# Patient Record
Sex: Female | Born: 1970 | Race: Black or African American | Hispanic: No | Smoking: Never smoker
Health system: Southern US, Community
[De-identification: ages and names within clinical notes are randomized; demographics above are authoritative.]

## PROBLEM LIST (undated history)

## (undated) DIAGNOSIS — K8020 Calculus of gallbladder without cholecystitis without obstruction: Secondary | ICD-10-CM

## (undated) DIAGNOSIS — K859 Acute pancreatitis without necrosis or infection, unspecified: Secondary | ICD-10-CM

## (undated) DIAGNOSIS — M549 Dorsalgia, unspecified: Secondary | ICD-10-CM

## (undated) DIAGNOSIS — R569 Unspecified convulsions: Secondary | ICD-10-CM

## (undated) DIAGNOSIS — F32A Depression, unspecified: Secondary | ICD-10-CM

## (undated) DIAGNOSIS — G8929 Other chronic pain: Secondary | ICD-10-CM

## (undated) DIAGNOSIS — F319 Bipolar disorder, unspecified: Secondary | ICD-10-CM

## (undated) DIAGNOSIS — F4310 Post-traumatic stress disorder, unspecified: Secondary | ICD-10-CM

## (undated) DIAGNOSIS — F1190 Opioid use, unspecified, uncomplicated: Secondary | ICD-10-CM

## (undated) DIAGNOSIS — F329 Major depressive disorder, single episode, unspecified: Secondary | ICD-10-CM

## (undated) DIAGNOSIS — J45909 Unspecified asthma, uncomplicated: Secondary | ICD-10-CM

## (undated) HISTORY — DX: Opioid use, unspecified, uncomplicated: F11.90

## (undated) HISTORY — DX: Major depressive disorder, single episode, unspecified: F32.9

## (undated) HISTORY — DX: Depression, unspecified: F32.A

## (undated) HISTORY — DX: Unspecified convulsions: R56.9

## (undated) HISTORY — PX: CHOLECYSTECTOMY: SHX55

## (undated) HISTORY — PX: TUBAL LIGATION: SHX77

---

## 1998-12-30 HISTORY — PX: ELBOW SURGERY: SHX618

## 2004-09-05 ENCOUNTER — Other Ambulatory Visit: Payer: Self-pay

## 2005-06-09 ENCOUNTER — Emergency Department: Payer: Self-pay | Admitting: Emergency Medicine

## 2005-06-19 ENCOUNTER — Emergency Department: Payer: Self-pay | Admitting: Emergency Medicine

## 2005-07-09 ENCOUNTER — Ambulatory Visit: Payer: Self-pay

## 2005-08-26 ENCOUNTER — Emergency Department: Payer: Self-pay | Admitting: Emergency Medicine

## 2005-08-27 ENCOUNTER — Ambulatory Visit: Payer: Self-pay | Admitting: Emergency Medicine

## 2005-09-29 ENCOUNTER — Ambulatory Visit: Payer: Self-pay

## 2006-01-04 ENCOUNTER — Observation Stay: Payer: Self-pay | Admitting: Obstetrics and Gynecology

## 2006-01-27 ENCOUNTER — Inpatient Hospital Stay: Payer: Self-pay | Admitting: Obstetrics and Gynecology

## 2007-01-26 ENCOUNTER — Emergency Department: Payer: Self-pay | Admitting: Internal Medicine

## 2007-11-12 ENCOUNTER — Emergency Department: Payer: Self-pay

## 2007-11-15 ENCOUNTER — Emergency Department: Payer: Self-pay | Admitting: Emergency Medicine

## 2008-06-21 ENCOUNTER — Other Ambulatory Visit: Payer: Self-pay

## 2008-06-21 ENCOUNTER — Emergency Department: Payer: Self-pay

## 2008-12-15 ENCOUNTER — Emergency Department: Payer: Self-pay | Admitting: Emergency Medicine

## 2009-01-21 ENCOUNTER — Emergency Department: Payer: Self-pay | Admitting: Emergency Medicine

## 2009-02-24 ENCOUNTER — Emergency Department: Payer: Self-pay | Admitting: Emergency Medicine

## 2009-03-10 ENCOUNTER — Ambulatory Visit: Payer: Self-pay | Admitting: Rheumatology

## 2009-03-24 ENCOUNTER — Ambulatory Visit: Payer: Self-pay | Admitting: Orthopedic Surgery

## 2009-03-29 ENCOUNTER — Ambulatory Visit: Payer: Self-pay | Admitting: Orthopedic Surgery

## 2009-07-18 ENCOUNTER — Encounter: Payer: Self-pay | Admitting: Orthopedic Surgery

## 2009-07-30 ENCOUNTER — Encounter: Payer: Self-pay | Admitting: Orthopedic Surgery

## 2009-08-09 ENCOUNTER — Emergency Department: Payer: Self-pay | Admitting: Emergency Medicine

## 2009-08-12 ENCOUNTER — Emergency Department: Payer: Self-pay | Admitting: Emergency Medicine

## 2009-08-14 ENCOUNTER — Ambulatory Visit: Payer: Self-pay | Admitting: Vascular Surgery

## 2009-08-17 ENCOUNTER — Ambulatory Visit: Payer: Self-pay | Admitting: Vascular Surgery

## 2009-08-30 ENCOUNTER — Encounter: Payer: Self-pay | Admitting: Orthopedic Surgery

## 2010-06-09 ENCOUNTER — Emergency Department: Payer: Self-pay | Admitting: Emergency Medicine

## 2010-07-04 ENCOUNTER — Emergency Department: Payer: Self-pay | Admitting: Emergency Medicine

## 2010-10-31 ENCOUNTER — Emergency Department: Payer: Self-pay | Admitting: Emergency Medicine

## 2011-04-11 ENCOUNTER — Emergency Department: Payer: Self-pay | Admitting: Emergency Medicine

## 2012-01-23 ENCOUNTER — Emergency Department: Payer: Self-pay | Admitting: Unknown Physician Specialty

## 2012-01-23 LAB — CBC
HCT: 37.3 % (ref 35.0–47.0)
MCV: 91 fL (ref 80–100)
RBC: 4.09 10*6/uL (ref 3.80–5.20)
RDW: 14.6 % — ABNORMAL HIGH (ref 11.5–14.5)

## 2012-01-23 LAB — COMPREHENSIVE METABOLIC PANEL
Albumin: 3.8 g/dL (ref 3.4–5.0)
Alkaline Phosphatase: 55 U/L (ref 50–136)
BUN: 6 mg/dL — ABNORMAL LOW (ref 7–18)
Chloride: 106 mmol/L (ref 98–107)
Creatinine: 0.77 mg/dL (ref 0.60–1.30)
Glucose: 90 mg/dL (ref 65–99)
Osmolality: 280 (ref 275–301)
Potassium: 3.7 mmol/L (ref 3.5–5.1)
SGPT (ALT): 20 U/L
Sodium: 142 mmol/L (ref 136–145)
Total Protein: 7.7 g/dL (ref 6.4–8.2)

## 2012-01-23 LAB — URINALYSIS, COMPLETE
Bilirubin,UR: NEGATIVE
Ketone: NEGATIVE
Leukocyte Esterase: NEGATIVE
Nitrite: NEGATIVE
Ph: 7 (ref 4.5–8.0)
Protein: NEGATIVE
RBC,UR: 3 /HPF (ref 0–5)
WBC UR: 1 /HPF (ref 0–5)

## 2012-01-23 LAB — LIPASE, BLOOD: Lipase: 88 U/L (ref 73–393)

## 2012-01-23 LAB — PREGNANCY, URINE: Pregnancy Test, Urine: NEGATIVE m[IU]/mL

## 2012-01-23 LAB — MAGNESIUM: Magnesium: 1.7 mg/dL — ABNORMAL LOW

## 2012-12-28 ENCOUNTER — Emergency Department: Payer: Self-pay | Admitting: Emergency Medicine

## 2013-05-07 ENCOUNTER — Emergency Department: Payer: Self-pay | Admitting: Emergency Medicine

## 2013-05-18 ENCOUNTER — Emergency Department: Payer: Self-pay | Admitting: Emergency Medicine

## 2013-06-12 ENCOUNTER — Emergency Department: Payer: Self-pay | Admitting: Internal Medicine

## 2013-08-15 ENCOUNTER — Emergency Department: Payer: Self-pay | Admitting: Emergency Medicine

## 2013-08-16 ENCOUNTER — Ambulatory Visit: Payer: Self-pay | Admitting: Primary Care

## 2013-09-07 ENCOUNTER — Ambulatory Visit: Payer: Self-pay | Admitting: Primary Care

## 2013-09-08 ENCOUNTER — Emergency Department: Payer: Self-pay | Admitting: Emergency Medicine

## 2013-09-08 LAB — URINALYSIS, COMPLETE
Bacteria: NONE SEEN
Bilirubin,UR: NEGATIVE
Glucose,UR: NEGATIVE mg/dL
Leukocyte Esterase: NEGATIVE
Nitrite: NEGATIVE
Ph: 6
Protein: 30
RBC,UR: 9 /HPF
Specific Gravity: 1.032
Squamous Epithelial: 2
WBC UR: 1 /HPF

## 2013-10-11 ENCOUNTER — Emergency Department: Payer: Self-pay | Admitting: Emergency Medicine

## 2013-10-11 LAB — COMPREHENSIVE METABOLIC PANEL
Alkaline Phosphatase: 83 U/L (ref 50–136)
Anion Gap: 7 (ref 7–16)
Bilirubin,Total: 0.5 mg/dL (ref 0.2–1.0)
Calcium, Total: 10.2 mg/dL — ABNORMAL HIGH (ref 8.5–10.1)
Chloride: 102 mmol/L (ref 98–107)
Co2: 27 mmol/L (ref 21–32)
Creatinine: 0.78 mg/dL (ref 0.60–1.30)
Glucose: 94 mg/dL (ref 65–99)
Potassium: 3.5 mmol/L (ref 3.5–5.1)
SGOT(AST): 23 U/L (ref 15–37)
Sodium: 136 mmol/L (ref 136–145)
Total Protein: 8.4 g/dL — ABNORMAL HIGH (ref 6.4–8.2)

## 2013-10-11 LAB — CBC
HCT: 38.4 % (ref 35.0–47.0)
MCHC: 34.4 g/dL (ref 32.0–36.0)
MCV: 89 fL (ref 80–100)
Platelet: 424 10*3/uL (ref 150–440)
RBC: 4.31 10*6/uL (ref 3.80–5.20)
RDW: 15.2 % — ABNORMAL HIGH (ref 11.5–14.5)
WBC: 8.5 10*3/uL (ref 3.6–11.0)

## 2013-10-11 LAB — URINALYSIS, COMPLETE
Leukocyte Esterase: NEGATIVE
Nitrite: NEGATIVE
Protein: 30
RBC,UR: 8 /HPF (ref 0–5)
WBC UR: 4 /HPF (ref 0–5)

## 2013-10-12 LAB — TROPONIN I: Troponin-I: <0.02 ng/mL

## 2013-10-13 LAB — URINE CULTURE

## 2013-10-14 ENCOUNTER — Ambulatory Visit: Payer: Self-pay | Admitting: Physical Medicine and Rehabilitation

## 2014-03-24 ENCOUNTER — Emergency Department: Payer: Self-pay | Admitting: Emergency Medicine

## 2014-03-24 LAB — COMPREHENSIVE METABOLIC PANEL
ALK PHOS: 57 U/L
ALT: 16 U/L (ref 12–78)
AST: 10 U/L — AB (ref 15–37)
Albumin: 3.9 g/dL (ref 3.4–5.0)
Anion Gap: 4 — ABNORMAL LOW (ref 7–16)
BILIRUBIN TOTAL: 0.4 mg/dL (ref 0.2–1.0)
BUN: 9 mg/dL (ref 7–18)
CALCIUM: 9.3 mg/dL (ref 8.5–10.1)
CHLORIDE: 103 mmol/L (ref 98–107)
Co2: 27 mmol/L (ref 21–32)
Creatinine: 0.89 mg/dL (ref 0.60–1.30)
EGFR (African American): >60
EGFR (Non-African Amer.): >60
GLUCOSE: 88 mg/dL (ref 65–99)
OSMOLALITY: 266 (ref 275–301)
POTASSIUM: 3.4 mmol/L — AB (ref 3.5–5.1)
SODIUM: 134 mmol/L — AB (ref 136–145)
TOTAL PROTEIN: 7.9 g/dL (ref 6.4–8.2)

## 2014-03-24 LAB — URINALYSIS, COMPLETE
Bilirubin,UR: NEGATIVE
Glucose,UR: NEGATIVE mg/dL (ref 0–75)
KETONE: NEGATIVE
Nitrite: NEGATIVE
Ph: 7 (ref 4.5–8.0)
Protein: NEGATIVE
RBC,UR: 1 /HPF (ref 0–5)
Specific Gravity: 1.004 (ref 1.003–1.030)
Squamous Epithelial: <1

## 2014-03-24 LAB — CBC
HCT: 36.7 % (ref 35.0–47.0)
HGB: 12.2 g/dL (ref 12.0–16.0)
MCH: 29.6 pg (ref 26.0–34.0)
MCHC: 33.2 g/dL (ref 32.0–36.0)
MCV: 89 fL (ref 80–100)
Platelet: 341 10*3/uL (ref 150–440)
RBC: 4.13 10*6/uL (ref 3.80–5.20)
RDW: 15.3 % — ABNORMAL HIGH (ref 11.5–14.5)
WBC: 7.5 10*3/uL (ref 3.6–11.0)

## 2014-12-08 ENCOUNTER — Emergency Department: Payer: Self-pay | Admitting: Student

## 2015-02-22 ENCOUNTER — Emergency Department: Payer: Self-pay | Admitting: Emergency Medicine

## 2015-06-22 ENCOUNTER — Other Ambulatory Visit: Payer: Self-pay | Admitting: Physician Assistant

## 2015-06-22 DIAGNOSIS — N643 Galactorrhea not associated with childbirth: Secondary | ICD-10-CM

## 2015-06-28 ENCOUNTER — Ambulatory Visit: Payer: Medicaid Other

## 2015-06-28 ENCOUNTER — Ambulatory Visit
Admission: RE | Admit: 2015-06-28 | Discharge: 2015-06-28 | Disposition: A | Discharge status: Home / Self Care | Payer: Medicaid Other | Source: Ambulatory Visit | Attending: Physician Assistant | Admitting: Physician Assistant

## 2015-06-28 ENCOUNTER — Ambulatory Visit: Payer: Self-pay

## 2015-06-28 DIAGNOSIS — N644 Mastodynia: Secondary | ICD-10-CM | POA: Insufficient documentation

## 2015-06-28 DIAGNOSIS — N643 Galactorrhea not associated with childbirth: Secondary | ICD-10-CM

## 2015-06-28 DIAGNOSIS — O926 Galactorrhea: Secondary | ICD-10-CM | POA: Diagnosis present

## 2015-07-01 ENCOUNTER — Encounter: Payer: Self-pay | Admitting: Emergency Medicine

## 2015-07-01 ENCOUNTER — Emergency Department
Admission: EM | Admit: 2015-07-01 | Discharge: 2015-07-01 | Disposition: A | Discharge status: Home / Self Care | Payer: Medicaid Other | Attending: Emergency Medicine | Admitting: Emergency Medicine

## 2015-07-01 DIAGNOSIS — F319 Bipolar disorder, unspecified: Secondary | ICD-10-CM | POA: Diagnosis not present

## 2015-07-01 DIAGNOSIS — Z79899 Other long term (current) drug therapy: Secondary | ICD-10-CM | POA: Insufficient documentation

## 2015-07-01 DIAGNOSIS — R51 Headache: Secondary | ICD-10-CM | POA: Insufficient documentation

## 2015-07-01 DIAGNOSIS — R519 Headache, unspecified: Secondary | ICD-10-CM

## 2015-07-01 DIAGNOSIS — Z880 Allergy status to penicillin: Secondary | ICD-10-CM | POA: Insufficient documentation

## 2015-07-01 DIAGNOSIS — F4310 Post-traumatic stress disorder, unspecified: Secondary | ICD-10-CM | POA: Diagnosis not present

## 2015-07-01 HISTORY — DX: Post-traumatic stress disorder, unspecified: F43.10

## 2015-07-01 HISTORY — DX: Bipolar disorder, unspecified: F31.9

## 2015-07-01 MED ORDER — BUTALBITAL-APAP-CAFFEINE 50-325-40 MG PO TABS
ORAL_TABLET | ORAL | Status: AC
  Administered 2015-07-01: 1 via ORAL
  Filled 2015-07-01: qty 1

## 2015-07-01 MED ORDER — KETOROLAC TROMETHAMINE 60 MG/2ML IM SOLN
60.0000 mg | Freq: Once | INTRAMUSCULAR | Status: AC
  Administered 2015-07-01: 60 mg via INTRAMUSCULAR

## 2015-07-01 MED ORDER — KETOROLAC TROMETHAMINE 60 MG/2ML IM SOLN
INTRAMUSCULAR | Status: AC
  Administered 2015-07-01: 60 mg via INTRAMUSCULAR
  Filled 2015-07-01: qty 2

## 2015-07-01 MED ORDER — BUTALBITAL-APAP-CAFFEINE 50-325-40 MG PO TABS
1.0000 | ORAL_TABLET | Freq: Four times a day (QID) | ORAL | Status: AC | PRN
Start: 2015-07-01 — End: 2016-06-30

## 2015-07-01 MED ORDER — KETOROLAC TROMETHAMINE 10 MG PO TABS: 10.0000 mg | ORAL_TABLET | Freq: Four times a day (QID) | ORAL | Status: DC | PRN

## 2015-07-01 MED ORDER — BUTALBITAL-APAP-CAFFEINE 50-325-40 MG PO TABS
1.0000 | ORAL_TABLET | Freq: Once | ORAL | Status: AC
  Administered 2015-07-01: 1 via ORAL

## 2015-07-01 NOTE — ED Notes (Signed)
NAD noted at time of D/C. Pt denies questions or concerns. Pt ambulatory to the lobby at this time. Pt refused wheelchair at this time.  

## 2015-07-01 NOTE — ED Notes (Signed)
Patient presents to the ED with a headache for 2 hours.  Patient states about 1.5 hours ago she noticed 2 red ticks on her and she pulled them off her right arm.  Patient is concerned that ticks and headache are related.  Patient is slightly anxious but is ambulatory and oriented x 4.

## 2015-07-01 NOTE — Discharge Instructions (Signed)
Follow up with your PCP if the headaches continue. Return to the ER for symptoms that change or worsen if you are unable to schedule an appointment.

## 2015-07-01 NOTE — ED Provider Notes (Signed)
[facility] Emergency Department Provider Note ___________________________________________  Time seen: Approximately 4:00 PM  I have reviewed the triage vital signs and the nursing notes.   HISTORY  Chief Complaint Headache  HPI Sierra Wiley is a 44 y.o. female who presents to the emergency department for a headache over the past 2 hours. She states that they were walking in the woods to go fishing and she noticed a red tick on her right forearm that had just started to embed underneath the skin. She states a second take was removed from her left lower leg that had embedded itself. She reports that she has never had a headache like this in the past. The headache is in the front and feels band like.She reports a recent change in her medications. She was taken off of her Remeron and placed on temazepam and Abilify. She states that her first dose of the temazepam was last night. She denies change in vision. She denies confusion. She denies nausea or vomiting.   Past Medical History  Diagnosis Date  . Bipolar 1 disorder   . PTSD (post-traumatic stress disorder)     Patient Active Problem List   Diagnosis Date Noted  . Bipolar 1 disorder 07/01/2015  . Post traumatic stress disorder 07/01/2015    History reviewed. Wiley pertinent past surgical history.  Current Outpatient Rx  Name  Route  Sig  Dispense  Refill  . ARIPiprazole (ABILIFY) 10 MG tablet   Oral   Take 10 mg by mouth daily.         . citalopram (CELEXA) 10 MG tablet   Oral   Take 10 mg by mouth daily.         . temazepam (RESTORIL) 15 MG capsule   Oral   Take 15 mg by mouth at bedtime as needed for sleep.         . butalbital-acetaminophen-caffeine (FIORICET) 50-325-40 MG per tablet   Oral   Take 1 tablet by mouth every 6 (six) hours as needed for headache.   6 tablet   0   . ketorolac (TORADOL) 10 MG tablet   Oral   Take 1 tablet (10 mg total) by mouth every 6 (six) hours as  needed.   20 tablet   0     Allergies Penicillins  Wiley family history on file.  Social History History  Substance Use Topics  . Smoking status: Not on file  . Smokeless tobacco: Not on file  . Alcohol Use: Not on file    Review of Systems Constitutional: Wiley fever/chills Eyes: Wiley visual changes. ENT: Wiley sore throat. Cardiovascular: Denies chest pain. Respiratory: Denies shortness of breath. Gastrointestinal: Wiley abdominal pain.  Wiley nausea, Wiley vomiting.  Wiley diarrhea.  Wiley constipation. Genitourinary: Negative for dysuria. Musculoskeletal: Negative for back pain. Skin: Negative for rash. Neurological: Negative for headaches, focal weakness or numbness.  10-point ROS otherwise negative.  ____________________________________________   PHYSICAL EXAM:  VITAL SIGNS: ED Triage Vitals  Enc Vitals Group     BP 07/01/15 1528 123/82 mmHg     Pulse Rate 07/01/15 1528 73     Resp 07/01/15 1528 16     Temp 07/01/15 1528 98.6 F (37 C)     Temp Source 07/01/15 1528 Oral     SpO2 07/01/15 1528 98 %     Weight 07/01/15 1528 166 lb (75.297 kg)     Height 07/01/15 1528  (1.6 m)     Head Cir --  Peak Flow --      Pain Score 07/01/15 1530 10     Pain Loc --      Pain Edu? --      Excl. in GC? --     Constitutional: Alert and oriented. Well appearing and in Wiley acute distress. Eyes: Conjunctivae are normal. PERRL. EOMI. Head: Atraumatic. Nose: Wiley congestion/rhinnorhea. Mouth/Throat: Mucous membranes are moist.  Oropharynx non-erythematous. Neck: Wiley stridor.   Cardiovascular: Normal rate, regular rhythm. Grossly normal heart sounds.  Good peripheral circulation. Respiratory: Normal respiratory effort.  Wiley retractions. Lungs CTAB. Gastrointestinal: Soft and nontender. Wiley distention. Wiley abdominal bruits. Wiley CVA tenderness. Musculoskeletal: Wiley lower extremity tenderness nor edema.  Wiley joint effusions. Neurologic:  Normal speech and language. Wiley gross focal neurologic  deficits are appreciated. Speech is normal. Wiley gait instability. Romberg negative, finger to nose test negative. Skin:  Skin is warm and dry. Wiley rash noted. Pinpoint abrasion present on right forearm where she "pulled the tick out before it got all the way in."  Psychiatric: Flat affect. Speech and behavior are normal.  ____________________________________________   LABS (all labs ordered are listed, but only abnormal results are displayed)  Labs Reviewed - Wiley data to display ____________________________________________  EKG   ____________________________________________  RADIOLOGY  Not indicated. ____________________________________________   PROCEDURES  Procedure(s) performed: None  Critical Care performed: Wiley  ____________________________________________   INITIAL IMPRESSION / ASSESSMENT AND PLAN / ED COURSE  Pertinent labs & imaging results that were available during my care of the patient were reviewed by me and considered in my medical decision making (see chart for details).  IM Toradol and PO Fioricet given in ER with complete relief of headache. She was advised to follow up with her provider if the headaches continue/return. Possibly related to recent changes in medications/new medications. ____________________________________________   FINAL CLINICAL IMPRESSION(S) / ED DIAGNOSES  Final diagnoses:  Frontal headache      Chinita PesterCari B Phares Zaccone, FNP 07/01/15 1701  Arnaldo NatalPaul F Malinda, MD 07/02/15 0005

## 2015-09-02 ENCOUNTER — Encounter: Payer: Self-pay | Admitting: *Deleted

## 2015-09-02 ENCOUNTER — Emergency Department
Admission: EM | Admit: 2015-09-02 | Discharge: 2015-09-02 | Disposition: A | Discharge status: Home / Self Care | Payer: Medicaid Other | Attending: Emergency Medicine | Admitting: Emergency Medicine

## 2015-09-02 DIAGNOSIS — M5137 Other intervertebral disc degeneration, lumbosacral region: Secondary | ICD-10-CM | POA: Diagnosis not present

## 2015-09-02 DIAGNOSIS — M545 Low back pain: Secondary | ICD-10-CM | POA: Insufficient documentation

## 2015-09-02 DIAGNOSIS — Z79899 Other long term (current) drug therapy: Secondary | ICD-10-CM | POA: Diagnosis not present

## 2015-09-02 DIAGNOSIS — Z880 Allergy status to penicillin: Secondary | ICD-10-CM | POA: Diagnosis not present

## 2015-09-02 DIAGNOSIS — G8929 Other chronic pain: Secondary | ICD-10-CM | POA: Diagnosis not present

## 2015-09-02 DIAGNOSIS — M5416 Radiculopathy, lumbar region: Secondary | ICD-10-CM

## 2015-09-02 HISTORY — DX: Unspecified asthma, uncomplicated: J45.909

## 2015-09-02 MED ORDER — KETOROLAC TROMETHAMINE 10 MG PO TABS: 10.0000 mg | ORAL_TABLET | Freq: Three times a day (TID) | ORAL | Status: DC

## 2015-09-02 MED ORDER — ORPHENADRINE CITRATE 30 MG/ML IJ SOLN
60.0000 mg | INTRAMUSCULAR | Status: AC
  Administered 2015-09-02: 60 mg via INTRAMUSCULAR
  Filled 2015-09-02: qty 2

## 2015-09-02 MED ORDER — KETOROLAC TROMETHAMINE 60 MG/2ML IM SOLN
60.0000 mg | Freq: Once | INTRAMUSCULAR | Status: AC
  Administered 2015-09-02: 60 mg via INTRAMUSCULAR
  Filled 2015-09-02: qty 2

## 2015-09-02 NOTE — ED Notes (Signed)
Pt states she has a bad lower back with buldging discs. Pt states after moving a tv yesterday she now has severe pain in her lower back

## 2015-09-02 NOTE — Discharge Instructions (Signed)
Chronic Back Pain  When back pain lasts longer than 3 months, it is called chronic back pain.People with chronic back pain often go through certain periods that are more intense (flare-ups).  CAUSES Chronic back pain can be caused by wear and tear (degeneration) on different structures in your back. These structures include:  The bones of your spine (vertebrae) and the joints surrounding your spinal cord and nerve roots (facets).  The strong, fibrous tissues that connect your vertebrae (ligaments). Degeneration of these structures may result in pressure on your nerves. This can lead to constant pain. HOME CARE INSTRUCTIONS  Avoid bending, heavy lifting, prolonged sitting, and activities which make the problem worse.  Take brief periods of rest throughout the day to reduce your pain. Lying down or standing usually is better than sitting while you are resting.  Take over-the-counter or prescription medicines only as directed by your caregiver. SEEK IMMEDIATE MEDICAL CARE IF:   You have weakness or numbness in one of your legs or feet.  You have trouble controlling your bladder or bowels.  You have nausea, vomiting, abdominal pain, shortness of breath, or fainting. Document Released: 01/23/2005 Document Revised: 03/09/2012 Document Reviewed: 11/30/2011 [facility] Patient Information 2015 [place], [place]. This information is not intended to replace advice given to you by your health care provider. Make sure you discuss any questions you have with your health care provider.  Lumbosacral Radiculopathy Lumbosacral radiculopathy is a pinched nerve or nerves in the low back (lumbosacral area). When this happens you may have weakness in your legs and may not be able to stand on your toes. You may have pain going down into your legs. There may be difficulties with walking normally. There are many causes of this problem. Sometimes this may happen from an injury, or simply from arthritis or boney  problems. It may also be caused by other illnesses such as diabetes. If there is no improvement after treatment, further studies may be done to find the exact cause. DIAGNOSIS  X-rays may be needed if the problems become long standing. Electromyograms may be done. This study is one in which the working of nerves and muscles is studied. HOME CARE INSTRUCTIONS   Applications of ice packs may be helpful. Ice can be used in a plastic bag with a towel around it to prevent frostbite to skin. This may be used every 2 hours for 20 to 30 minutes, or as needed, while awake, or as directed by your caregiver.  Only take over-the-counter or prescription medicines for pain, discomfort, or fever as directed by your caregiver.  If physical therapy was prescribed, follow your caregiver's directions. SEEK IMMEDIATE MEDICAL CARE IF:   You have pain not controlled with medications.  You seem to be getting worse rather than better.  You develop increasing weakness in your legs.  You develop loss of bowel or bladder control.  You have difficulty with walking or balance, or develop clumsiness in the use of your legs.  You have a fever. MAKE SURE YOU:   Understand these instructions.  Will watch your condition.  Will get help right away if you are not doing well or get worse. Document Released: 12/16/2005 Document Revised: 03/09/2012 Document Reviewed: 08/05/2008 [facility] Patient Information 2015 [place], [place]. This information is not intended to replace advice given to you by your health care provider. Make sure you discuss any questions you have with your health care provider.  Continue your home meds as directed.  Follow-up with [facility] as needed.

## 2015-09-02 NOTE — ED Provider Notes (Signed)
[facility] Emergency Department Provider Note ____________________________________________  Time seen: 1322  I have reviewed the triage vital signs and the nursing notes.  HISTORY  Chief Complaint  Back Pain  HPI Sierra Wiley is a 44 y.o. female since to the ED for evaluation of acute back pain on top of her chronic lumbar degenerative disc disease, after moving a television yesterday. She describes some low back pain which is severe in nature.She is here primarily because she is awaiting a referral to a pain management specialist in [place] who takes her insurance. That referral is being managed by [facility]. In the interim she has been managed by her psychiatrist with her home meds including tizanidine. She denies any bladder or bowel incontinence, but describes pain to the right lower extremity and in the left hip which is her baseline.  Past Medical History  Diagnosis Date  . Bipolar 1 disorder   . PTSD (post-traumatic stress disorder)   . Asthma     Patient Active Problem List   Diagnosis Date Noted  . Bipolar 1 disorder 07/01/2015  . Post traumatic stress disorder 07/01/2015    No past surgical history on file.  Current Outpatient Rx  Name  Route  Sig  Dispense  Refill  . ARIPiprazole (ABILIFY) 10 MG tablet   Oral   Take 10 mg by mouth daily.         . butalbital-acetaminophen-caffeine (FIORICET) 50-325-40 MG per tablet   Oral   Take 1 tablet by mouth every 6 (six) hours as needed for headache.   6 tablet   0   . citalopram (CELEXA) 10 MG tablet   Oral   Take 10 mg by mouth daily.         Marland Kitchen ketorolac (TORADOL) 10 MG tablet   Oral   Take 1 tablet (10 mg total) by mouth every 8 (eight) hours.   15 tablet   0   . temazepam (RESTORIL) 15 MG capsule   Oral   Take 15 mg by mouth at bedtime as needed for sleep.          Allergies Penicillins  No family history on file.  Social History Social History   Substance Use Topics  . Smoking status: Former Games developer  . Smokeless tobacco: None  . Alcohol Use: No   Review of Systems  Constitutional: Negative for fever. Eyes: Negative for visual changes. ENT: Negative for sore throat. Cardiovascular: Negative for chest pain. Respiratory: Negative for shortness of breath. Gastrointestinal: Negative for abdominal pain, vomiting and diarrhea. Genitourinary: Negative for dysuria. Musculoskeletal: Positive for back pain as above Skin: Negative for rash. Neurological: Negative for headaches, focal weakness or numbness. ____________________________________________  PHYSICAL EXAM:  VITAL SIGNS: ED Triage Vitals  Enc Vitals Group     BP 09/02/15 1304 136/81 mmHg     Pulse Rate 09/02/15 1304 84     Resp 09/02/15 1304 17     Temp 09/02/15 1304 97.8 F (36.6 C)     Temp Source 09/02/15 1304 Oral     SpO2 09/02/15 1304 100 %     Weight 09/02/15 1304 178 lb (80.74 kg)     Height 09/02/15 1304 5\' 2"  (1.575 m)     Head Cir --      Peak Flow --      Pain Score 09/02/15 1305 9     Pain Loc --      Pain Edu? --      Excl.  in GC? --    Constitutional: Alert and oriented. Well appearing and in no distress. Eyes: Conjunctivae are normal. PERRL. Normal extraocular movements. ENT   Head: Normocephalic and atraumatic.   Nose: No congestion/rhinnorhea.   Mouth/Throat: Mucous membranes are moist.   Neck: Supple. No thyromegaly. Hematological/Lymphatic/Immunilogical: No cervical lymphadenopathy. Cardiovascular: Normal rate, regular rhythm.  Respiratory: Normal respiratory effort. No wheezes/rales/rhonchi. Gastrointestinal: Soft and nontender. No distention. Musculoskeletal: Normal spinal alignment without spasm, deformity, or step-off. Nontender with normal range of motion in all extremities.  Neurologic:  CN II-XII grossly intact. Normal LE DTRs bilaterally. Normal gait without ataxia. Normal speech and language. No gross focal neurologic  deficits are appreciated. Skin:  Skin is warm, dry and intact. No rash noted. Psychiatric: Mood and affect are normal. Patient exhibits appropriate insight and judgment. __________________________   RADIOLOGY MRI (07/2013) L4-5 HNP with annular bulge L5-S1 HNP  ____________________________________________  PROCEDURES  Toradol 60 mg IM Norflex 60 mg IM ____________________________________________  INITIAL IMPRESSION / ASSESSMENT AND PLAN / ED COURSE  Patient reports with a flare of her chronic back pain over the last she had but most recently the last few weeks. She is awaiting a referral to pain specialist for further management of her symptoms. She will be treated with Toradol # 15 addition to her previously prescribed home meds. She'll follow-up at [facility] clinic as needed or return to the ED for worsening symptoms. ____________________________________________  FINAL CLINICAL IMPRESSION(S) / ED DIAGNOSES  Final diagnoses:  DDD (degenerative disc disease), lumbosacral  Chronic radicular pain of lower back     Lissa Hoard, PA-C 09/02/15 1524  Myrna Blazer, MD 09/02/15 [phone]

## 2015-10-04 ENCOUNTER — Emergency Department
Admission: EM | Admit: 2015-10-04 | Discharge: 2015-10-04 | Disposition: A | Discharge status: Home / Self Care | Payer: Medicaid Other | Attending: Emergency Medicine | Admitting: Emergency Medicine

## 2015-10-04 ENCOUNTER — Encounter: Payer: Self-pay | Admitting: Emergency Medicine

## 2015-10-04 ENCOUNTER — Emergency Department: Payer: Medicaid Other

## 2015-10-04 DIAGNOSIS — Z87891 Personal history of nicotine dependence: Secondary | ICD-10-CM | POA: Insufficient documentation

## 2015-10-04 DIAGNOSIS — Y998 Other external cause status: Secondary | ICD-10-CM | POA: Insufficient documentation

## 2015-10-04 DIAGNOSIS — Z880 Allergy status to penicillin: Secondary | ICD-10-CM | POA: Diagnosis not present

## 2015-10-04 DIAGNOSIS — Y9285 Railroad track as the place of occurrence of the external cause: Secondary | ICD-10-CM | POA: Insufficient documentation

## 2015-10-04 DIAGNOSIS — Z79899 Other long term (current) drug therapy: Secondary | ICD-10-CM | POA: Diagnosis not present

## 2015-10-04 DIAGNOSIS — W010XXA Fall on same level from slipping, tripping and stumbling without subsequent striking against object, initial encounter: Secondary | ICD-10-CM | POA: Diagnosis not present

## 2015-10-04 DIAGNOSIS — Y9389 Activity, other specified: Secondary | ICD-10-CM | POA: Insufficient documentation

## 2015-10-04 DIAGNOSIS — S8001XA Contusion of right knee, initial encounter: Secondary | ICD-10-CM

## 2015-10-04 DIAGNOSIS — S5001XA Contusion of right elbow, initial encounter: Secondary | ICD-10-CM | POA: Diagnosis not present

## 2015-10-04 DIAGNOSIS — S59901A Unspecified injury of right elbow, initial encounter: Secondary | ICD-10-CM | POA: Diagnosis present

## 2015-10-04 DIAGNOSIS — S8002XA Contusion of left knee, initial encounter: Secondary | ICD-10-CM | POA: Diagnosis not present

## 2015-10-04 MED ORDER — MELOXICAM 15 MG PO TABS: 15.0000 mg | ORAL_TABLET | Freq: Every day | ORAL | Status: DC

## 2015-10-04 NOTE — ED Provider Notes (Signed)
[facility] Emergency Department Provider Note ____________________________________________  Time seen: Approximately 4:12 PM  I have reviewed the triage vital signs and the nursing notes.   HISTORY  Chief Complaint Fall   HPI Sierra Wiley is a 44 y.o. female who presents to the emergency department for evaluation of right elbow and knee pain after [place] earlier [place]. She states she tripped because of her shoe. She has not taken anything for pain.   Past Medical History  Diagnosis Date  . Bipolar 1 disorder (HCC)   . PTSD (post-traumatic stress disorder)   . Asthma     Patient Active Problem List   Diagnosis Date Noted  . Bipolar 1 disorder (HCC) 07/01/2015  . Post traumatic stress disorder 07/01/2015    History reviewed. No pertinent past surgical history.  Current Outpatient Rx  Name  Route  Sig  Dispense  Refill  . clonazePAM (KLONOPIN) 0.5 MG tablet   Oral   Take 0.5 mg by mouth 2 (two) times daily as needed for anxiety.         . lamoTRIgine (LAMICTAL) 100 MG tablet   Oral   Take 100 mg by mouth daily.         Marland Kitchen lurasidone (LATUDA) 40 MG TABS tablet   Oral   Take 20 mg by mouth daily with breakfast.         . ARIPiprazole (ABILIFY) 10 MG tablet   Oral   Take 10 mg by mouth daily.         . butalbital-acetaminophen-caffeine (FIORICET) 50-325-40 MG per tablet   Oral   Take 1 tablet by mouth every 6 (six) hours as needed for headache.   6 tablet   0   . citalopram (CELEXA) 10 MG tablet   Oral   Take 10 mg by mouth daily.         . meloxicam (MOBIC) 15 MG tablet   Oral   Take 1 tablet (15 mg total) by mouth daily.   30 tablet   2   . temazepam (RESTORIL) 15 MG capsule   Oral   Take 15 mg by mouth at bedtime as needed for sleep.           Allergies Penicillins  History reviewed. No pertinent family history.  Social History Social History  Substance Use Topics  . Smoking  status: Former Games developer  . Smokeless tobacco: None  . Alcohol Use: No    Review of Systems Constitutional: No recent illness. Eyes: No visual changes. ENT: No sore throat. Cardiovascular: Denies chest pain or palpitations. Respiratory: Denies shortness of breath. Gastrointestinal: No abdominal pain.  Genitourinary: Negative for dysuria. Musculoskeletal: Pain in right elbow over the radial head. Skin: Negative for rash. Neurological: Negative for headaches, focal weakness or numbness. 10-point ROS otherwise negative.  ____________________________________________   PHYSICAL EXAM:  VITAL SIGNS: ED Triage Vitals  Enc Vitals Group     BP 10/04/15 1031 114/73 mmHg     Pulse Rate 10/04/15 1031 90     Resp 10/04/15 1031 20     Temp 10/04/15 1031 98 F (36.7 C)     Temp Source 10/04/15 1031 Oral     SpO2 10/04/15 1031 97 %     Weight 10/04/15 1031 185 lb (83.915 kg)     Height 10/04/15 1031  (1.575 m)     Head Cir --      Peak Flow --      Pain  Score 10/04/15 1032 8     Pain Loc --      Pain Edu? --      Excl. in GC? --     Constitutional: Alert and oriented. Well appearing and in no acute distress. Eyes: Conjunctivae are normal. EOMI. Head: Atraumatic. Nose: No congestion/rhinnorhea. Neck: No stridor.  Respiratory: Normal respiratory effort.   Musculoskeletal: Tenderness to palpation of the right elbow and anterior knees. Neurologic:  Normal speech and language. No gross focal neurologic deficits are appreciated. Speech is normal. No gait instability. Skin:  Skin is warm, dry and intact. Atraumatic.  Psychiatric: Mood and affect are normal. Speech and behavior are normal.  ____________________________________________   LABS (all labs ordered are listed, but only abnormal results are displayed)  Labs Reviewed - No data to display ____________________________________________  RADIOLOGY  Negative for fracture of the right  elbow. ____________________________________________   PROCEDURES  Procedure(s) performed:  Sling applied to the right arm.   ____________________________________________   INITIAL IMPRESSION / ASSESSMENT AND PLAN / ED COURSE  Pertinent labs & imaging results that were available during my care of the patient were reviewed by me and considered in my medical decision making (see chart for details).  Patient was advised to follow up with orthopedics for symptoms that are not iimproving over the next week. She was advised to return to the ER for symptoms that change or worsen if unable to schedule an appointment. ____________________________________________   FINAL CLINICAL IMPRESSION(S) / ED DIAGNOSES  Final diagnoses:  Elbow contusion, right, initial encounter  Knee contusion, left, initial encounter  Knee contusion, right, initial encounter       Chinita Pester, FNP 10/04/15 1619  Myrna Blazer, MD 10/04/15 [phone]

## 2015-10-04 NOTE — ED Notes (Signed)
Pt states fell on some railroad tracks this am injuring her right wrist and both elbows with an abrasion to right palm of hand with bandaid in place.

## 2015-10-04 NOTE — ED Notes (Signed)
NAD noted at time of D/C. Pt taken to lobby via wheelchair. Pt denies comments/concerns at this time.  

## 2015-10-04 NOTE — Discharge Instructions (Signed)
Elbow Contusion °An elbow contusion is a deep bruise of the elbow. Contusions are the result of an injury that caused bleeding under the skin. The contusion may turn blue, purple, or yellow. Minor injuries will give you a painless contusion, but more severe contusions may stay painful and swollen for a few weeks.  °CAUSES  °An elbow contusion comes from a direct force to that area, such as falling on the elbow. °SYMPTOMS  °· Swelling and redness of the elbow. °· Bruising of the elbow area. °· Tenderness or soreness of the elbow. °DIAGNOSIS  °You will have a physical exam and will be asked about your history. You may need an X-ray of your elbow to look for a broken bone (fracture).  °TREATMENT  °A sling or splint may be needed to support your injury. Resting, elevating, and applying cold compresses to the elbow area are often the best treatments for an elbow contusion. Over-the-counter medicines may also be recommended for pain control. °HOME CARE INSTRUCTIONS  °· Put ice on the injured area. °¨ Put ice in a plastic bag. °¨ Place a towel between your skin and the bag. °¨ Leave the ice on for 15-20 minutes, 03-04 times a day. °· Only take over-the-counter or prescription medicines for pain, discomfort, or fever as directed by your caregiver. °· Rest your injured elbow until the pain and swelling are better. °· Elevate your elbow to reduce swelling. °· Apply a compression wrap as directed by your caregiver. This can help reduce swelling and motion. You may remove the wrap for sleeping, showers, and baths. If your fingers become numb, cold, or blue, take the wrap off and reapply it more loosely. °· Use your elbow only as directed by your caregiver. You may be asked to do range of motion exercises. Do them as directed. °· See your caregiver as directed. It is very important to keep all follow-up appointments in order to avoid any long-term problems with your elbow, including chronic pain or inability to move your elbow  normally. °SEEK IMMEDIATE MEDICAL CARE IF:  °· You have increased redness, swelling, or pain in your elbow. °· Your swelling or pain is not relieved with medicines. °· You have swelling of the hand and fingers. °· You are unable to move your fingers or wrist. °· You begin to lose feeling in your hand or fingers. °· Your fingers or hand become cold or blue. °MAKE SURE YOU:  °· Understand these instructions. °· Will watch your condition. °· Will get help right away if you are not doing well or get worse. °  °This information is not intended to replace advice given to you by your health care provider. Make sure you discuss any questions you have with your health care provider. °  °Document Released: 11/24/2006 Document Revised: 03/09/2012 Document Reviewed: 07/31/2015 °[employer]vier Interactive Patient Education ©2016 [employer] ° °

## 2015-10-24 ENCOUNTER — Emergency Department: Payer: Medicaid Other

## 2015-10-24 ENCOUNTER — Encounter: Payer: Self-pay | Admitting: Emergency Medicine

## 2015-10-24 ENCOUNTER — Emergency Department
Admission: EM | Admit: 2015-10-24 | Discharge: 2015-10-24 | Disposition: A | Discharge status: Home / Self Care | Payer: Medicaid Other | Attending: Student | Admitting: Student

## 2015-10-24 DIAGNOSIS — S63501A Unspecified sprain of right wrist, initial encounter: Secondary | ICD-10-CM

## 2015-10-24 DIAGNOSIS — R110 Nausea: Secondary | ICD-10-CM | POA: Insufficient documentation

## 2015-10-24 DIAGNOSIS — Y9389 Activity, other specified: Secondary | ICD-10-CM | POA: Insufficient documentation

## 2015-10-24 DIAGNOSIS — M549 Dorsalgia, unspecified: Secondary | ICD-10-CM | POA: Insufficient documentation

## 2015-10-24 DIAGNOSIS — Z79899 Other long term (current) drug therapy: Secondary | ICD-10-CM | POA: Insufficient documentation

## 2015-10-24 DIAGNOSIS — Z87891 Personal history of nicotine dependence: Secondary | ICD-10-CM | POA: Diagnosis not present

## 2015-10-24 DIAGNOSIS — W1839XA Other fall on same level, initial encounter: Secondary | ICD-10-CM | POA: Diagnosis not present

## 2015-10-24 DIAGNOSIS — X58XXXA Exposure to other specified factors, initial encounter: Secondary | ICD-10-CM | POA: Diagnosis not present

## 2015-10-24 DIAGNOSIS — K5900 Constipation, unspecified: Secondary | ICD-10-CM | POA: Insufficient documentation

## 2015-10-24 DIAGNOSIS — Y998 Other external cause status: Secondary | ICD-10-CM | POA: Insufficient documentation

## 2015-10-24 DIAGNOSIS — Y9289 Other specified places as the place of occurrence of the external cause: Secondary | ICD-10-CM | POA: Diagnosis not present

## 2015-10-24 DIAGNOSIS — G8929 Other chronic pain: Secondary | ICD-10-CM | POA: Diagnosis not present

## 2015-10-24 DIAGNOSIS — Z791 Long term (current) use of non-steroidal anti-inflammatories (NSAID): Secondary | ICD-10-CM | POA: Insufficient documentation

## 2015-10-24 DIAGNOSIS — S6991XA Unspecified injury of right wrist, hand and finger(s), initial encounter: Secondary | ICD-10-CM | POA: Diagnosis present

## 2015-10-24 DIAGNOSIS — Z880 Allergy status to penicillin: Secondary | ICD-10-CM | POA: Diagnosis not present

## 2015-10-24 HISTORY — DX: Dorsalgia, unspecified: M54.9

## 2015-10-24 HISTORY — DX: Other chronic pain: G89.29

## 2015-10-24 NOTE — ED Notes (Signed)
States she fell about 1 week ago and had an injury to right arm. Now having pain to right wrist area w/o new injury positive pulses good circulation
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
## 2015-10-24 NOTE — Discharge Instructions (Signed)
Wear wrist wupport and follow up with scheduled Orthoappointment.

## 2015-10-24 NOTE — ED Notes (Signed)
Pt to ed with c/o right wrist pain after fall last week.  Pt states she was seen here when she fell but was her wrist was not evaluated

## 2015-10-31 ENCOUNTER — Other Ambulatory Visit: Payer: Self-pay | Admitting: Primary Care

## 2015-10-31 DIAGNOSIS — R519 Headache, unspecified: Secondary | ICD-10-CM

## 2015-10-31 DIAGNOSIS — R51 Headache: Secondary | ICD-10-CM

## 2015-10-31 DIAGNOSIS — R42 Dizziness and giddiness: Secondary | ICD-10-CM

## 2015-11-06 ENCOUNTER — Other Ambulatory Visit: Payer: Self-pay

## 2015-11-06 ENCOUNTER — Emergency Department
Admission: EM | Admit: 2015-11-06 | Discharge: 2015-11-06 | Disposition: A | Discharge status: Home / Self Care | Payer: Medicaid Other | Attending: Emergency Medicine | Admitting: Emergency Medicine

## 2015-11-06 DIAGNOSIS — F911 Conduct disorder, childhood-onset type: Secondary | ICD-10-CM | POA: Insufficient documentation

## 2015-11-06 DIAGNOSIS — R55 Syncope and collapse: Secondary | ICD-10-CM | POA: Insufficient documentation

## 2015-11-06 DIAGNOSIS — F4310 Post-traumatic stress disorder, unspecified: Secondary | ICD-10-CM | POA: Diagnosis present

## 2015-11-06 DIAGNOSIS — F319 Bipolar disorder, unspecified: Secondary | ICD-10-CM | POA: Diagnosis not present

## 2015-11-06 DIAGNOSIS — Y998 Other external cause status: Secondary | ICD-10-CM | POA: Diagnosis not present

## 2015-11-06 DIAGNOSIS — Y92009 Unspecified place in unspecified non-institutional (private) residence as the place of occurrence of the external cause: Secondary | ICD-10-CM | POA: Insufficient documentation

## 2015-11-06 DIAGNOSIS — F419 Anxiety disorder, unspecified: Secondary | ICD-10-CM | POA: Insufficient documentation

## 2015-11-06 DIAGNOSIS — Z880 Allergy status to penicillin: Secondary | ICD-10-CM | POA: Diagnosis not present

## 2015-11-06 DIAGNOSIS — F1310 Sedative, hypnotic or anxiolytic abuse, uncomplicated: Secondary | ICD-10-CM | POA: Diagnosis not present

## 2015-11-06 DIAGNOSIS — Z79899 Other long term (current) drug therapy: Secondary | ICD-10-CM | POA: Diagnosis not present

## 2015-11-06 DIAGNOSIS — Y9389 Activity, other specified: Secondary | ICD-10-CM | POA: Insufficient documentation

## 2015-11-06 DIAGNOSIS — Z87891 Personal history of nicotine dependence: Secondary | ICD-10-CM | POA: Insufficient documentation

## 2015-11-06 DIAGNOSIS — Z791 Long term (current) use of non-steroidal anti-inflammatories (NSAID): Secondary | ICD-10-CM | POA: Insufficient documentation

## 2015-11-06 DIAGNOSIS — S0990XA Unspecified injury of head, initial encounter: Secondary | ICD-10-CM | POA: Insufficient documentation

## 2015-11-06 DIAGNOSIS — W1839XA Other fall on same level, initial encounter: Secondary | ICD-10-CM | POA: Diagnosis not present

## 2015-11-06 DIAGNOSIS — J45909 Unspecified asthma, uncomplicated: Secondary | ICD-10-CM

## 2015-11-06 DIAGNOSIS — R4689 Other symptoms and signs involving appearance and behavior: Secondary | ICD-10-CM

## 2015-11-06 LAB — URINE DRUG SCREEN, QUALITATIVE (ARMC ONLY)
AMPHETAMINES, UR SCREEN: NOT DETECTED
BENZODIAZEPINE, UR SCRN: POSITIVE — AB
Barbiturates, Ur Screen: NOT DETECTED
CANNABINOID 50 NG, UR ~~LOC~~: NOT DETECTED
Cocaine Metabolite,Ur ~~LOC~~: NOT DETECTED
MDMA (Ecstasy)Ur Screen: NOT DETECTED
Methadone Scn, Ur: NOT DETECTED
Opiate, Ur Screen: NOT DETECTED
PHENCYCLIDINE (PCP) UR S: NOT DETECTED
Tricyclic, Ur Screen: NOT DETECTED

## 2015-11-06 LAB — COMPREHENSIVE METABOLIC PANEL
ALBUMIN: 4.6 g/dL (ref 3.5–5.0)
ALK PHOS: 40 U/L (ref 38–126)
ALT: 15 U/L (ref 14–54)
AST: 20 U/L (ref 15–41)
Anion gap: 5 (ref 5–15)
BILIRUBIN TOTAL: 0.8 mg/dL (ref 0.3–1.2)
BUN: 15 mg/dL (ref 6–20)
CO2: 24 mmol/L (ref 22–32)
CREATININE: 0.87 mg/dL (ref 0.44–1.00)
Calcium: 9.7 mg/dL (ref 8.9–10.3)
Chloride: 105 mmol/L (ref 101–111)
GFR calc Af Amer: >60 mL/min (ref >60)
GFR calc non Af Amer: >60 mL/min (ref >60)
GLUCOSE: 93 mg/dL (ref 65–99)
POTASSIUM: 3.5 mmol/L (ref 3.5–5.1)
Sodium: 134 mmol/L — ABNORMAL LOW (ref 135–145)
TOTAL PROTEIN: 8.2 g/dL — AB (ref 6.5–8.1)

## 2015-11-06 LAB — CBC WITH DIFFERENTIAL/PLATELET
BASOS ABS: 0 10*3/uL (ref 0–0.1)
BASOS PCT: 0 %
Eosinophils Absolute: 0 10*3/uL (ref 0–0.7)
Eosinophils Relative: 1 %
HEMATOCRIT: 40.8 % (ref 35.0–47.0)
HEMOGLOBIN: 13.4 g/dL (ref 12.0–16.0)
LYMPHS PCT: 32 %
Lymphs Abs: 2.4 10*3/uL (ref 1.0–3.6)
MCH: 30.1 pg (ref 26.0–34.0)
MCHC: 32.7 g/dL (ref 32.0–36.0)
MCV: 92.2 fL (ref 80.0–100.0)
Monocytes Absolute: 0.5 10*3/uL (ref 0.2–0.9)
Monocytes Relative: 7 %
NEUTROS ABS: 4.5 10*3/uL (ref 1.4–6.5)
NEUTROS PCT: 60 %
Platelets: 277 10*3/uL (ref 150–440)
RBC: 4.43 MIL/uL (ref 3.80–5.20)
RDW: 14.6 % — AB (ref 11.5–14.5)
WBC: 7.4 10*3/uL (ref 3.6–11.0)

## 2015-11-06 LAB — URINALYSIS COMPLETE WITH MICROSCOPIC (ARMC ONLY)
Bacteria, UA: NONE SEEN
Bilirubin Urine: NEGATIVE
Glucose, UA: NEGATIVE mg/dL
KETONES UR: NEGATIVE mg/dL
Leukocytes, UA: NEGATIVE
Nitrite: NEGATIVE
PH: 5 (ref 5.0–8.0)
PROTEIN: NEGATIVE mg/dL
SPECIFIC GRAVITY, URINE: 1.013 (ref 1.005–1.030)

## 2015-11-06 LAB — ACETAMINOPHEN LEVEL: Acetaminophen (Tylenol), Serum: <10 ug/mL — ABNORMAL LOW (ref 10–30)

## 2015-11-06 LAB — ETHANOL: Alcohol, Ethyl (B): <5 mg/dL (ref <5)

## 2015-11-06 LAB — SALICYLATE LEVEL: Salicylate Lvl: <4 mg/dL (ref 2.8–30.0)

## 2015-11-06 NOTE — ED Notes (Signed)
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
## 2015-11-06 NOTE — ED Notes (Signed)
Report received from RN Amy T  Pt in room. No complaints or concerns voiced at this time. No abnormal behavior noted at this time. Will continue to monitor with q15 min checks. ODS officer in area. 

## 2015-11-06 NOTE — ED Notes (Signed)
Pt to ED BHU at this time
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
## 2015-11-06 NOTE — ED Notes (Signed)
BEHAVIORAL HEALTH ROUNDING Patient sleeping: No. Patient alert and oriented: yes Behavior appropriate: Yes.  ; If no, describe:  Nutrition and fluids offered: yes Toileting and hygiene offered: Yes  Sitter present: q15 minute observations and security camera monitoring Law enforcement present: Yes  ODS  
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
## 2015-11-06 NOTE — ED Notes (Signed)
She is currently talking on the phone  

## 2015-11-10 ENCOUNTER — Ambulatory Visit: Payer: Medicaid Other

## 2015-11-10 ENCOUNTER — Ambulatory Visit
Admission: RE | Admit: 2015-11-10 | Discharge: 2015-11-10 | Disposition: A | Discharge status: Home / Self Care | Payer: Medicaid Other | Source: Ambulatory Visit | Attending: Primary Care | Admitting: Primary Care

## 2015-11-10 DIAGNOSIS — R519 Headache, unspecified: Secondary | ICD-10-CM

## 2015-11-10 DIAGNOSIS — R42 Dizziness and giddiness: Secondary | ICD-10-CM

## 2015-11-10 DIAGNOSIS — R51 Headache: Secondary | ICD-10-CM | POA: Insufficient documentation

## 2015-11-10 MED ORDER — IOHEXOL 300 MG/ML  SOLN
75.0000 mL | Freq: Once | INTRAMUSCULAR | Status: AC | PRN
  Administered 2015-11-10: 75 mL via INTRAVENOUS

## 2016-01-08 ENCOUNTER — Ambulatory Visit: Payer: Medicaid Other | Admitting: Physical Therapy

## 2016-01-15 ENCOUNTER — Ambulatory Visit: Payer: Medicaid Other | Admitting: Physical Therapy

## 2016-01-17 ENCOUNTER — Ambulatory Visit: Payer: Medicaid Other | Admitting: Occupational Therapy

## 2016-02-21 ENCOUNTER — Encounter (INDEPENDENT_AMBULATORY_CARE_PROVIDER_SITE_OTHER): Payer: Self-pay

## 2016-02-21 ENCOUNTER — Ambulatory Visit
Admission: RE | Admit: 2016-02-21 | Discharge: 2016-02-21 | Disposition: A | Discharge status: Home / Self Care | Payer: Medicaid Other | Source: Ambulatory Visit | Attending: Pain Medicine | Admitting: Pain Medicine

## 2016-02-21 ENCOUNTER — Ambulatory Visit (HOSPITAL_BASED_OUTPATIENT_CLINIC_OR_DEPARTMENT_OTHER): Payer: Medicaid Other | Admitting: Pain Medicine

## 2016-02-21 ENCOUNTER — Encounter: Payer: Self-pay | Admitting: Pain Medicine

## 2016-02-21 VITALS — BP 124/79 | HR 69 | Temp 99.0°F | Resp 16 | Ht 63.0 in | Wt 171.0 lb

## 2016-02-21 DIAGNOSIS — Z5181 Encounter for therapeutic drug level monitoring: Secondary | ICD-10-CM

## 2016-02-21 DIAGNOSIS — R937 Abnormal findings on diagnostic imaging of other parts of musculoskeletal system: Secondary | ICD-10-CM

## 2016-02-21 DIAGNOSIS — M542 Cervicalgia: Principal | ICD-10-CM

## 2016-02-21 DIAGNOSIS — M533 Sacrococcygeal disorders, not elsewhere classified: Secondary | ICD-10-CM | POA: Diagnosis not present

## 2016-02-21 DIAGNOSIS — M545 Low back pain, unspecified: Secondary | ICD-10-CM

## 2016-02-21 DIAGNOSIS — Z5189 Encounter for other specified aftercare: Secondary | ICD-10-CM

## 2016-02-21 DIAGNOSIS — M5106 Intervertebral disc disorders with myelopathy, lumbar region: Secondary | ICD-10-CM | POA: Insufficient documentation

## 2016-02-21 DIAGNOSIS — G8929 Other chronic pain: Secondary | ICD-10-CM

## 2016-02-21 DIAGNOSIS — M79601 Pain in right arm: Secondary | ICD-10-CM | POA: Insufficient documentation

## 2016-02-21 DIAGNOSIS — R935 Abnormal findings on diagnostic imaging of other abdominal regions, including retroperitoneum: Secondary | ICD-10-CM

## 2016-02-21 DIAGNOSIS — R52 Pain, unspecified: Secondary | ICD-10-CM

## 2016-02-21 DIAGNOSIS — Z79891 Long term (current) use of opiate analgesic: Secondary | ICD-10-CM

## 2016-02-21 DIAGNOSIS — M79606 Pain in leg, unspecified: Secondary | ICD-10-CM | POA: Diagnosis not present

## 2016-02-21 DIAGNOSIS — F1190 Opioid use, unspecified, uncomplicated: Secondary | ICD-10-CM

## 2016-02-21 DIAGNOSIS — Z79899 Other long term (current) drug therapy: Secondary | ICD-10-CM

## 2016-02-21 DIAGNOSIS — Z0189 Encounter for other specified special examinations: Secondary | ICD-10-CM

## 2016-02-21 DIAGNOSIS — M25521 Pain in right elbow: Secondary | ICD-10-CM

## 2016-02-21 HISTORY — DX: Opioid use, unspecified, uncomplicated: F11.90

## 2016-02-21 LAB — COMPREHENSIVE METABOLIC PANEL
ALT: 17 U/L (ref 14–54)
AST: 19 U/L (ref 15–41)
Albumin: 4.4 g/dL (ref 3.5–5.0)
Alkaline Phosphatase: 36 U/L — ABNORMAL LOW (ref 38–126)
Anion gap: 5 (ref 5–15)
BILIRUBIN TOTAL: 0.3 mg/dL (ref 0.3–1.2)
BUN: 10 mg/dL (ref 6–20)
CHLORIDE: 105 mmol/L (ref 101–111)
CO2: 27 mmol/L (ref 22–32)
CREATININE: 0.75 mg/dL (ref 0.44–1.00)
Calcium: 9.2 mg/dL (ref 8.9–10.3)
GFR calc Af Amer: >60 mL/min (ref >60)
GLUCOSE: 91 mg/dL (ref 65–99)
Potassium: 4.1 mmol/L (ref 3.5–5.1)
Sodium: 137 mmol/L (ref 135–145)
Total Protein: 7.4 g/dL (ref 6.5–8.1)

## 2016-02-21 LAB — MAGNESIUM: Magnesium: 1.8 mg/dL (ref 1.7–2.4)

## 2016-02-21 LAB — SEDIMENTATION RATE: Sed Rate: 9 mm/hr (ref 0–20)

## 2016-02-21 LAB — C-REACTIVE PROTEIN

## 2016-02-21 NOTE — Progress Notes (Signed)
Patient's Name: Sierra Wiley MRN: [id number] DOB: 07-19-1971 DOS: 02/21/2016  Primary Reason(s) for Visit: Initial Patient Evaluation CC: Back Pain and Elbow Pain   HPI  Sierra Wiley is a 45 y.o. year old, female patient, who comes today for an initial evaluation. She has Bipolar 1 disorder (HCC); Post traumatic stress disorder; Asthma; Chronic pain; Chronic low back pain (Location of Primary Source of Pain) (Bilateral) (R>L); Sacrococcygeal pain; Chronic lower extremity pain (Location of Secondary source of pain) (Bilateral) (R>L); Chronic elbow pain (Right); Long term current use of opiate analgesic; Long term prescription opiate use; Opiate use; Encounter for therapeutic drug level monitoring; Encounter for pain management planning; Chronic neck pain (Right); Chronic upper extremity pain (Right); Abnormal MRI, lumbar spine (08/16/2013); Abnormal MRI, hip (10/14/2013); and Pain management on her problem list.. Her primarily concern today is the Back Pain and Elbow Pain    The patient comes into our practice today for the first time for an evaluation. According to the patient, her primary source of pain is in the lower back with the right being worst on the left. This pain seems to go to the tailbone area. Following this, her second worst pain is that of the lower extremities with the right being worst on the left. In the case of the right lower extremity the pain stops at the level of the knee over the lateral aspect of the knee. In the case of the left lower extremity the pain goes down only to the upper thigh, anteriorly. The third worst pain is in the area of the right elbow where the patient indicates having had a fracture around the year 2000. The patient had a surgery to remove some loose bodies in that same elbow, around the year 2002 by Dr. Gerrit Heck. The next area of pain is that of the neck where she indicates having pain in the back of the neck around the midline and going towards the right  side. This is followed by pain in the area of the right shoulder that travels down into the middle 3 fingers in her right hand.  The patient indicates having had some interventional pain management treatments in the form of injections into the right elbow and an injection into the lower back which was done by [facility]. [facility] physiatrist wrist Dr. Yves Dill. The patient described those injections as ineffective.  The patient indicates having been to the [facility]  Reported Pain Score: 7 , clinically she looks like a 1/10. Reported level is inconsistent with clinical obrservations. Pain Type: Chronic pain Pain Location: Back (left elbow) Pain Orientation: Lower Pain Descriptors / Indicators: Radiating, Constant (excruciating) Pain Frequency: Constant  Onset and Duration: Sudden, Date of injury: 04/13/2002 and Present longer than 3 months Cause of pain: Trauma Severity: No change since onset, NAS-11 at its worse: 8/10, NAS-11 now: 8/10 and NAS-11 on the average: 8/10 Timing: Morning, Afternoon, Night, During activity or exercise and After activity or exercise Aggravating Factors: Bending, Motion, Nerve blocks, Prolonged sitting, Prolonged standing, Squatting, Stooping , Surgery made it worse, Twisting, Walking, Walking uphill, Walking downhill and Working Alleviating Factors: Bending, Cold packs, Hot packs, Lying down, Nerve blocks, Resting, Sitting, Sleeping, Using a brace, Warm showers or baths and Physical therapy Associated Problems: Day-time cramps, Night-time cramps, Dizziness, Inability to control bladder (urine), Pain that wakes patient up and Pain that does not allow patient to sleep Quality of Pain: Aching, Cramping, Heavy, Sharp, Shooting and Tender Previous Examinations or Tests: MRI scan, Nerve block  and X-rays Previous Treatments: Physical Therapy  Historic Controlled Substance Pharmacotherapy Review  Previously Prescribed Opioids:  Analgesic: We are currently not  prescribing any medications for this patient but history cleaned she indicates having taken hydrocodone/APAP. However, the pmp indicates that she has been taking Percocet 5/325 one tablet by mouth every 8 hours when necessary for pain. (15 mg/day) MME/day: 22.5 mg/day Pharmacokinetics: Onset of action (Liberation/Absorption): Within expected pharmacological parameters. (2 hours) Time to Peak effect (Distribution): Timing and results are as within normal expected parameters. (4 hours) Duration of action (Metabolism/Excretion): Within normal limits for medication. (6 hours) Pharmacodynamics: Analgesic Effect: 95% Activity Facilitation: Medication(s) allow patient to sit, stand, walk, and do the basic ADLs Perceived Effectiveness: Described as relatively effective, allowing for increase in activities of daily living (ADL) Side-effects or Adverse reactions: None reported Historical Background Evaluation: [place] PDMP: Five (5) year initial data search conducted. Historical Hospital-associated UDS Results:   Lab Results  Component Value Date   THCU NONE DETECTED 11/06/2015   PCPSCRNUR NONE DETECTED 11/06/2015   MDMA NONE DETECTED 11/06/2015   AMPHETMU NONE DETECTED 11/06/2015   METHADONE NONE DETECTED 11/06/2015   UDS Results: No UDS available, at this time UDS Interpretation: No UDS available, at this time Medication Assessment Form: Not applicable. Initial evaluation. The patient has not received any medications from our practice Treatment compliance: Not applicable. Initial evaluation Risk Assessment: Aberrant Behavior:  Substance Use Disorder (SUD) Risk Level: Pending results of Medical Psychology Evaluation for SUD Opioid Risk Tool (ORT) Score: Total Score: 10 Depression Scale Score:    Pharmacologic Plan: Pending ordered tests and/or consults  Neuromodulation Therapy Review  Type: No neuromodulatory devices implanted Side-effects or Adverse reactions: No device  reported Effectiveness: No device reported  Allergies  Ms. Hesse is allergic to penicillins.  Meds  The patient has a current medication list which includes the following prescription(s): aripiprazole, butalbital-acetaminophen-caffeine, citalopram, clonazepam, lamotrigine, lurasidone, meloxicam, omeprazole, phentermine, sertraline, temazepam, and tizanidine. Requested Prescriptions    No prescriptions requested or ordered in this encounter    ROS  Cardiovascular History: Negative for hypertension, coronary artery diseas, myocardial infraction, anticoagulant therapy or heart failure Pulmonary or Respiratory History: Asthma and Shortness of breath Neurological History: Negative for epilepsy, stroke, urinary or fecal inontinence, spina bifida or tethered cord syndrome Psychological-Psychiatric History: Psychiatric disorder Gastrointestinal History: Reflux or heatburn Genitourinary History: Negative for nephrolithiasis, hematuria, renal failure or chronic kidney disease Hematological History: Negative for anticoagulant therapy, anemia, bruising or bleeding easily, hemophilia, sickle cell disease or trait, thrombocytopenia or coagulupathies Endocrine History: Negative for diabetes or thyroid disease Rheumatologic History: Negative for lupus, osteoarthritis, rheumatoid arthritis, myositis, polymyositis or fibromyagia Musculoskeletal History: Negative for myasthenia gravis, muscular dystrophy, multiple sclerosis or malignant hyperthermia Work History: Working full time  [employer]  Medical:  Ms. Crume  has a past medical history of Bipolar 1 disorder (HCC); PTSD (post-traumatic stress disorder); Asthma; Chronic back pain; Depression; Seizures (HCC); and Opiate use (02/21/2016). Family: family history includes Alcohol abuse in her mother and sister; Bipolar disorder in her mother; Cancer in her father; Cirrhosis in her mother; Drug abuse in her mother and sister; HIV in her mother; Heart disease in  her father. Surgical:  has past surgical history that includes Elbow surgery (Left, 2000); Tubal ligation; and Cholecystectomy. Tobacco:  reports that she has never smoked. She does not have any smokeless tobacco history on file. Alcohol:  reports that she does not drink alcohol. Drug:  reports that she does not use illicit drugs.  Physical Exam  Vitals:  Today's Vitals   02/21/16 1205  BP: 124/79  Pulse: 69  Temp: 99 F (37.2 C)  TempSrc: Oral  Resp: 16  Height: 5\' 3"  (1.6 m)  Weight: 171 lb (77.565 kg)  SpO2: 100%  PainSc: 7     Calculated BMI: Body mass index is 30.3 kg/(m^2).  General appearance: alert, cooperative, appears stated age and no distress Eyes: PERLA Respiratory: No evidence respiratory distress, no audible rales or ronchi and no use of accessory muscles of respiration  Cervical Spine Inspection: Normal anatomy Alignment: Symetrical ROM: Adequate Palpation: WNL Provocative Tests: Spurling's Foraminal Stenosis Test: deferred Hoffman's Cervical Myelopathy Test: deferred Lhermitte Sign (Cord Compression/Myelopathy Test):  deferred  Upper Extremities Inspection: No gross anomalies detected ROM: Adequate Sensory: Normal Motor: Unremarkable  Thoracic Spine Inspection: No gross anomalies detected Alignment: Symetrical ROM: Adequate Palpation: WNL  Lumbar Spine Inspection: No gross anomalies detected Alignment: Symetrical ROM: Decreased  Gait: WNL  Lower Extremities Inspection: No gross anomalies detected ROM: Adequate Sensory: Normal Motor: Unremarkable  Toe walk (S1): WNL  Heal walk (L5): WNL   Assessment  Primary Diagnosis & Pertinent Problem List: The primary encounter diagnosis was Chronic pain. Diagnoses of Chronic low back pain (Location of Primary Source of Pain) (Bilateral) (R>L), Sacrococcygeal pain, Chronic pain of lower extremity, unspecified laterality, Chronic elbow pain (Right), Long term current use of opiate analgesic, Long  term prescription opiate use, Opiate use, Encounter for therapeutic drug level monitoring, Encounter for pain management planning, Chronic neck pain, Chronic upper extremity pain (Right), Abnormal MRI, lumbar spine (08/16/2013), Abnormal MRI, hip (10/14/2013), and Pain management were also pertinent to this visit.  Visit Diagnosis: 1. Chronic pain   2. Chronic low back pain (Location of Primary Source of Pain) (Bilateral) (R>L)   3. Sacrococcygeal pain   4. Chronic pain of lower extremity, unspecified laterality   5. Chronic elbow pain (Right)   6. Long term current use of opiate analgesic   7. Long term prescription opiate use   8. Opiate use   9. Encounter for therapeutic drug level monitoring   10. Encounter for pain management planning   11. Chronic neck pain   12. Chronic upper extremity pain (Right)   13. Abnormal MRI, lumbar spine (08/16/2013)   14. Abnormal MRI, hip (10/14/2013)   15. Pain management     Assessment: No problem-specific assessment & plan notes found for this encounter.   Plan of Care  Note: As per protocol, today's visit has been an evaluation only. We have not taken over the patient's controlled substance management.  Pharmacotherapy (Medications Ordered): No orders of the defined types were placed in this encounter.    Lab-work & Procedure Ordered: Orders Placed This Encounter  Procedures  . DG Cervical Spine Complete    Standing Status: Future     Number of Occurrences: 1     Standing Expiration Date: 02/20/2017    Order Specific Question:  Reason for Exam (SYMPTOM  OR DIAGNOSIS REQUIRED)    Answer:  Neck pain. Cervical spine pain.    Order Specific Question:  Is the patient pregnant?    Answer:  No    Order Specific Question:  Preferred imaging location?    Answer:  [facility]    Order Specific Question:  Call Results- Best Contact Number?    Answer:  [phone] (Pain Clinic facility) (Dr. Laban Emperor)  . DG Lumbar Spine Complete W/Bend     Standing Status: Future  Number of Occurrences: 1     Standing Expiration Date: 02/20/2017    Scheduling Instructions:     Please include flexion and extension views and report any spinal instability (>4 mm displacement of any spondylolisthesis). If present, please report any spondylolisthesis grade, as well as displacement in millimeters.    Order Specific Question:  Reason for Exam (SYMPTOM  OR DIAGNOSIS REQUIRED)    Answer:  Low back pain    Order Specific Question:  Is the patient pregnant?    Answer:  No    Order Specific Question:  Preferred imaging location?    Answer:  [facility]    Order Specific Question:  Call Results- Best Contact Number?    Answer:  [phone] (Pain Clinic facility) (Dr. Laban Emperor)  . ToxASSURE Select 13 (MW), Urine    Volume: 30 ml(s). Minimum 3 ml of urine is needed. Document temperature of fresh sample. Indications: Long term (current) use of opiate analgesic (Z79.891)  . Comprehensive metabolic panel    Order Specific Question:  Has the patient fasted?    Answer:  No  . C-reactive protein  . Magnesium  . Sedimentation rate  . Vitamin B12    Indication: Bone Pain (M89.9)  . Vitamin D pnl(25-hydrxy+1,25-dihy)-bld  . Ambulatory referral to Psychology    Referral Priority:  Routine    Referral Type:  Psychiatric    Referral Reason:  Specialty Services Required    Referred to Provider:  Lance Coon, PHD    Requested Specialty:  Psychology    Number of Visits Requested:  1    Imaging Ordered: AMB REFERRAL TO PSYCHOLOGY  Interventional Therapies: Scheduled: None at this time. PRN Procedures: Diagnostic, bilateral, lumbar facet block under fluoroscopic guidance and IV sedation.    Referral(s) or Consult(s): None at this time.  Medications administered during this visit: Ms. Hartsough does not currently have medications on file.  No future appointments.  Primary Care Physician: Sandrea Hughs, NP Location: [facility]  Outpatient Pain Management Facility Note by: Para March A. Laban Emperor, M.D, DABA, DABAPM, DABPM, DABIPP, FIPP

## 2016-02-21 NOTE — Progress Notes (Signed)
Patient coming here d/t ease of being closer to home after being a patient in [place] pain clinic. Dedra Skeens, PA at [facility] recommended you.  Patient has multiple mental health issues for which she is being treated by psychiatry.

## 2016-02-21 NOTE — Patient Instructions (Addendum)
Instructed to get labwork drawn at Pre admit testing today, and xrays at the medical mall.

## 2016-02-27 NOTE — Progress Notes (Signed)
Quick Note:  The results of this diagnostic imaging were reviewed and found to be within normal limits. No acute injury or pathology identified. There appears to be no major pathology requiring urgent or emergency care.  IMPRESSION: No acute fracture or subluxation. Minimal degenerative changes.  Treatment alternatives: Physical therapy 2-3 times a week 6-8 weeks. Should that fail to help the patient with the neck pain then consider cervical epidural steroid injections for the upper extremity and neck discomfort versus cervical facet blocks under fluoroscopic guidance and IV sedation. ______

## 2016-02-27 NOTE — Progress Notes (Signed)
Quick Note:  Lab results reviewed and found to be within normal limits. ______ 

## 2016-02-27 NOTE — Progress Notes (Signed)
Quick Note:   Low levels of ALP may be seen temporarily after blood transfusions or heart bypass surgery. A deficiency in zinc may cause decreased levels. A rare genetic disorder of bone metabolism called hypophosphatasia can cause severe, protracted low levels of ALP. Malnutrition or protein deficiency as well as Wilson disease could also be possible causes for lowered ALP.  ______

## 2016-03-12 ENCOUNTER — Other Ambulatory Visit: Payer: Self-pay

## 2016-04-10 ENCOUNTER — Emergency Department
Admission: EM | Admit: 2016-04-10 | Discharge: 2016-04-10 | Disposition: A | Discharge status: Home / Self Care | Payer: Self-pay | Attending: Emergency Medicine | Admitting: Emergency Medicine

## 2016-04-10 ENCOUNTER — Emergency Department: Payer: Self-pay

## 2016-04-10 ENCOUNTER — Encounter: Payer: Self-pay | Admitting: Emergency Medicine

## 2016-04-10 DIAGNOSIS — Y999 Unspecified external cause status: Secondary | ICD-10-CM | POA: Insufficient documentation

## 2016-04-10 DIAGNOSIS — J45909 Unspecified asthma, uncomplicated: Secondary | ICD-10-CM | POA: Insufficient documentation

## 2016-04-10 DIAGNOSIS — Z79899 Other long term (current) drug therapy: Secondary | ICD-10-CM | POA: Insufficient documentation

## 2016-04-10 DIAGNOSIS — F319 Bipolar disorder, unspecified: Secondary | ICD-10-CM | POA: Insufficient documentation

## 2016-04-10 DIAGNOSIS — R52 Pain, unspecified: Secondary | ICD-10-CM

## 2016-04-10 DIAGNOSIS — Y929 Unspecified place or not applicable: Secondary | ICD-10-CM | POA: Insufficient documentation

## 2016-04-10 DIAGNOSIS — W208XXA Other cause of strike by thrown, projected or falling object, initial encounter: Secondary | ICD-10-CM | POA: Insufficient documentation

## 2016-04-10 DIAGNOSIS — S90112A Contusion of left great toe without damage to nail, initial encounter: Secondary | ICD-10-CM | POA: Insufficient documentation

## 2016-04-10 DIAGNOSIS — Z79891 Long term (current) use of opiate analgesic: Secondary | ICD-10-CM | POA: Insufficient documentation

## 2016-04-10 DIAGNOSIS — G40909 Epilepsy, unspecified, not intractable, without status epilepticus: Secondary | ICD-10-CM | POA: Insufficient documentation

## 2016-04-10 DIAGNOSIS — Y939 Activity, unspecified: Secondary | ICD-10-CM | POA: Insufficient documentation

## 2016-04-10 DIAGNOSIS — E119 Type 2 diabetes mellitus without complications: Secondary | ICD-10-CM | POA: Insufficient documentation

## 2016-04-10 MED ORDER — NAPROXEN 500 MG PO TABS: 500.0000 mg | ORAL_TABLET | Freq: Two times a day (BID) | ORAL | Status: DC

## 2016-04-10 MED ORDER — LIDOCAINE-EPINEPHRINE-TETRACAINE (LET) SOLUTION: 3.0000 mL | Freq: Once | NASAL | Status: DC

## 2016-04-10 NOTE — ED Provider Notes (Signed)
[facility] Emergency Department Provider Note  ____________________________________________  Time seen: Approximately 8:26 PM  I have reviewed the triage vital signs and the nursing notes.   HISTORY  Chief Complaint Toe Pain    HPI Sierra Wiley is a 45 y.o. female , NAD, reports to the emergency room with 3 month history of left great toe pain. States she dropped something on the toe and has had numbness and pain since that time. States she is diabetic but is not on any medications for diabetes. Last A1c was somewhere in the 6 range per her primary care provider. Has not seen her primary care for her current symptoms. Denies any tingling or weakness. No falls. Denies chest pain, shortness of breath, back pain. Has not had any redness, swelling or skin sores of the lower extremity.   Past Medical History  Diagnosis Date  . Bipolar 1 disorder (HCC)   . PTSD (post-traumatic stress disorder)   . Asthma   . Chronic back pain   . Depression   . Seizures (HCC)   . Opiate use 02/21/2016    Patient Active Problem List   Diagnosis Date Noted  . Chronic pain 02/21/2016  . Chronic low back pain (Location of Primary Source of Pain) (Bilateral) (R>L) 02/21/2016  . Sacrococcygeal pain 02/21/2016  . Chronic lower extremity pain (Location of Secondary source of pain) (Bilateral) (R>L) 02/21/2016  . Chronic elbow pain (Right) 02/21/2016  . Long term current use of opiate analgesic 02/21/2016  . Long term prescription opiate use 02/21/2016  . Opiate use 02/21/2016  . Encounter for therapeutic drug level monitoring 02/21/2016  . Encounter for pain management planning 02/21/2016  . Chronic neck pain (Right) 02/21/2016  . Chronic upper extremity pain (Right) 02/21/2016  . Abnormal MRI, lumbar spine (08/16/2013) 02/21/2016  . Abnormal MRI, hip (10/14/2013) 02/21/2016  . Pain management 02/21/2016  . Asthma 11/06/2015  . Bipolar 1 disorder (HCC) 07/01/2015  . Post  traumatic stress disorder 07/01/2015    Past Surgical History  Procedure Laterality Date  . Elbow surgery Left 2000  . Tubal ligation    . Cholecystectomy      Current Outpatient Rx  Name  Route  Sig  Dispense  Refill  . ARIPiprazole (ABILIFY) 10 MG tablet   Oral   Take 10 mg by mouth daily.         . butalbital-acetaminophen-caffeine (FIORICET) 50-325-40 MG per tablet   Oral   Take 1 tablet by mouth every 6 (six) hours as needed for headache.   6 tablet   0   . citalopram (CELEXA) 10 MG tablet   Oral   Take 10 mg by mouth daily.         . clonazePAM (KLONOPIN) 0.5 MG tablet   Oral   Take 0.5 mg by mouth 2 (two) times daily as needed for anxiety.         . lamoTRIgine (LAMICTAL) 100 MG tablet   Oral   Take 100 mg by mouth daily.         Marland Kitchen. lurasidone (LATUDA) 40 MG TABS tablet   Oral   Take 20 mg by mouth daily with breakfast.         . meloxicam (MOBIC) 15 MG tablet   Oral   Take 1 tablet (15 mg total) by mouth daily.   30 tablet   2   . naproxen (NAPROSYN) 500 MG tablet   Oral   Take 1 tablet (500 mg total)  by mouth 2 (two) times daily with a meal.   14 tablet   0   . omeprazole (PRILOSEC OTC) 20 MG tablet   Oral   Take 20 mg by mouth daily.         . phentermine 15 MG capsule   Oral   Take 15 mg by mouth once.         . sertraline (ZOLOFT) 50 MG tablet   Oral   Take 50 mg by mouth daily.         . temazepam (RESTORIL) 15 MG capsule   Oral   Take 15 mg by mouth at bedtime as needed for sleep.         Marland Kitchen tiZANidine (ZANAFLEX) 4 MG capsule   Oral   Take 4 mg by mouth 3 (three) times daily.           Allergies Penicillins  Family History  Problem Relation Age of Onset  . Alcohol abuse Mother   . Drug abuse Mother   . HIV Mother   . Bipolar disorder Mother   . Cirrhosis Mother   . Cancer Father   . Heart disease Father   . Drug abuse Sister   . Alcohol abuse Sister     Social History Social History  Substance  Use Topics  . Smoking status: Never Smoker   . Smokeless tobacco: None  . Alcohol Use: No     Review of Systems  Constitutional: No fever/chills, fatigue Eyes: No visual changes.  Cardiovascular: No chest pain. Respiratory: No cough. No shortness of breath. No wheezing.  Gastrointestinal: No abdominal pain.  No nausea, vomiting. Musculoskeletal: Negative for back , hip, lower extremity pain.  Skin: Negative for rash, redness, swelling, skin sores. Neurological: Negative for headaches, focal weakness or numbness. Numbness about left great toe area. 10-point ROS otherwise negative.  ____________________________________________   PHYSICAL EXAM:  VITAL SIGNS: ED Triage Vitals  Enc Vitals Group     BP 04/10/16 1856 139/77 mmHg     Pulse Rate 04/10/16 1856 79     Resp 04/10/16 1856 18     Temp 04/10/16 1856 98.9 F (37.2 C)     Temp Source 04/10/16 1856 Oral     SpO2 04/10/16 1856 98 %     Weight 04/10/16 1856 173 lb (78.472 kg)     Height 04/10/16 1856  (1.6 m)     Head Cir --      Peak Flow --      Pain Score 04/10/16 1857 9     Pain Loc --      Pain Edu? --      Excl. in GC? --     Constitutional: Alert and oriented. Well appearing and in no acute distress. Eyes: Conjunctivae are normal. PERRL. EOMI without pain.  Head: Atraumatic. Cardiovascular: Normal rate, regular rhythm. Normal S1 and S2.  Slightly decreased peripheral circulation noted with 1+ pulses in bilateral lower extremities. Feet are cool to touch but equal In temperature bilaterally. Capillary refill less than 3 seconds. Respiratory: Normal respiratory effort without tachypnea or retractions. Lungs CTAB. Musculoskeletal: Full range of motion of bilateral ankles, feet, toes. No pain with manipulation of the left great toe. No crepitus. No lower extremity tenderness nor edema.  No joint effusions. Neurologic:  Normal speech and language. No gross focal neurologic deficits are appreciated.  Skin:  Skin  about bilateral lower extremities is slightly shiny and without hair. Patient notes no hair growth in  these areas. Skin is dry and intact. No rash, redness, warmth, swelling noted. Psychiatric: Mood and affect are normal. Speech and behavior are normal. Patient exhibits appropriate insight and judgement.   ____________________________________________   LABS  None  ____________________________________________  EKG  None ____________________________________________  RADIOLOGY I have personally viewed and evaluated these images (plain radiographs) as part of my medical decision making, as well as reviewing the written report by the radiologist.  US Venous Img Lower Unilateral Left  04/10/2016  CLINICAL DATA:  Pain and numbness for 3 months. EXAM: Left LOWER EXTREMITY VENOUS DOPPLER ULTRASOUND TECHNIQUE: Gray-scale sonography with graded compression, as well as color Doppler and duplex ultrasound were performed to evaluate the lower extremity deep venous systems from the level of the common femoral vein and including the common femoral, femoral, profunda femoral, popliteal and calf veins including the posterior tibial, peroneal and gastrocnemius veins when visible. The superficial great saphenous vein was also interrogated. Spectral Doppler was utilized to evaluate flow at rest and with distal augmentation maneuvers in the common femoral, femoral and popliteal veins. COMPARISON:  None. FINDINGS: Contralateral Common Femoral Vein: Respiratory phasicity is normal and symmetric with the symptomatic side. No evidence of thrombus. Normal compressibility. Common Femoral Vein: No evidence of thrombus. Normal compressibility, respiratory phasicity and response to augmentation. Saphenofemoral Junction: No evidence of thrombus. Normal compressibility and flow on color Doppler imaging. Profunda Femoral Vein: No evidence of thrombus. Normal compressibility and flow on color Doppler imaging. Femoral Vein: No  evidence of thrombus. Normal compressibility, respiratory phasicity and response to augmentation. Popliteal Vein: No evidence of thrombus. Normal compressibility, respiratory phasicity and response to augmentation. Calf Veins: No evidence of thrombus. Normal compressibility and flow on color Doppler imaging. Superficial Great Saphenous Vein: No evidence of thrombus. Normal compressibility and flow on color Doppler imaging. Venous Reflux:  None. Other Findings:  None. IMPRESSION: No evidence of deep venous thrombosis. Electronically Signed   By: Ellery Plunk M.D.   On: 04/10/2016 21:28   Dg Toe Great Left  04/10/2016  CLINICAL DATA:  Dropped something on LEFT great toe 3 months ago, pain and tingling, bruising at nail, diabetes mellitus EXAM: LEFT GREAT TOE COMPARISON:  None FINDINGS: Osseous mineralization normal. Joint spaces preserved. No acute fracture, dislocation or bone destruction. IMPRESSION: No acute osseous abnormalities. Electronically Signed   By: Ulyses Southward M.D.   On: 04/10/2016 19:59    ____________________________________________    PROCEDURES  Procedure(s) performed: None     Medications - No data to display   ____________________________________________   INITIAL IMPRESSION / ASSESSMENT AND PLAN / ED COURSE  Pertinent  imaging results that were available during my care of the patient were reviewed by me and considered in my medical decision making (see chart for details).  Patient's diagnosis is consistent with contusion of left great toe without damage to the nail. Also anticipate the patient has peripheral arterial disease that is chronic. Patient will be discharged home with prescriptions for naproxen to take as directed. Patient was placed in a cast shoe to assist with decreasing rate toe pain. She is advised to follow up with her primary care provider within the week for follow-up of her toe pain and numbness as well as for further workup of potential  peripheral arterial disease.  Patient is given ED precautions to return to the ED for any worsening or new symptoms.      ____________________________________________  FINAL CLINICAL IMPRESSION(S) / ED DIAGNOSES  Final diagnoses:  Contusion of left great  toe without damage to nail, initial encounter      NEW MEDICATIONS STARTED DURING THIS VISIT:  New Prescriptions   NAPROXEN (NAPROSYN) 500 MG TABLET    Take 1 tablet (500 mg total) by mouth 2 (two) times daily with a meal.         Hope Pigeon, PA-C 04/10/16 2159  Governor Rooks, MD 04/10/16 2349

## 2016-04-10 NOTE — Discharge Instructions (Signed)
Cryotherapy Cryotherapy is when you put ice on your injury. Ice helps lessen pain and puffiness (swelling) after an injury. Ice works the best when you start using it in the first 24 to 48 hours after an injury. HOME CARE  Put a dry or damp towel between the ice pack and your skin.  You may press gently on the ice pack.  Leave the ice on for no more than 10 to 20 minutes at a time.  Check your skin after 5 minutes to make sure your skin is okay.  Rest at least 20 minutes between ice pack uses.  Stop using ice when your skin loses feeling (numbness).  Do not use ice on someone who cannot tell you when it hurts. This includes small children and people with memory problems (dementia). GET HELP RIGHT AWAY IF:  You have white spots on your skin.  Your skin turns blue or pale.  Your skin feels waxy or hard.  Your puffiness gets worse. MAKE SURE YOU:   Understand these instructions.  Will watch your condition.  Will get help right away if you are not doing well or get worse.   This information is not intended to replace advice given to you by your health care provider. Make sure you discuss any questions you have with your health care provider.   Document Released: 06/03/2008 Document Revised: 03/09/2012 Document Reviewed: 08/08/2011 Elsevier Interactive Patient Education 2016 [employer]  Cryotherapy Cryotherapy is when you put ice on your injury. Ice helps lessen pain and puffiness (swelling) after an injury. Ice works the best when you start using it in the first 24 to 48 hours after an injury. HOME CARE  Put a dry or damp towel between the ice pack and your skin.  You may press gently on the ice pack.  Leave the ice on for no more than 10 to 20 minutes at a time.  Check your skin after 5 minutes to make sure your skin is okay.  Rest at least 20 minutes between ice pack uses.  Stop using ice when your skin loses feeling (numbness).  Do not use ice on someone who  cannot tell you when it hurts. This includes small children and people with memory problems (dementia). GET HELP RIGHT AWAY IF:  You have white spots on your skin.  Your skin turns blue or pale.  Your skin feels waxy or hard.  Your puffiness gets worse. MAKE SURE YOU:   Understand these instructions.  Will watch your condition.  Will get help right away if you are not doing well or get worse.   This information is not intended to replace advice given to you by your health care provider. Make sure you discuss any questions you have with your health care provider.   Document Released: 06/03/2008 Document Revised: 03/09/2012 Document Reviewed: 08/08/2011 Elsevier Interactive Patient Education 2016 [employer]   Peripheral Vascular Disease    Peripheral vascular disease (PVD) is a disease of the blood vessels that are not part of your heart and brain. A simple term for PVD is poor circulation. In most cases, PVD narrows the blood vessels that carry blood from your heart to the rest of your body. This can result in a decreased supply of blood to your arms, legs, and internal organs, like your stomach or kidneys. However, it most often affects a person's lower legs and feet.  There are two types of PVD.  Organic PVD. This is the more common type.  It is caused by damage to the structure of blood vessels.  Functional PVD. This is caused by conditions that make blood vessels contract and tighten (spasm). Without treatment, PVD tends to get worse over time.  PVD can also lead to acute ischemic limb. This is when an arm or limb suddenly has trouble getting enough blood. This is a medical emergency.  HOME CARE  Take medicines only as told by your doctor.  Do not use any tobacco products, including cigarettes, chewing tobacco, or electronic cigarettes. If you need help quitting, ask your doctor.  Lose weight if you are overweight, and maintain a healthy weight as told by your doctor.  Eat  a diet that is low in fat and cholesterol. If you need help, ask your doctor.  Exercise regularly. Ask your doctor for some good activities for you.  Take good care of your feet.  Wear comfortable shoes that fit well.  Check your feet often for any cuts or sores. GET HELP IF:  You have cramps in your legs while walking.  You have leg pain when you are at rest.  You have coldness in a leg or foot.  Your skin changes.  You are unable to get or have an erection (erectile dysfunction).  You have cuts or sores on your feet that are not healing. GET HELP RIGHT AWAY IF:  Your arm or leg turns cold and blue.  Your arms or legs become red, warm, swollen, painful, or numb.  You have chest pain or trouble breathing.  You suddenly have weakness in your face, arm, or leg.  You become very confused or you cannot speak.  You suddenly have a very bad headache.  You suddenly cannot see. This information is not intended to replace advice given to you by your health care provider. Make sure you discuss any questions you have with your health care provider.  Document Released: 03/12/2010 Document Revised: 01/06/2015 Document Reviewed: 05/26/2014  Elsevier Interactive Patient Education [employer]

## 2016-04-10 NOTE — ED Notes (Signed)
Pt presents to ED with complaints of left great toe pain. Pt states has been ongoing for three months. Pt states dropped something on her toe three months ago. Pt states she is just tired of the pain.

## 2016-04-16 ENCOUNTER — Ambulatory Visit: Payer: Medicaid Other

## 2016-04-16 MED ORDER — RABIES VACCINE, PCEC IM SUSR
INTRAMUSCULAR | Status: AC
  Filled 2016-04-16: qty 1

## 2016-04-17 ENCOUNTER — Ambulatory Visit: Payer: Medicaid Other

## 2016-04-17 ENCOUNTER — Encounter: Payer: Medicaid Other | Admitting: Pharmacist

## 2016-08-02 ENCOUNTER — Emergency Department: Payer: Self-pay

## 2016-08-02 ENCOUNTER — Encounter: Payer: Self-pay | Admitting: Emergency Medicine

## 2016-08-02 ENCOUNTER — Emergency Department
Admission: EM | Admit: 2016-08-02 | Discharge: 2016-08-02 | Disposition: A | Discharge status: Home / Self Care | Payer: Self-pay | Attending: Student in an Organized Health Care Education/Training Program | Admitting: Student in an Organized Health Care Education/Training Program

## 2016-08-02 DIAGNOSIS — J45909 Unspecified asthma, uncomplicated: Secondary | ICD-10-CM | POA: Insufficient documentation

## 2016-08-02 DIAGNOSIS — M549 Dorsalgia, unspecified: Secondary | ICD-10-CM

## 2016-08-02 DIAGNOSIS — M545 Low back pain: Secondary | ICD-10-CM | POA: Insufficient documentation

## 2016-08-02 DIAGNOSIS — G8929 Other chronic pain: Secondary | ICD-10-CM | POA: Insufficient documentation

## 2016-08-02 LAB — CBC WITH DIFFERENTIAL/PLATELET
Basophils Absolute: 0 10*3/uL (ref 0–0.1)
Basophils Relative: 0 %
EOS PCT: 1 %
Eosinophils Absolute: 0 10*3/uL (ref 0–0.7)
HCT: 36.6 % (ref 35.0–47.0)
Hemoglobin: 12.6 g/dL (ref 12.0–16.0)
LYMPHS ABS: 2.6 10*3/uL (ref 1.0–3.6)
LYMPHS PCT: 41 %
MCH: 31.6 pg (ref 26.0–34.0)
MCHC: 34.4 g/dL (ref 32.0–36.0)
MCV: 91.9 fL (ref 80.0–100.0)
MONOS PCT: 7 %
Monocytes Absolute: 0.5 10*3/uL (ref 0.2–0.9)
Neutro Abs: 3.3 10*3/uL (ref 1.4–6.5)
Neutrophils Relative %: 51 %
PLATELETS: 214 10*3/uL (ref 150–440)
RBC: 3.98 MIL/uL (ref 3.80–5.20)
RDW: 14.4 % (ref 11.5–14.5)
WBC: 6.5 10*3/uL (ref 3.6–11.0)

## 2016-08-02 LAB — BASIC METABOLIC PANEL
Anion gap: 3 — ABNORMAL LOW (ref 5–15)
BUN: 12 mg/dL (ref 6–20)
CHLORIDE: 107 mmol/L (ref 101–111)
CO2: 26 mmol/L (ref 22–32)
CREATININE: 0.86 mg/dL (ref 0.44–1.00)
Calcium: 8.4 mg/dL — ABNORMAL LOW (ref 8.9–10.3)
GFR calc Af Amer: >60 mL/min (ref >60)
GFR calc non Af Amer: >60 mL/min (ref >60)
GLUCOSE: 91 mg/dL (ref 65–99)
POTASSIUM: 3.8 mmol/L (ref 3.5–5.1)
SODIUM: 136 mmol/L (ref 135–145)

## 2016-08-02 MED ORDER — BACLOFEN 10 MG PO TABS: 10.0000 mg | ORAL_TABLET | Freq: Three times a day (TID) | ORAL | 0 refills | Status: DC

## 2016-08-02 MED ORDER — MELOXICAM 15 MG PO TABS: 15.0000 mg | ORAL_TABLET | Freq: Every day | ORAL | 0 refills | Status: DC

## 2016-08-02 MED ORDER — KETOROLAC TROMETHAMINE 30 MG/ML IJ SOLN
30.0000 mg | Freq: Once | INTRAMUSCULAR | Status: AC
  Administered 2016-08-02: 30 mg via INTRAVENOUS
  Filled 2016-08-02: qty 1

## 2016-08-02 NOTE — ED Triage Notes (Signed)
Patient presents to the ED with exacerbation for chronic back pain.  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  Patient states she has had severe back pain x 4.5 years.

## 2016-08-02 NOTE — ED Provider Notes (Signed)
[facility] Emergency Department Provider Note   ____________________________________________   First MD Initiated Contact with Patient 08/02/16 1807     (approximate)  I have reviewed the triage vital signs and the nursing notes.   HISTORY  Chief Complaint Back Pain    HPI Sierra Wiley is a 45 y.o. female patient states that she has been experiencing back pain for the past 4 years. However, she recently accepted a job that requires her to stand on her feet for multiple hours at a time. The pain has worsened and she is experiencing numbness and tingling into legs. She notes that for the past 3 months she has lost control of bladder function. She feels like she needs to urinate and when she arrives to the bathroom, she realizes that she has urinated herself. She denies bowel dysfunction. Patient denies taking any medications to relieve the pain. She has been taking a muscle relaxant which she states "does not help and only makes me sleepy." Currently she is experiencing 10/10 pain. She has not visited an orthopedic for evaluation of her chronic back pain.   Past Medical History:  Diagnosis Date  . Asthma   . Bipolar 1 disorder (HCC)   . Chronic back pain   . Depression   . Opiate use 02/21/2016  . PTSD (post-traumatic stress disorder)   . Seizures [facility])     Patient Active Problem List   Diagnosis Date Noted  . Chronic pain 02/21/2016  . Chronic low back pain (Location of Primary Source of Pain) (Bilateral) (R>L) 02/21/2016  . Sacrococcygeal pain 02/21/2016  . Chronic lower extremity pain (Location of Secondary source of pain) (Bilateral) (R>L) 02/21/2016  . Chronic elbow pain (Right) 02/21/2016  . Long term current use of opiate analgesic 02/21/2016  . Long term prescription opiate use 02/21/2016  . Opiate use 02/21/2016  . Encounter for therapeutic drug level monitoring 02/21/2016  . Encounter for pain management planning 02/21/2016  . Chronic  neck pain (Right) 02/21/2016  . Chronic upper extremity pain (Right) 02/21/2016  . Abnormal MRI, lumbar spine (08/16/2013) 02/21/2016  . Abnormal MRI, hip (10/14/2013) 02/21/2016  . Pain management 02/21/2016  . Asthma 11/06/2015  . Bipolar 1 disorder (HCC) 07/01/2015  . Post traumatic stress disorder 07/01/2015    Past Surgical History:  Procedure Laterality Date  . CHOLECYSTECTOMY    . ELBOW SURGERY Left 2000  . TUBAL LIGATION      Prior to Admission medications   Medication Sig Start Date End Date Taking? Authorizing Provider  ARIPiprazole (ABILIFY) 10 MG tablet Take 10 mg by mouth daily.    Historical Provider, MD  baclofen (LIORESAL) 10 MG tablet Take 1 tablet (10 mg total) by mouth 3 (three) times daily. 08/02/16   Charmayne Sheer Rockey Guarino, PA-C  citalopram (CELEXA) 10 MG tablet Take 10 mg by mouth daily.    Historical Provider, MD  clonazePAM (KLONOPIN) 0.5 MG tablet Take 0.5 mg by mouth 2 (two) times daily as needed for anxiety.    Historical Provider, MD  lamoTRIgine (LAMICTAL) 100 MG tablet Take 100 mg by mouth daily.    Historical Provider, MD  lurasidone (LATUDA) 40 MG TABS tablet Take 20 mg by mouth daily with breakfast.    Historical Provider, MD  meloxicam (MOBIC) 15 MG tablet Take 1 tablet (15 mg total) by mouth daily. 08/02/16   Charmayne Sheer Aleria Maheu, PA-C  naproxen (NAPROSYN) 500 MG tablet Take 1 tablet (500 mg total) by mouth 2 (two) times daily  with a meal. 04/10/16   Jami L Hagler, PA-C  omeprazole (PRILOSEC OTC) 20 MG tablet Take 20 mg by mouth daily.    Historical Provider, MD  phentermine 15 MG capsule Take 15 mg by mouth once.    Historical Provider, MD  sertraline (ZOLOFT) 50 MG tablet Take 50 mg by mouth daily.    Historical Provider, MD  temazepam (RESTORIL) 15 MG capsule Take 15 mg by mouth at bedtime as needed for sleep.    Historical Provider, MD  tiZANidine (ZANAFLEX) 4 MG capsule Take 4 mg by mouth 3 (three) times daily.    Historical Provider, MD     Allergies Penicillins  Family History  Problem Relation Age of Onset  . Alcohol abuse Mother   . Drug abuse Mother   . HIV Mother   . Bipolar disorder Mother   . Cirrhosis Mother   . Cancer Father   . Heart disease Father   . Drug abuse Sister   . Alcohol abuse Sister     Social History Social History  Substance Use Topics  . Smoking status: Never Smoker  . Smokeless tobacco: Never Used  . Alcohol use No    Review of Systems Constitutional: No fever/chills Cardiovascular: Denies chest pain. Respiratory: Denies shortness of breath. Gastrointestinal: No abdominal pain.  No nausea, no vomiting.  No diarrhea.  No constipation. Genitourinary: Negative for dysuria. Occasional bouts of bladder dysfunction Musculoskeletal: positive lower back pain. Denies redness, swelling, or bruising to the affected area. Skin: Negative for rash. Neurological: Negative for headaches. Positive bilateral lower extremity numbness and tingling. Pain radiates from back into lower extremities.  10-point ROS otherwise negative.  ____________________________________________   PHYSICAL EXAM:  VITAL SIGNS: ED Triage Vitals  Enc Vitals Group     BP 08/02/16 1745 125/66     Pulse Rate 08/02/16 1745 74     Resp 08/02/16 1745 18     Temp 08/02/16 1745 98.7 F (37.1 C)     Temp Source 08/02/16 1745 Oral     SpO2 08/02/16 1745 99 %     Weight 08/02/16 1745 189 lb (85.7 kg)     Height 08/02/16 1745 5\' 3"  (1.6 m)     Head Circumference --      Peak Flow --      Pain Score 08/02/16 1746 10     Pain Loc --      Pain Edu? --      Excl. in GC? --    Constitutional: Alert and oriented. Well appearing and in no acute distress. Eyes: Conjunctivae are normal.  Head: Atraumatic. Mouth/Throat: Mucous membranes are moist.  Neck: No stridor.  No cervical spine tenderness to palpation Cardiovascular: Normal rate, regular rhythm. Grossly normal heart sounds.  Good peripheral  circulation. Respiratory: Normal respiratory effort.  No retractions. Lungs CTAB. Gastrointestinal: Soft and nontender. No distention. No abdominal bruits. No CVA tenderness. Musculoskeletal: Pain to palpation over lumbar vertebrae and paraspinous muscles. No bruising, swelling, or redness noted. Decreased strength noted to right leg secondary to pain. Neurologic:  Normal speech and language. No gross focal neurologic deficits are appreciated.  Skin:  Skin is warm, dry and intact. No rash noted. Psychiatric: Mood and affect are normal. Speech and behavior are normal.  ____________________________________________   LABS (all labs ordered are listed, but only abnormal results are displayed)  Labs Reviewed  BASIC METABOLIC PANEL - Abnormal; Notable for the following:       Result Value   Calcium 8.4 (*)  Anion gap 3 (*)    All other components within normal limits  CBC WITH DIFFERENTIAL/PLATELET  CBC WITH DIFFERENTIAL/PLATELET   ____________________________________________   ____________________________________________  RADIOLOGY IMPRESSION: 1. Mildly progressive disc bulging at L4-5 with moderate right and mild left neural foraminal stenosis. 2. Mild bilateral neural foraminal and lateral recess narrowing at L5-S1, minimally progressed from prior. 3. No spinal stenosis. ____________________________________________   PROCEDURES  Procedure(s) performed: None  Procedures  Critical Care performed: No  ____________________________________________   INITIAL IMPRESSION / ASSESSMENT AND PLAN / ED COURSE  Pertinent labs & imaging results that were available during my care of the patient were reviewed by me and considered in my medical decision making (see chart for details).  Patient is having an acute flair of chronic back pain. Patient's MRI showed disc disease and patient is instructed to follow up with her PCP or orthopedic for further evaluation. Today the patient has  been prescribed a muscle relaxant and an antiinflammatory to help with the pain. Clinical Course     ____________________________________________   FINAL CLINICAL IMPRESSION(S) / ED DIAGNOSES  Final diagnoses:  Chronic back pain      NEW MEDICATIONS STARTED DURING THIS VISIT:  New Prescriptions   BACLOFEN (LIORESAL) 10 MG TABLET    Take 1 tablet (10 mg total) by mouth 3 (three) times daily.   MELOXICAM (MOBIC) 15 MG TABLET    Take 1 tablet (15 mg total) by mouth daily.     Note:  This document was prepared using Dragon voice recognition software and may include unintentional dictation errors.   Evangeline Dakin, PA-C 08/02/16 2159    Willy Eddy, MD 08/03/16 [phone]

## 2016-09-02 ENCOUNTER — Emergency Department
Admission: EM | Admit: 2016-09-02 | Discharge: 2016-09-02 | Disposition: A | Discharge status: Home / Self Care | Payer: Medicaid Other | Attending: Emergency Medicine | Admitting: Emergency Medicine

## 2016-09-02 ENCOUNTER — Encounter: Payer: Self-pay | Admitting: Emergency Medicine

## 2016-09-02 DIAGNOSIS — F419 Anxiety disorder, unspecified: Secondary | ICD-10-CM | POA: Insufficient documentation

## 2016-09-02 DIAGNOSIS — Z79899 Other long term (current) drug therapy: Secondary | ICD-10-CM | POA: Insufficient documentation

## 2016-09-02 DIAGNOSIS — Z760 Encounter for issue of repeat prescription: Secondary | ICD-10-CM | POA: Insufficient documentation

## 2016-09-02 DIAGNOSIS — J45909 Unspecified asthma, uncomplicated: Secondary | ICD-10-CM | POA: Insufficient documentation

## 2016-09-02 LAB — URINALYSIS COMPLETE WITH MICROSCOPIC (ARMC ONLY)
BACTERIA UA: NONE SEEN
Bilirubin Urine: NEGATIVE
Glucose, UA: NEGATIVE mg/dL
Ketones, ur: NEGATIVE mg/dL
Nitrite: NEGATIVE
PH: 5 (ref 5.0–8.0)
PROTEIN: NEGATIVE mg/dL
Specific Gravity, Urine: 1.02 (ref 1.005–1.030)

## 2016-09-02 LAB — POCT PREGNANCY, URINE: Preg Test, Ur: NEGATIVE

## 2016-09-02 MED ORDER — CLONAZEPAM 0.5 MG PO TABS: 0.5000 mg | ORAL_TABLET | Freq: Two times a day (BID) | ORAL | 0 refills | Status: DC | PRN

## 2016-09-02 NOTE — ED Triage Notes (Addendum)
Pt to ed with c/o anxiety. Pt states she has been shaking all over.  Pt states she has not had meds for anxiety due to financial issues.  Denies HI, denies SI, denies hallucinations.  States she does have lower pelvic pain and frequency of urination.

## 2016-09-02 NOTE — Discharge Instructions (Signed)
You were evaluated for anxiety emergency department, and are being provided several days prescription for anxiety medication clonazepam, until you see your prescribing psychiatrist at [facility] on Wednesday.  Return to the emergency room for any worsening condition including any worsening symptoms of shaking, seizure, confusion or altered mental status, depression, or any thoughts of wanting to hurt yourself or others.

## 2016-09-02 NOTE — ED Provider Notes (Signed)
[facility] Emergency Department Provider Note ____________________________________________   I have reviewed the triage vital signs and the triage nursing note.  HISTORY  Chief Complaint Anxiety   Historian Patient  HPI Sierra Wiley is a 45 y.o. female with a history of bipolar, post traumatic stress disorder, and anxiety, presents today for anxiety. She states that she follows with Dr. Elesa Massed at [facility] for psychiatry, and has run out of one of her anxiety medications. This was about a month ago. She states that her stress and anxiety has been getting worse and she is having trouble sleeping again. She sometimes starts to shake in her arms and her body when she is feeling very anxious and other people or even noticing it. In eyes drug or alcohol use at this point in time and denies withdrawal symptoms.  No suicidal ideation.  She has an appointment with Dr. Elesa Massed on Wednesday. She is requesting refill on anxiety prescription.    Past Medical History:  Diagnosis Date  . Asthma   . Bipolar 1 disorder (HCC)   . Chronic back pain   . Depression   . Opiate use 02/21/2016  . PTSD (post-traumatic stress disorder)   . Seizures [facility])     Patient Active Problem List   Diagnosis Date Noted  . Chronic pain 02/21/2016  . Chronic low back pain (Location of Primary Source of Pain) (Bilateral) (R>L) 02/21/2016  . Sacrococcygeal pain 02/21/2016  . Chronic lower extremity pain (Location of Secondary source of pain) (Bilateral) (R>L) 02/21/2016  . Chronic elbow pain (Right) 02/21/2016  . Long term current use of opiate analgesic 02/21/2016  . Long term prescription opiate use 02/21/2016  . Opiate use 02/21/2016  . Encounter for therapeutic drug level monitoring 02/21/2016  . Encounter for pain management planning 02/21/2016  . Chronic neck pain (Right) 02/21/2016  . Chronic upper extremity pain (Right) 02/21/2016  . Abnormal MRI, lumbar spine (08/16/2013) 02/21/2016  .  Abnormal MRI, hip (10/14/2013) 02/21/2016  . Pain management 02/21/2016  . Asthma 11/06/2015  . Bipolar 1 disorder (HCC) 07/01/2015  . Post traumatic stress disorder 07/01/2015    Past Surgical History:  Procedure Laterality Date  . CHOLECYSTECTOMY    . ELBOW SURGERY Left 2000  . TUBAL LIGATION      Prior to Admission medications   Medication Sig Start Date End Date Taking? Authorizing Provider  ARIPiprazole (ABILIFY) 10 MG tablet Take 10 mg by mouth daily.    Historical Provider, MD  baclofen (LIORESAL) 10 MG tablet Take 1 tablet (10 mg total) by mouth 3 (three) times daily. 08/02/16   Charmayne Sheer Beers, PA-C  citalopram (CELEXA) 10 MG tablet Take 10 mg by mouth daily.    Historical Provider, MD  clonazePAM (KLONOPIN) 0.5 MG tablet Take 1 tablet (0.5 mg total) by mouth 2 (two) times daily as needed for anxiety. 09/02/16   Governor Rooks, MD  lamoTRIgine (LAMICTAL) 100 MG tablet Take 100 mg by mouth daily.    Historical Provider, MD  lurasidone (LATUDA) 40 MG TABS tablet Take 20 mg by mouth daily with breakfast.    Historical Provider, MD  meloxicam (MOBIC) 15 MG tablet Take 1 tablet (15 mg total) by mouth daily. 08/02/16   Charmayne Sheer Beers, PA-C  naproxen (NAPROSYN) 500 MG tablet Take 1 tablet (500 mg total) by mouth 2 (two) times daily with a meal. 04/10/16   Jami L Hagler, PA-C  omeprazole (PRILOSEC OTC) 20 MG tablet Take 20 mg by mouth daily.  Historical Provider, MD  phentermine 15 MG capsule Take 15 mg by mouth once.    Historical Provider, MD  sertraline (ZOLOFT) 50 MG tablet Take 50 mg by mouth daily.    Historical Provider, MD  temazepam (RESTORIL) 15 MG capsule Take 15 mg by mouth at bedtime as needed for sleep.    Historical Provider, MD  tiZANidine (ZANAFLEX) 4 MG capsule Take 4 mg by mouth 3 (three) times daily.    Historical Provider, MD    Allergies  Allergen Reactions  . Penicillins Nausea And Vomiting    Family History  Problem Relation Age of Onset  . Alcohol abuse  Mother   . Drug abuse Mother   . HIV Mother   . Bipolar disorder Mother   . Cirrhosis Mother   . Cancer Father   . Heart disease Father   . Drug abuse Sister   . Alcohol abuse Sister     Social History Social History  Substance Use Topics  . Smoking status: Never Smoker  . Smokeless tobacco: Never Used  . Alcohol use No    Review of Systems  Constitutional: Negative for fever. Eyes: Negative for visual changes. ENT: Negative for sore throat. Cardiovascular: Negative for chest pain. Respiratory: Negative for shortness of breath. Gastrointestinal: Negative for abdominal pain, vomiting and diarrhea. Genitourinary: Negative for dysuria. Musculoskeletal: Negative for back pain. Skin: Negative for rash. Neurological: Negative for headache. 10 point Review of Systems otherwise negative ____________________________________________   PHYSICAL EXAM:  VITAL SIGNS: ED Triage Vitals  Enc Vitals Group     BP 09/02/16 0916 121/65     Pulse Rate 09/02/16 0916 80     Resp 09/02/16 0916 16     Temp 09/02/16 0916 99.3 F (37.4 C)     Temp Source 09/02/16 0916 Oral     SpO2 09/02/16 0916 100 %     Weight 09/02/16 0916 185 lb (83.9 kg)     Height 09/02/16 0916 5\' 3"  (1.6 m)     Head Circumference --      Peak Flow --      Pain Score 09/02/16 0924 7     Pain Loc --      Pain Edu? --      Excl. in GC? --      Constitutional: Alert and oriented. Well appearing and in no distress. HEENT   Head: Normocephalic and atraumatic.      Eyes: Conjunctivae are normal. PERRL. Right eye disconjugate gaze.      Ears:         Nose: No congestion/rhinnorhea.   Mouth/Throat: Mucous membranes are moist.   Neck: No stridor. Cardiovascular/Chest: Normal rate, regular rhythm.  No murmurs, rubs, or gallops. Respiratory: Normal respiratory effort without tachypnea nor retractions. Breath sounds are clear and equal bilaterally. No wheezes/rales/rhonchi. Gastrointestinal: Soft. No  distention, no guarding, no rebound. Nontender.    Genitourinary/rectal:Deferred Musculoskeletal: Nontender with normal range of motion in all extremities. No joint effusions.  No lower extremity tenderness.  No edema. Neurologic:  Normal speech and language. No gross or focal neurologic deficits are appreciated. Skin:  Skin is warm, dry and intact. No rash noted. Psychiatric: Somewhat flat affect, she reports some mild depressed mood and anxiety, but appears calm. Speech and behavior are normal. Patient exhibits appropriate insight and judgment.  ____________________________________________   EKG I, Governor Rooks, MD, the attending physician have personally viewed and interpreted all ECGs.  None ____________________________________________  LABS (pertinent positives/negatives)  Labs Reviewed  URINALYSIS  COMPLETEWITH MICROSCOPIC ([facility] ONLY) - Abnormal; Notable for the following:       Result Value   Color, Urine YELLOW (*)    APPearance CLEAR (*)    Hgb urine dipstick 1+ (*)    Leukocytes, UA 1+ (*)    Squamous Epithelial / LPF 0-5 (*)    All other components within normal limits  POCT PREGNANCY, URINE  POC URINE PREG, ED    ____________________________________________  RADIOLOGY All Xrays were viewed by me. Imaging interpreted by Radiologist.  None __________________________________________  PROCEDURES  Procedure(s) performed: None  Critical Care performed: None  ____________________________________________   ED COURSE / ASSESSMENT AND PLAN  Pertinent labs & imaging results that were available during my care of the patient were reviewed by me and considered in my medical decision making (see chart for details).   Ms. Topper is here due to ongoing anxiety, especially worsening over the past couple weeks since she ran out of her clonazepam. She [facility], and has an appointment in 2 days on Wednesday.  She was reporting  continued anxiety, and mild depression, without any suicidal ideation. She is calm here, and I don't think that she needs emergency psychiatric evaluation or involuntary commitment.  I will prescribed 5 tablets of 0.5 mg clonazepam as needed for anxiety until Wednesday when she can see her psychiatrist.  I was unable to access the [place] controlled substances reporting system due to technical difficulties today, in order to find her last prescription refills, however I think 2/3 days worth is reasonable.   CONSULTATIONS:   None   Patient / Family / Caregiver informed of clinical course, medical decision-making process, and agree with plan.   I discussed return precautions, follow-up instructions, and discharged instructions with patient and/or family.   ___________________________________________   FINAL CLINICAL IMPRESSION(S) / ED DIAGNOSES   Final diagnoses:  Anxiety  Prescription refill              Note: This dictation was prepared with Dragon dictation. Any transcriptional errors that result from this process are unintentional    Governor Rooksebecca Perl Folmar, MD 09/02/16 [phone]

## 2016-09-04 ENCOUNTER — Emergency Department
Admission: EM | Admit: 2016-09-04 | Discharge: 2016-09-05 | Disposition: A | Discharge status: Other Acute Inpatient Hospital | Payer: No Typology Code available for payment source | Attending: Emergency Medicine | Admitting: Emergency Medicine

## 2016-09-04 DIAGNOSIS — F319 Bipolar disorder, unspecified: Secondary | ICD-10-CM | POA: Insufficient documentation

## 2016-09-04 DIAGNOSIS — Z79899 Other long term (current) drug therapy: Secondary | ICD-10-CM | POA: Insufficient documentation

## 2016-09-04 DIAGNOSIS — F32A Depression, unspecified: Secondary | ICD-10-CM

## 2016-09-04 DIAGNOSIS — F329 Major depressive disorder, single episode, unspecified: Secondary | ICD-10-CM

## 2016-09-04 DIAGNOSIS — J45909 Unspecified asthma, uncomplicated: Secondary | ICD-10-CM | POA: Insufficient documentation

## 2016-09-04 LAB — COMPREHENSIVE METABOLIC PANEL
ALK PHOS: 33 U/L — AB (ref 38–126)
ALT: 14 U/L (ref 14–54)
AST: 19 U/L (ref 15–41)
Albumin: 3.4 g/dL — ABNORMAL LOW (ref 3.5–5.0)
Anion gap: 6 (ref 5–15)
BUN: 8 mg/dL (ref 6–20)
CALCIUM: 8.7 mg/dL — AB (ref 8.9–10.3)
CO2: 26 mmol/L (ref 22–32)
CREATININE: 1.08 mg/dL — AB (ref 0.44–1.00)
Chloride: 106 mmol/L (ref 101–111)
GFR calc non Af Amer: >60 mL/min (ref >60)
GLUCOSE: 142 mg/dL — AB (ref 65–99)
Potassium: 3.3 mmol/L — ABNORMAL LOW (ref 3.5–5.1)
SODIUM: 138 mmol/L (ref 135–145)
Total Bilirubin: 0.9 mg/dL (ref 0.3–1.2)
Total Protein: 6.5 g/dL (ref 6.5–8.1)

## 2016-09-04 LAB — URINE DRUG SCREEN, QUALITATIVE (ARMC ONLY)
Amphetamines, Ur Screen: NOT DETECTED
BARBITURATES, UR SCREEN: NOT DETECTED
Benzodiazepine, Ur Scrn: NOT DETECTED
CANNABINOID 50 NG, UR ~~LOC~~: NOT DETECTED
COCAINE METABOLITE, UR ~~LOC~~: NOT DETECTED
MDMA (ECSTASY) UR SCREEN: NOT DETECTED
Methadone Scn, Ur: NOT DETECTED
OPIATE, UR SCREEN: NOT DETECTED
PHENCYCLIDINE (PCP) UR S: NOT DETECTED
Tricyclic, Ur Screen: NOT DETECTED

## 2016-09-04 LAB — URINALYSIS COMPLETE WITH MICROSCOPIC (ARMC ONLY)
BACTERIA UA: NONE SEEN
Bilirubin Urine: NEGATIVE
Glucose, UA: NEGATIVE mg/dL
NITRITE: NEGATIVE
PH: 5 (ref 5.0–8.0)
Protein, ur: 30 mg/dL — AB
SPECIFIC GRAVITY, URINE: 1.024 (ref 1.005–1.030)

## 2016-09-04 LAB — CBC
HEMATOCRIT: 43.6 % (ref 35.0–47.0)
HEMOGLOBIN: 14.7 g/dL (ref 12.0–16.0)
MCH: 30.8 pg (ref 26.0–34.0)
MCHC: 33.8 g/dL (ref 32.0–36.0)
MCV: 91.1 fL (ref 80.0–100.0)
Platelets: 277 10*3/uL (ref 150–440)
RBC: 4.78 MIL/uL (ref 3.80–5.20)
RDW: 15.1 % — ABNORMAL HIGH (ref 11.5–14.5)
WBC: 9.8 10*3/uL (ref 3.6–11.0)

## 2016-09-04 LAB — SALICYLATE LEVEL

## 2016-09-04 LAB — ETHANOL: Alcohol, Ethyl (B): <5 mg/dL (ref <5)

## 2016-09-04 LAB — ACETAMINOPHEN LEVEL: Acetaminophen (Tylenol), Serum: <10 ug/mL — ABNORMAL LOW (ref 10–30)

## 2016-09-04 MED ORDER — CITALOPRAM HYDROBROMIDE 20 MG PO TABS
10.0000 mg | ORAL_TABLET | Freq: Every day | ORAL | Status: DC
  Administered 2016-09-04 – 2016-09-05 (×2): 10 mg via ORAL
  Filled 2016-09-04 (×3): qty 1

## 2016-09-04 MED ORDER — ACETAMINOPHEN 500 MG PO TABS
ORAL_TABLET | ORAL | Status: AC
  Administered 2016-09-04: 1000 mg via ORAL
  Filled 2016-09-04: qty 2

## 2016-09-04 MED ORDER — ARIPIPRAZOLE 10 MG PO TABS
10.0000 mg | ORAL_TABLET | Freq: Every day | ORAL | Status: DC
  Administered 2016-09-04 – 2016-09-05 (×2): 10 mg via ORAL
  Filled 2016-09-04 (×3): qty 1

## 2016-09-04 MED ORDER — ACETAMINOPHEN 500 MG PO TABS
1000.0000 mg | ORAL_TABLET | Freq: Once | ORAL | Status: AC
  Administered 2016-09-04: 1000 mg via ORAL

## 2016-09-04 MED ORDER — LURASIDONE HCL 40 MG PO TABS
40.0000 mg | ORAL_TABLET | Freq: Every day | ORAL | Status: DC
  Administered 2016-09-05: 40 mg via ORAL
  Filled 2016-09-04: qty 1

## 2016-09-04 MED ORDER — CLONAZEPAM 0.5 MG PO TABS
0.5000 mg | ORAL_TABLET | Freq: Two times a day (BID) | ORAL | Status: DC | PRN
Start: 2016-09-04 — End: 2016-09-05
  Administered 2016-09-04: 0.5 mg via ORAL
  Filled 2016-09-04: qty 1

## 2016-09-04 MED ORDER — TRAZODONE HCL 100 MG PO TABS
100.0000 mg | ORAL_TABLET | Freq: Every day | ORAL | Status: DC
  Administered 2016-09-04: 100 mg via ORAL
  Filled 2016-09-04: qty 1

## 2016-09-04 MED ORDER — PANTOPRAZOLE SODIUM 40 MG PO TBEC
40.0000 mg | DELAYED_RELEASE_TABLET | Freq: Every day | ORAL | Status: DC
  Administered 2016-09-04 – 2016-09-05 (×2): 40 mg via ORAL
  Filled 2016-09-04 (×3): qty 1

## 2016-09-04 MED ORDER — LAMOTRIGINE 100 MG PO TABS
100.0000 mg | ORAL_TABLET | Freq: Every day | ORAL | Status: DC
  Administered 2016-09-04 – 2016-09-05 (×2): 100 mg via ORAL
  Filled 2016-09-04 (×3): qty 1

## 2016-09-04 NOTE — ED Triage Notes (Signed)
Pt arrives with [place] [employer]. Pt have IVC paperwork. She was IVC'd due to having thoughts of hurting herself and hurting her boyfriend.  She currently still has these thoughts. Pt also reports anxiety. Was seen here two days ago for anxiety.

## 2016-09-04 NOTE — ED Notes (Signed)
ENVIRONMENTAL ASSESSMENT Potentially harmful objects out of patient reach: Yes Personal belongings secured: Yes Patient dressed in hospital provided attire only: Yes Plastic bags out of patient reach: Yes Patient care equipment (cords, cables, call bells, lines, and drains) shortened, removed, or accounted for: Yes Equipment and supplies removed from bottom of stretcher: Yes Potentially toxic materials out of patient reach: Yes Sharps container removed or out of patient reach: Yes  Patient currently in room resting. No signs of distress noted. Maintained on 15 minute checks and observation by security camera for safety.  

## 2016-09-04 NOTE — ED Notes (Signed)
[facility]  Pt to transfer at this time

## 2016-09-04 NOTE — ED Notes (Signed)
Patient was brought to the emergency room after having a fight with her boyfriend. She also says that she felt suicidal and that she has had worsening mood, depression, anxiety and nightmares. Patient currently denies SI/HI/AVH and pain. Patient does feel anxious and was given klonopin to ease anxiety. Patient is calm and cooperative and is aware that she will likely be admitted to the hospital. Will continue to monitor. Maintained on 15 minute checks and observation by security camera for safety.

## 2016-09-04 NOTE — BH Assessment (Signed)
Assessment Note  Sierra Wiley is an 45 y.o. female presenting to the ED, under IVC, from [facility] with concerns of suicidal thoughts without intent and increasing depression.  Patient reports the got into an argument with her boyfriend because she found out he was cheating on her.  She was originally living with her boyfriend, however, after she broke up with him, she reports she found herself without a place to live.    Patient reports additional stressors:  Loosing custody of her children, loosing health insurance, no housing assistance and not sleeping.  Patient reports feeling helpless/hopeless, low energy levels and SI/HI with no intent.  Patient has been evaluated by Psych MD and recommended for inpatient hospitalization.  Diagnosis: Bipolar disorder  Past Medical History:  Past Medical History:  Diagnosis Date  . Asthma   . Bipolar 1 disorder (HCC)   . Chronic back pain   . Depression   . Opiate use 02/21/2016  . PTSD (post-traumatic stress disorder)   . Seizures (HCC)     Past Surgical History:  Procedure Laterality Date  . CHOLECYSTECTOMY    . ELBOW SURGERY Left 2000  . TUBAL LIGATION      Family History:  Family History  Problem Relation Age of Onset  . Alcohol abuse Mother   . Drug abuse Mother   . HIV Mother   . Bipolar disorder Mother   . Cirrhosis Mother   . Cancer Father   . Heart disease Father   . Drug abuse Sister   . Alcohol abuse Sister     Social History:  reports that she has never smoked. She has never used smokeless tobacco. She reports that she does not drink alcohol or use drugs.  Additional Social History:  Alcohol / Drug Use History of alcohol / drug use?: Yes Longest period of sobriety (when/how long): Patient has a history of longtern use of opiate analgesic Negative Consequences of Use: Personal relationships, Legal  CIWA: CIWA-Ar BP: 99/66 Pulse Rate: 95 COWS:    Allergies:  Allergies  Allergen Reactions  . Penicillins Nausea And  Vomiting    Home Medications:  (Not in a hospital admission)  OB/GYN Status:  No LMP recorded.  General Assessment Data [facility] Assessment: In system Is this a Tele or Face-to-Face Assessment?: Face-to-Face Is this an Initial Assessment or a Re-assessment for this encounter?: Initial Assessment Marital status: Married [place] Georg[place] P[place] Is patient pregnant?: No Pregnancy Status: No Living Arrangements: Alone Can pt return to current living arrangement?: Yes Admission Status: Involuntary Is patient capable of signing voluntary admission?: Yes Referral Source: Psychiatrist Insurance type: Medicaid  Medical Screening Exam [facility] Walk-in ONLY) Medical Exam completed: Yes  Crisis Care Plan Living Arrangements: Alone Legal Guardian: Other: (self) Name of Psychiatrist: Scherrie Batemanavid Ward, MD Name of Therapist: [facility]  Education Status Is patient currently in school?: No Current Grade: n/a Highest grade of school patient has completed: special education Name of school: n/a Contact person: n/a  Risk to self with the past 6 months Suicidal Ideation: Yes-Currently Present Has patient been a risk to self within the past 6 months prior to admission? : No Suicidal Intent: No Has patient had any suicidal intent within the past 6 months prior to admission? : No Is patient at risk for suicide?: Yes Suicidal Plan?: No Has patient had any suicidal plan within the past 6 months prior to admission? : No Access to Means: Yes Specify Access to Suicidal Means: Pt reports having acces  to a razor What has been your use of drugs/alcohol within the last 12 months?: None reported by patient Previous Attempts/Gestures: Yes How many times?: 1 Other Self Harm Risks: None identified Triggers for Past Attempts: Spouse contact, Other (Comment) Intentional Self Injurious Behavior: None Family Suicide History: No Recent stressful life event(s): Conflict (Comment), Financial  Problems, Other (Comment) Persecutory voices/beliefs?: No Depression: Yes Depression Symptoms: Loss of interest in usual pleasures, Feeling worthless/self pity, Tearfulness Substance abuse history and/or treatment for substance abuse?: No Suicide prevention information given to non-admitted patients: Not applicable  Risk to Others within the past 6 months Homicidal Ideation: No Does patient have any lifetime risk of violence toward others beyond the six months prior to admission? : No Thoughts of Harm to Others: No Current Homicidal Intent: No Current Homicidal Plan: No Access to Homicidal Means: No Identified Victim: None identified History of harm to others?: No Assessment of Violence: None Noted Violent Behavior Description: None identified Does patient have access to weapons?: No Criminal Charges Pending?: No Does patient have a court date: No Is patient on probation?: No  Psychosis Hallucinations: None noted Delusions: None noted  Mental Status Report Appearance/Hygiene: In scrubs Eye Contact: Fair Motor Activity: Unremarkable Speech: Logical/coherent Level of Consciousness: Alert Mood: Depressed Affect: Appropriate to circumstance, Depressed Anxiety Level: Minimal Thought Processes: Coherent, Relevant Judgement: Partial Orientation: Person, Place, Time, Situation Obsessive Compulsive Thoughts/Behaviors: None  Cognitive Functioning Concentration: Good Memory: Recent Intact, Remote Intact IQ: Average Insight: Fair Impulse Control: Fair Appetite: Fair Sleep: No Change Vegetative Symptoms: None  ADLScreening [facility] Assessment Services) Patient's cognitive ability adequate to safely complete daily activities?: Yes Patient able to express need for assistance with ADLs?: Yes Independently performs ADLs?: Yes (appropriate for developmental age)  Prior Inpatient Therapy Prior Inpatient Therapy: Yes Prior Therapy Dates: 2007 Reason for Treatment:  depression  Prior Outpatient Therapy Prior Outpatient Therapy: Yes Prior Therapy Dates: current Prior Therapy Facilty/Provider(s): [facility] Reason for Treatment: depression Does patient have an ACCT team?: No Does patient have Intensive In-House Services?  : No Does patient have [facility] services? : No Does patient have P4CC services?: No  ADL Screening (condition at time of admission) Patient's cognitive ability adequate to safely complete daily activities?: Yes Patient able to express need for assistance with ADLs?: Yes Independently performs ADLs?: Yes (appropriate for developmental age)       Abuse/Neglect Assessment (Assessment to be complete while patient is alone) Physical Abuse: Denies Verbal Abuse: Denies Sexual Abuse: Denies Exploitation of patient/patient's resources: Denies Self-Neglect: Denies Values / Beliefs Cultural Requests During Hospitalization: None Spiritual Requests During Hospitalization: None Consults Spiritual Care Consult Needed: No Social Work Consult Needed: No      Additional Information 1:1 In Past 12 Months?: No CIRT Risk: No Elopement Risk: No Does patient have medical clearance?: Yes     Disposition:  Disposition Initial Assessment Completed for this Encounter: Yes Disposition of Patient: Inpatient treatment program Type of inpatient treatment program: Adult  On Site Evaluation by:   Reviewed with Physician:    [place] 09/04/2016 10:20 PM
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
## 2016-09-04 NOTE — ED Notes (Signed)
[facility]
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
## 2016-09-04 NOTE — ED Provider Notes (Signed)
[facility] Emergency Department Provider Note    ____________________________________________   I have reviewed the triage vital signs and the nursing notes.   HISTORY  Chief Complaint Mental Health Problem and Suicidal   History limited by: Not Limited   HPI Sierra Wiley is a 45 y.o. female presents to the emergency department today under IVC secondary to depression and thoughts of wanting hurt herself. She states that she has been having a bad depression for at least the past 6 months. She has been having thoughts about wanting to hurt herself. She has tried hurting herself in the past. She states she has been taking her medications as prescribed. No recent fevers, chest pain or stomach pain.   Past Medical History:  Diagnosis Date  . Asthma   . Bipolar 1 disorder (HCC)   . Chronic back pain   . Depression   . Opiate use 02/21/2016  . PTSD (post-traumatic stress disorder)   . Seizures [facility])     Patient Active Problem List   Diagnosis Date Noted  . Chronic pain 02/21/2016  . Chronic low back pain (Location of Primary Source of Pain) (Bilateral) (R>L) 02/21/2016  . Sacrococcygeal pain 02/21/2016  . Chronic lower extremity pain (Location of Secondary source of pain) (Bilateral) (R>L) 02/21/2016  . Chronic elbow pain (Right) 02/21/2016  . Long term current use of opiate analgesic 02/21/2016  . Long term prescription opiate use 02/21/2016  . Opiate use 02/21/2016  . Encounter for therapeutic drug level monitoring 02/21/2016  . Encounter for pain management planning 02/21/2016  . Chronic neck pain (Right) 02/21/2016  . Chronic upper extremity pain (Right) 02/21/2016  . Abnormal MRI, lumbar spine (08/16/2013) 02/21/2016  . Abnormal MRI, hip (10/14/2013) 02/21/2016  . Pain management 02/21/2016  . Asthma 11/06/2015  . Bipolar 1 disorder (HCC) 07/01/2015  . Post traumatic stress disorder 07/01/2015    Past Surgical History:  Procedure  Laterality Date  . CHOLECYSTECTOMY    . ELBOW SURGERY Left 2000  . TUBAL LIGATION      Prior to Admission medications   Medication Sig Start Date End Date Taking? Authorizing Provider  ARIPiprazole (ABILIFY) 10 MG tablet Take 10 mg by mouth daily.    Historical Provider, MD  baclofen (LIORESAL) 10 MG tablet Take 1 tablet (10 mg total) by mouth 3 (three) times daily. 08/02/16   Charmayne Sheer Beers, PA-C  citalopram (CELEXA) 10 MG tablet Take 10 mg by mouth daily.    Historical Provider, MD  clonazePAM (KLONOPIN) 0.5 MG tablet Take 1 tablet (0.5 mg total) by mouth 2 (two) times daily as needed for anxiety. 09/02/16   Governor Rooks, MD  lamoTRIgine (LAMICTAL) 100 MG tablet Take 100 mg by mouth daily.    Historical Provider, MD  lurasidone (LATUDA) 40 MG TABS tablet Take 20 mg by mouth daily with breakfast.    Historical Provider, MD  meloxicam (MOBIC) 15 MG tablet Take 1 tablet (15 mg total) by mouth daily. 08/02/16   Charmayne Sheer Beers, PA-C  naproxen (NAPROSYN) 500 MG tablet Take 1 tablet (500 mg total) by mouth 2 (two) times daily with a meal. 04/10/16   Jami L Hagler, PA-C  omeprazole (PRILOSEC OTC) 20 MG tablet Take 20 mg by mouth daily.    Historical Provider, MD  phentermine 15 MG capsule Take 15 mg by mouth once.    Historical Provider, MD  sertraline (ZOLOFT) 50 MG tablet Take 50 mg by mouth daily.    Historical Provider, MD  temazepam (RESTORIL) 15 MG capsule Take 15 mg by mouth at bedtime as needed for sleep.    Historical Provider, MD  tiZANidine (ZANAFLEX) 4 MG capsule Take 4 mg by mouth 3 (three) times daily.    Historical Provider, MD    Allergies Penicillins  Family History  Problem Relation Age of Onset  . Alcohol abuse Mother   . Drug abuse Mother   . HIV Mother   . Bipolar disorder Mother   . Cirrhosis Mother   . Cancer Father   . Heart disease Father   . Drug abuse Sister   . Alcohol abuse Sister     Social History Social History  Substance Use Topics  . Smoking status:  Never Smoker  . Smokeless tobacco: Never Used  . Alcohol use No    Review of Systems  Constitutional: Negative for fever. Cardiovascular: Negative for chest pain. Respiratory: Negative for shortness of breath. Gastrointestinal: Negative for abdominal pain, vomiting and diarrhea. Neurological: Negative for headaches, focal weakness or numbness. Psychiatric: Positive for depression and thoughts wanting to hurt herself  10-point ROS otherwise negative.  ____________________________________________   PHYSICAL EXAM:  VITAL SIGNS: ED Triage Vitals   Enc Vitals Group     BP 99/66     Pulse Rate 95     Resp 18     Temp 98.2 F (36.8 C)     Temp Source Oral     SpO2 97 %     Weight 185 lb (83.9 kg)     Height 5\' 3"  (1.6 m)   Constitutional: Alert and oriented. Appears somewhat depressed Eyes: Conjunctivae are normal. Normal extraocular movements. ENT   Head: Normocephalic and atraumatic.   Nose: No congestion/rhinnorhea.   Mouth/Throat: Mucous membranes are moist.   Neck: No stridor. Hematological/Lymphatic/Immunilogical: No cervical lymphadenopathy. Cardiovascular: Normal rate, regular rhythm.  No murmurs, rubs, or gallops. Respiratory: Normal respiratory effort without tachypnea nor retractions. Breath sounds are clear and equal bilaterally. No wheezes/rales/rhonchi. Gastrointestinal: Soft and nontender. No distention.  Genitourinary: Deferred Musculoskeletal: Normal range of motion in all extremities. No lower extremity edema. Neurologic:  Normal speech and language. No gross focal neurologic deficits are appreciated.  Skin:  Skin is warm, dry and intact. No rash noted. Psychiatric: Depressed. Endorses thoughts wanting hurt herself. ____________________________________________    LABS (pertinent positives/negatives)  Labs Reviewed  COMPREHENSIVE METABOLIC PANEL - Abnormal; Notable for the following:       Result Value   Potassium 3.3 (*)    Glucose, Bld  142 (*)    Creatinine, Ser 1.08 (*)    Calcium 8.7 (*)    Albumin 3.4 (*)    Alkaline Phosphatase 33 (*)    All other components within normal limits  ACETAMINOPHEN LEVEL - Abnormal; Notable for the following:    Acetaminophen (Tylenol), Serum <10 (*)    All other components within normal limits  CBC - Abnormal; Notable for the following:    RDW 15.1 (*)    All other components within normal limits  URINALYSIS COMPLETEWITH MICROSCOPIC ([facility] ONLY) - Abnormal; Notable for the following:    Color, Urine YELLOW (*)    APPearance CLEAR (*)    Ketones, ur TRACE (*)    Hgb urine dipstick 1+ (*)    Protein, ur 30 (*)    Leukocytes, UA TRACE (*)    Squamous Epithelial / LPF 0-5 (*)    All other components within normal limits  ETHANOL  SALICYLATE LEVEL  URINE DRUG SCREEN, QUALITATIVE ([facility]  ONLY)     ____________________________________________   EKG  None  ____________________________________________    RADIOLOGY  None  ____________________________________________   PROCEDURES  Procedures  ____________________________________________   INITIAL IMPRESSION / ASSESSMENT AND PLAN / ED COURSE  Pertinent labs & imaging results that were available during my care of the patient were reviewed by me and considered in my medical decision making (see chart for details).  Patient presents to the emergency department today under IVC paperwork because of concerns for depression thoughts wanting to hurt herself. Will continue IVC paperwork.    ____________________________________________   FINAL CLINICAL IMPRESSION(S) / ED DIAGNOSES  Depression Suicidal Ideation  Note: This dictation was prepared with Dragon dictation. Any transcriptional errors that result from this process are unintentional    Phineas Semen, MD 09/04/16 2156

## 2016-09-04 NOTE — ED Notes (Signed)

## 2016-09-05 ENCOUNTER — Inpatient Hospital Stay
Admission: EM | Admit: 2016-09-05 | Discharge: 2016-09-05 | DRG: 885 | Disposition: A | Discharge status: Home / Self Care | Payer: No Typology Code available for payment source | Source: Intra-hospital | Attending: Psychiatry | Admitting: Psychiatry

## 2016-09-05 DIAGNOSIS — F319 Bipolar disorder, unspecified: Secondary | ICD-10-CM | POA: Diagnosis not present

## 2016-09-05 DIAGNOSIS — G8929 Other chronic pain: Secondary | ICD-10-CM | POA: Diagnosis present

## 2016-09-05 DIAGNOSIS — Z8249 Family history of ischemic heart disease and other diseases of the circulatory system: Secondary | ICD-10-CM

## 2016-09-05 DIAGNOSIS — J45909 Unspecified asthma, uncomplicated: Secondary | ICD-10-CM | POA: Diagnosis present

## 2016-09-05 DIAGNOSIS — Z9851 Tubal ligation status: Secondary | ICD-10-CM

## 2016-09-05 DIAGNOSIS — Z811 Family history of alcohol abuse and dependence: Secondary | ICD-10-CM | POA: Diagnosis not present

## 2016-09-05 DIAGNOSIS — Z809 Family history of malignant neoplasm, unspecified: Secondary | ICD-10-CM

## 2016-09-05 DIAGNOSIS — R45851 Suicidal ideations: Secondary | ICD-10-CM | POA: Diagnosis present

## 2016-09-05 DIAGNOSIS — Z880 Allergy status to penicillin: Secondary | ICD-10-CM | POA: Diagnosis not present

## 2016-09-05 DIAGNOSIS — Z79899 Other long term (current) drug therapy: Secondary | ICD-10-CM

## 2016-09-05 DIAGNOSIS — Z830 Family history of human immunodeficiency virus [HIV] disease: Secondary | ICD-10-CM

## 2016-09-05 DIAGNOSIS — Z9049 Acquired absence of other specified parts of digestive tract: Secondary | ICD-10-CM | POA: Diagnosis not present

## 2016-09-05 DIAGNOSIS — M549 Dorsalgia, unspecified: Secondary | ICD-10-CM | POA: Diagnosis present

## 2016-09-05 DIAGNOSIS — Z818 Family history of other mental and behavioral disorders: Secondary | ICD-10-CM

## 2016-09-05 DIAGNOSIS — F4310 Post-traumatic stress disorder, unspecified: Secondary | ICD-10-CM | POA: Diagnosis present

## 2016-09-05 MED ORDER — ALUM & MAG HYDROXIDE-SIMETH 200-200-20 MG/5ML PO SUSP: 30.0000 mL | ORAL | Status: DC | PRN

## 2016-09-05 MED ORDER — MAGNESIUM HYDROXIDE 400 MG/5ML PO SUSP: 30.0000 mL | Freq: Every day | ORAL | Status: DC | PRN

## 2016-09-05 MED ORDER — TRAZODONE HCL 100 MG PO TABS
100.0000 mg | ORAL_TABLET | Freq: Every day | ORAL | Status: DC
  Filled 2016-09-05: qty 1

## 2016-09-05 MED ORDER — CITALOPRAM HYDROBROMIDE 20 MG PO TABS: 10.0000 mg | ORAL_TABLET | Freq: Every day | ORAL | Status: DC

## 2016-09-05 MED ORDER — PANTOPRAZOLE SODIUM 40 MG PO TBEC: 40.0000 mg | DELAYED_RELEASE_TABLET | Freq: Every day | ORAL | Status: DC

## 2016-09-05 MED ORDER — CLONAZEPAM 0.5 MG PO TABS: 0.5000 mg | ORAL_TABLET | Freq: Two times a day (BID) | ORAL | Status: DC | PRN

## 2016-09-05 MED ORDER — ACETAMINOPHEN 325 MG PO TABS: 650.0000 mg | ORAL_TABLET | Freq: Four times a day (QID) | ORAL | Status: DC | PRN

## 2016-09-05 MED ORDER — LAMOTRIGINE 100 MG PO TABS: 100.0000 mg | ORAL_TABLET | Freq: Every day | ORAL | Status: DC

## 2016-09-05 MED ORDER — LURASIDONE HCL 40 MG PO TABS: 40.0000 mg | ORAL_TABLET | Freq: Every day | ORAL | Status: DC
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
## 2016-09-05 NOTE — [facility] Suicide Risk Assessment (Signed)
[facility]   Principal Problem: Bipolar 1 disorder, depressed [facility](HCC) Discharge Diagnoses:  Patient Active Problem List   Diagnosis Date Noted  . Bipolar 1 disorder, depressed (HCC) [F31.9] 09/05/2016  . Chronic pain [G89.29] 02/21/2016  . Long term current use of opiate analgesic [Z79.891] 02/21/2016  . Asthma [J45.909] 11/06/2015  . Post traumatic stress disorder [F43.10] 07/01/2015      Psychiatric Specialty Exam: ROS  Blood pressure 101/68, pulse 83, temperature 99 F (37.2 C), temperature source Oral, resp. rate 16, height 5\' 3"  (1.6 m), weight 82.6 kg (182 lb), SpO2 100 %.Body mass index is 32.24 kg/m.                                                       Mental Status Per Nursing Assessment::   On Admission:     Demographic Factors:  AAF  Loss Factors: Loss of significant relationship  Historical Factors: Impulsivity  Risk Reduction Factors:   Sense of responsibility to family, Living with another person, especially a relative and Positive social support  Continued Clinical Symptoms:  Depression:   Impulsivity Chronic Pain Previous Psychiatric Diagnoses and Treatments  Cognitive Features That Contribute To Risk:  None    Suicide Risk:  Minimal: No identifiable suicidal ideation.  Patients presenting with no risk factors but with morbid ruminations; may be classified as minimal risk based on the severity of the depressive symptoms    Jimmy FootmanHernandez-Gonzalez,  Kaydance Bowie, MD 09/05/2016, 5:25 PM

## 2016-09-05 NOTE — Progress Notes (Signed)
D:  Patient interacted appropriately with peers and staff.  Patient denies SI/AVH/HI. A:  Patient encouraged to attend groups.  Patient administered scheduled medications.  Patient offered support and encouragement. R:  Patient safety maintained with 15 minute checks.  Patient did not attend groups.

## 2016-09-05 NOTE — Plan of Care (Signed)
Problem: Safety: Goal: Ability to remain free from injury will improve Outcome: Progressing Patient has remained free from injury this shift.  Patient has been provided with a safe environment and safety maintained with 15 minute checks.

## 2016-09-05 NOTE — ED Notes (Signed)
Writer encouraged fluids to support good B/P. Pt acknowledged understanding.
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
## 2016-09-05 NOTE — ED Provider Notes (Signed)
-----------------------------------------   6:25 AM on 09/05/2016 -----------------------------------------   Blood pressure (!) 90/52, pulse 86, temperature 98.3 F (36.8 C), temperature source Oral, resp. rate 16, height 5\' 3"  (1.6 m), weight 185 lb (83.9 kg), SpO2 99 %.  The patient had no acute events since last update.  Calm and cooperative at this time.  Disposition is pending Psychiatry/Behavioral Medicine team recommendations.     Irean HongJade J Sung, MD 09/05/16 [phone]

## 2016-09-05 NOTE — [facility] Group Notes (Signed)
[facility] LCSW Group Therapy   09/05/2016 9:30 am   Type of Therapy: Group Therapy   Participation Level: Invited but did not attend.  Participation Quality: Invited but did not attend.    Sierra AbbotKadijah Eileen Wiley, MSW, LCSW-A 09/05/2016, 11:58AM

## 2016-09-05 NOTE — BH Assessment (Signed)
[facility]. Toni Amendlapacs.  Attending Physician will be Dr. Ardyth HarpsHernandez.   Patient [facility] room [facility] Edwena BundeJanet J.   Intake Paper Work has been signed and placed on patient chart.  ER staff is aware of the admission Misty Stanley(Lisa, ER Sect.; Dr. Pershing ProudSchaevitz, ER MD; Windell Mouldinguth, Patient's Nurse & Rutherford NailIsabel, Patient Access).

## 2016-09-05 NOTE — Progress Notes (Signed)
Discharge: Patient discharged at approximately 1905 pm.  Patient denied SI/AVH/HI at this time.  Patient appeared to be in no acute distress at this time.  Patient discharge instructions and follow-up appointments explained and patient verbalized understanding.  Patient belongings returned and patient acknowledge receipt of those belongings.
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
## 2016-09-05 NOTE — ED Notes (Signed)
Pt is alert and oriented this evening. Pt mood is anxious and affect somewhat bizarre. Pt endorses passive SI but contracts for safety. Pt is calm and cooperative with staff and 15 minute checks are ongoing for safety.

## 2016-09-05 NOTE — ED Notes (Signed)
IVC/Pending Placement 

## 2016-09-05 NOTE — ED Notes (Signed)
Pt is aware she is under ivc and is being adm to [facility] she is agreeable to the plan she has no specific c/o

## 2016-09-05 NOTE — Progress Notes (Signed)
Admission Note:  Patient arrived to the unit at approximately 1125 am.  Patient arrived in scrubs with a steady gait.  Patient speech soft but easily understood. Patient affect is flat and depressed but conversation was relevant with concrete thoughts.  Patient was cooperative with assessment and was oriented to the unit and to her room.  Patient skin assessment was completed with second staff present and no signs and symptoms of skin breakdown was present.  Patient has tattoos on left forearm.  Patient belongings searched and logged by staff.

## 2016-09-06 NOTE — Progress Notes (Signed)
  [facility] Discharge [facility] : Late entry   Will you be returning to the same living situation after discharge:  Yes,  will return home with friend At discharge, do you have transportation home?: Yes,  friend will pick pt up. Do you have the ability to pay for your medications: No.  Release of information consent forms completed and in the chart;  Patient's signature needed at discharge.  Patient to Follow up at: Follow-up Information    [facility]   Why:  please f/u with Dr Elesa MassedWard [facility] also has walk ins M-F Contact information: [address] [place] [address] [phone]           Next level of care provider [facility] Link:no  Safety Planning and Suicide Prevention discussed: Yes,  SPE reviewed with pt and friend  Have you used any form of tobacco in the last 30 days? (Cigarettes, Smokeless Tobacco, Cigars, and/or Pipes): No  Has patient been referred to the Quitline?: N/A patient is not a smoker  Patient has been referred for addiction treatment: N/A  Lynden OxfordKadijah R Averill Winters, MSW, LCSW-A 09/06/2016, 9:30 AM

## 2016-09-06 NOTE — [facility] Suicide Risk Assessment (Signed)
[facility] INPATIENT:  Family/Significant Other Suicide Prevention Education  Suicide Prevention Education:  Education Completed; friend,  Sierra Wiley ph#: [phone]) [phone] has been identified by the patient as the family member/significant other with whom the patient will be residing, and identified as the person(s) who will aid the patient in the event of a mental health crisis (suicidal ideations/suicide attempt).  With written consent from the patient, the family member/significant other has been provided the following suicide prevention education, prior to the and/or following the discharge of the patient.  The suicide prevention education provided includes the following:  Suicide risk factors  Suicide prevention and interventions  National Suicide Hotline telephone number  [facility] assessment telephone number  [facility] Emergency Assistance 911  [facility] and/or Residential Mobile Crisis Unit telephone number  Request made of family/significant other to:  Remove weapons (e.g., guns, rifles, knives), all items previously/currently identified as safety concern.    Remove drugs/medications (over-the-counter, prescriptions, illicit drugs), all items previously/currently identified as a safety concern.  The family member/significant other verbalizes understanding of the suicide prevention education information provided.  The family member/significant other agrees to remove the items of safety concern listed above.  Lynden OxfordKadijah R Leeon Wiley, MSW, LCSW-A 09/06/2016, 9:32 AM

## 2016-10-07 ENCOUNTER — Emergency Department
Admission: EM | Admit: 2016-10-07 | Discharge: 2016-10-07 | Disposition: A | Discharge status: Home / Self Care | Payer: Medicaid Other | Attending: Emergency Medicine | Admitting: Emergency Medicine

## 2016-10-07 DIAGNOSIS — F419 Anxiety disorder, unspecified: Secondary | ICD-10-CM | POA: Insufficient documentation

## 2016-10-07 DIAGNOSIS — G8929 Other chronic pain: Secondary | ICD-10-CM | POA: Diagnosis present

## 2016-10-07 DIAGNOSIS — F19939 Other psychoactive substance use, unspecified with withdrawal, unspecified: Secondary | ICD-10-CM

## 2016-10-07 DIAGNOSIS — F19239 Other psychoactive substance dependence with withdrawal, unspecified: Secondary | ICD-10-CM

## 2016-10-07 DIAGNOSIS — F319 Bipolar disorder, unspecified: Secondary | ICD-10-CM | POA: Diagnosis present

## 2016-10-07 DIAGNOSIS — F4310 Post-traumatic stress disorder, unspecified: Secondary | ICD-10-CM | POA: Diagnosis present

## 2016-10-07 DIAGNOSIS — J45909 Unspecified asthma, uncomplicated: Secondary | ICD-10-CM | POA: Insufficient documentation

## 2016-10-07 DIAGNOSIS — G251 Drug-induced tremor: Secondary | ICD-10-CM

## 2016-10-07 DIAGNOSIS — Z791 Long term (current) use of non-steroidal anti-inflammatories (NSAID): Secondary | ICD-10-CM | POA: Insufficient documentation

## 2016-10-07 NOTE — ED Triage Notes (Signed)
Pt states that she needs to be seen because [facility] thinks she is having a "nervous breakdown". Pt reports losing her job today due to the shaking and being unable to fulfill tasks at works. Pt denies SI or HI. Pt reports that she wants to see medical doctor only.

## 2016-10-07 NOTE — ED Notes (Signed)
Pt had gone to the bathroom, then left and walked out prior to signing discharge paperwork.

## 2016-10-07 NOTE — ED Provider Notes (Signed)
[facility] Emergency Department Provider Note  ____________________________________________  Time seen: Approximately 4:15 PM  I have reviewed the triage vital signs and the nursing notes.   HISTORY  Chief Complaint Anxiety    HPI Sierra Wiley is a 45 y.o. female who complains of anxiety and shaking. She had been taking Klonopin twice a day as needed for the past 30 days, last filled on October 6 according to controlled substance reporting system, has been off of it for several days. Electronic system does show that she has refills as well and could pick this up at the pharmacy whenever she likes.  Denies chest pain shortness of breath or any other acute complaints.     Past Medical History:  Diagnosis Date  . Asthma   . Bipolar 1 disorder (HCC)   . Chronic back pain   . Depression   . Opiate use 02/21/2016  . PTSD (post-traumatic stress disorder)   . Seizures [facility](HCC)      Patient Active Problem List   Diagnosis Date Noted  . Tremor due to drug withdrawal 10/07/2016  . Bipolar 1 disorder, depressed (HCC) 09/05/2016  . Chronic pain 02/21/2016  . Asthma 11/06/2015  . Post traumatic stress disorder 07/01/2015     Past Surgical History:  Procedure Laterality Date  . CHOLECYSTECTOMY    . ELBOW SURGERY Left 2000  . TUBAL LIGATION       Prior to Admission medications   Medication Sig Start Date End Date Taking? Authorizing Provider  baclofen (LIORESAL) 10 MG tablet Take 1 tablet (10 mg total) by mouth 3 (three) times daily. 08/02/16   Charmayne Sheerharles M Beers, PA-C  citalopram (CELEXA) 10 MG tablet Take 10 mg by mouth daily.    Historical Provider, MD  clonazePAM (KLONOPIN) 0.5 MG tablet Take 1 tablet (0.5 mg total) by mouth 2 (two) times daily as needed for anxiety. 09/02/16   Governor Rooksebecca Lord, MD  lamoTRIgine (LAMICTAL) 100 MG tablet Take 100 mg by mouth daily.    Historical Provider, MD  lurasidone (LATUDA) 40 MG TABS tablet Take 20 mg by mouth daily  with breakfast.    Historical Provider, MD  meloxicam (MOBIC) 15 MG tablet Take 1 tablet (15 mg total) by mouth daily. 08/02/16   Charmayne Sheerharles M Beers, PA-C  omeprazole (PRILOSEC OTC) 20 MG tablet Take 20 mg by mouth daily.    Historical Provider, MD     Allergies Penicillins   Family History  Problem Relation Age of Onset  . Alcohol abuse Mother   . Drug abuse Mother   . HIV Mother   . Bipolar disorder Mother   . Cirrhosis Mother   . Cancer Father   . Heart disease Father   . Drug abuse Sister   . Alcohol abuse Sister     Social History Social History  Substance Use Topics  . Smoking status: Never Smoker  . Smokeless tobacco: Never Used  . Alcohol use No    Review of Systems  Constitutional:   No fever or chills.  ENT:   No sore throat. No rhinorrhea. Cardiovascular:   No chest pain. Respiratory:   No dyspnea or cough. Gastrointestinal:   Negative for abdominal pain, vomiting and diarrhea.  Neurologic: No headaches or weakness 10-point ROS otherwise negative.  ____________________________________________   PHYSICAL EXAM:  VITAL SIGNS: ED Triage Vitals [10/07/16 1451]  Enc Vitals Group     BP (!) 117/49     Pulse Rate 72     Resp 18  Temp 98.6 F (37 C)     Temp Source Oral     SpO2 100 %     Weight 189 lb (85.7 kg)     Height 5\' 3"  (1.6 m)     Head Circumference      Peak Flow      Pain Score      Pain Loc      Pain Edu?      Excl. in GC?     Vital signs reviewed, nursing assessments reviewed.   Constitutional:   Alert and oriented. Well appearing and in no distress.  ENT   Head:   Normocephalic and atraumatic. Cardiovascular:   RRR. Symmetric bilateral radial and DP pulses.  No murmurs.  Respiratory:   Normal respiratory effort without tachypnea nor retractions. Breath sounds are clear and equal bilaterally. No wheezes/rales/rhonchi. Gastrointestinal:   Soft and nontender. Non distended. There is no CVA tenderness.  No rebound, rigidity, or  guarding. Genitourinary:   deferred Musculoskeletal:   Nontender with normal range of motion in all extremities. No joint effusions.  No lower extremity tenderness.  No edema. Neurologic:   Normal speech and language.  CN 2-10 normal. Motor grossly intact. Intermittent generalized shaking, no fine tremor. Steady gait No gross focal neurologic deficits are appreciated.  Skin:    Skin is warm, dry and intact. No rash noted.  No petechiae, purpura, or bullae.  ____________________________________________    LABS (pertinent positives/negatives) (all labs ordered are listed, but only abnormal results are displayed) Labs Reviewed - No data to display ____________________________________________   EKG    ____________________________________________    RADIOLOGY    ____________________________________________   PROCEDURES Procedures  ____________________________________________   INITIAL IMPRESSION / ASSESSMENT AND PLAN / ED COURSE  Pertinent labs & imaging results that were available during my care of the patient were reviewed by me and considered in my medical decision making (see chart for details).  Patient well appearing no acute distress. Presents with anxiety and some shaking, but her presentation is not consistent with benzodiazepine withdrawal. No evidence of any acute emergency medical condition at this time. Patient does have refills available to her for her Klonopin medicine, which should treat the symptoms very effectively as she's been on this for a long time. Patient advised to fill this medicine as soon as possible and then follow-up with her doctor, Dr. Elesa Massed, at [facility]. Case discussed with psychiatry Dr. Toni Amend in the ED and he agrees.     Clinical Course   ____________________________________________   FINAL CLINICAL IMPRESSION(S) / ED DIAGNOSES  Final diagnoses:  Anxiety  Medication noncompliance     Portions of this note were generated with  dragon dictation software. Dictation errors may occur despite best attempts at proofreading.    Sharman Cheek, MD 10/07/16 [phone]

## 2016-10-07 NOTE — Consult Note (Signed)
[facility] Face-to-Face Psychiatry Consult   Reason for Consult:  Consult for 45 year old woman with a history of bipolar disorder who came voluntarily to the emergency room because of a tremor Referring Physician:  Scotty Court Patient Identification: Sierra Wiley MRN:  [id number] Principal Diagnosis: Tremor due to drug withdrawal Diagnosis:   Patient Active Problem List   Diagnosis Date Noted  . Tremor due to drug withdrawal [G25.1] 10/07/2016  . Bipolar 1 disorder, depressed (HCC) [F31.9] 09/05/2016  . Chronic pain [G89.29] 02/21/2016  . Asthma [J45.909] 11/06/2015  . Post traumatic stress disorder [F43.10] 07/01/2015    Total Time spent with patient: 45 minutes  Subjective:   Sierra Wiley is a 45 y.o. female patient admitted with "they sent me here from [facility] for a higher level of care".  HPI:  Patient seen. Chart reviewed. This 45 year old woman came voluntarily to the emergency room. She said that she went to [facility] today and told them that she had a tremor and that they sent her by the crisis team over here to the emergency room but did not file commitment. The story is a little hard to follow. She says she is still taking the medicine she was on when she left the hospital last time. She started having a tremor about a week or so after leaving. 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 Her mood is feeling nervous. Only slightly down and sad. She doesn't sleep very well at night. Her appetite is okay. She denies having any thoughts at all about wanting to kill her self for herself. Denies any thoughts of hurting anyone else. Denies having any hallucinations or psychotic symptoms. Not using alcohol or any other drugs. Patient does not think she needs to be in the hospital. I ask her if she was still taking her clonazepam and she says she was not because she did not think she was supposed to be on it.  Social history: Has been living with her mother  and daughter. She is afraid she will have no place to stay if she doesn't have a job. Her [employer]  Medical history: Asthma chronic pain  Substance abuse history: Denies any current alcohol use. Said that she used to drink a while ago. Doesn't use any other drugs.  Past Psychiatric History: Patient has had prior visits to the emergency room and had 1 prior hospitalization but for only 24 hours last month. She has a diagnosis of bipolar disorder with depression and prominent and PTSD. Last time she had serious suicidal ideation was about 11 years ago. No history of violence.  Risk to Self: Is patient at risk for suicide?: No Risk to Others:   Prior Inpatient Therapy:   Prior Outpatient Therapy:    Past Medical History:  Past Medical History:  Diagnosis Date  . Asthma   . Bipolar 1 disorder (HCC)   . Chronic back pain   . Depression   . Opiate use 02/21/2016  . PTSD (post-traumatic stress disorder)   . Seizures (HCC)     Past Surgical History:  Procedure Laterality Date  . CHOLECYSTECTOMY    . ELBOW SURGERY Left 2000  . TUBAL LIGATION     Family History:  Family History  Problem Relation Age of Onset  . Alcohol abuse Mother   . Drug abuse Mother   . HIV Mother   . Bipolar disorder Mother   . Cirrhosis Mother   . Cancer Father   .  Heart disease Father   . Drug abuse Sister   . Alcohol abuse Sister    Family Psychiatric  History: Says that her mother was bipolar disorder and also had substance abuse problems Social History:  History  Alcohol Use No     History  Drug Use No    Social History   Social History  . Marital status: Married    Spouse name: N/A  . Number of children: N/A  . Years of education: N/A   Social History Main Topics  . Smoking status: Never Smoker  . Smokeless tobacco: Never Used  . Alcohol use No  . Drug use: No  . Sexual activity: Yes    Birth control/ protection: Condom   Other Topics Concern  . None   Social History Narrative  .  None   Additional Social History:    Allergies:   Allergies  Allergen Reactions  . Penicillins Nausea And Vomiting    Labs: No results found for this or any previous visit (from the past 48 hour(s)).  No current facility-administered medications for this encounter.    Current Outpatient Prescriptions  Medication Sig Dispense Refill  . baclofen (LIORESAL) 10 MG tablet Take 1 tablet (10 mg total) by mouth 3 (three) times daily. 30 tablet 0  . citalopram (CELEXA) 10 MG tablet Take 10 mg by mouth daily.    . clonazePAM (KLONOPIN) 0.5 MG tablet Take 1 tablet (0.5 mg total) by mouth 2 (two) times daily as needed for anxiety. 5 tablet 0  . lamoTRIgine (LAMICTAL) 100 MG tablet Take 100 mg by mouth daily.    Marland Kitchen. lurasidone (LATUDA) 40 MG TABS tablet Take 20 mg by mouth daily with breakfast.    . meloxicam (MOBIC) 15 MG tablet Take 1 tablet (15 mg total) by mouth daily. 30 tablet 0  . omeprazole (PRILOSEC OTC) 20 MG tablet Take 20 mg by mouth daily.      Musculoskeletal: Strength & Muscle Tone: within normal limits Gait & Station: normal Patient leans: N/A  Psychiatric Specialty Exam: Physical Exam  Nursing note and vitals reviewed. Constitutional: She appears well-developed and well-nourished.  HENT:  Head: Normocephalic and atraumatic.  Eyes: Conjunctivae are normal. Pupils are equal, round, and reactive to light.  Neck: Normal range of motion.  Cardiovascular: Regular rhythm and normal heart sounds.   Respiratory: Effort normal. No respiratory distress.  GI: Soft.  Musculoskeletal: Normal range of motion.  Neurological: She is alert.  Skin: Skin is warm and dry.  Psychiatric: Her affect is blunt. Her speech is delayed. She is slowed. Thought content is not paranoid. She expresses impulsivity. She expresses no homicidal and no suicidal ideation. She exhibits abnormal recent memory.    Review of Systems  Constitutional: Negative.   HENT: Negative.   Eyes: Negative.    Respiratory: Negative.   Cardiovascular: Negative.   Gastrointestinal: Negative.   Musculoskeletal: Negative.   Skin: Negative.   Neurological: Negative.   Psychiatric/Behavioral: Negative for depression, hallucinations, memory loss, substance abuse and suicidal ideas. The patient is nervous/anxious and has insomnia.     Blood pressure (!) 117/49, pulse 72, temperature 98.6 F (37 C), temperature source Oral, resp. rate 18, height 5\' 3"  (1.6 m), weight 85.7 kg (189 lb), last menstrual period 10/07/2016, SpO2 100 %.Body mass index is 33.48 kg/m.  General Appearance: Casual  Eye Contact:  Fair  Speech:  Slow  Volume:  Decreased  Mood:  Anxious  Affect:  Blunt  Thought Process:  Goal Directed  Orientation:  Full (Time, Place, and Person)  Thought Content:  Logical  Suicidal Thoughts:  No  Homicidal Thoughts:  No  Memory:  Immediate;   Fair Recent;   Fair Remote;   Fair  Judgement:  Fair  Insight:  Shallow  Psychomotor Activity:  Decreased  Concentration:  Concentration: Fair  Recall:  [employer] of Knowledge:  Fair  Language:  Fair  Akathisia:  No  Handed:  Right  AIMS (if indicated):     Assets:  Desire for Improvement Housing Physical Health Resilience  ADL's:  Intact  Cognition:  Impaired,  Mild  Sleep:        Treatment Plan Summary: Daily contact with patient to assess and evaluate symptoms and progress in treatment, Medication management and Plan This is a 45 year old woman with a history of bipolar disorder with depression and also borderline intellectual impairment chronically. Presents with a tremor. First time we spoke to her she had a gross tremor in her hands but when I observed her later it seems to of died down quite a bit. She is not reporting suicidal or homicidal ideation or psychosis. Patient does not require inpatient level treatment. When I learned she was not taking her Klonopin anymore I told her that she has 2 refills left on it and she was supposed  to be on it at discharge. Encouraged her to go back and get that medicine filled. She agrees to that. She will follow-up with [facility]. No need for inpatient treatment. Case reviewed with emergency room doctor.  Disposition: Patient does not meet criteria for psychiatric inpatient admission. Supportive therapy provided about ongoing stressors.  Mordecai Rasmussen, MD 10/07/2016 4:05 PM

## 2016-10-10 ENCOUNTER — Emergency Department: Payer: Self-pay

## 2016-10-10 ENCOUNTER — Emergency Department
Admission: EM | Admit: 2016-10-10 | Discharge: 2016-10-10 | Disposition: A | Discharge status: Home / Self Care | Payer: Self-pay | Attending: Emergency Medicine | Admitting: Emergency Medicine

## 2016-10-10 DIAGNOSIS — J45909 Unspecified asthma, uncomplicated: Secondary | ICD-10-CM | POA: Insufficient documentation

## 2016-10-10 DIAGNOSIS — R251 Tremor, unspecified: Secondary | ICD-10-CM | POA: Insufficient documentation

## 2016-10-10 LAB — COMPREHENSIVE METABOLIC PANEL
ALK PHOS: 40 U/L (ref 38–126)
ALT: 13 U/L — ABNORMAL LOW (ref 14–54)
ANION GAP: 6 (ref 5–15)
AST: 19 U/L (ref 15–41)
Albumin: 4.2 g/dL (ref 3.5–5.0)
BUN: 14 mg/dL (ref 6–20)
CALCIUM: 9.4 mg/dL (ref 8.9–10.3)
CO2: 25 mmol/L (ref 22–32)
Chloride: 104 mmol/L (ref 101–111)
Creatinine, Ser: 1.04 mg/dL — ABNORMAL HIGH (ref 0.44–1.00)
GFR calc non Af Amer: >60 mL/min (ref >60)
Glucose, Bld: 90 mg/dL (ref 65–99)
Potassium: 4.2 mmol/L (ref 3.5–5.1)
SODIUM: 135 mmol/L (ref 135–145)
TOTAL PROTEIN: 8 g/dL (ref 6.5–8.1)
Total Bilirubin: 0.9 mg/dL (ref 0.3–1.2)

## 2016-10-10 LAB — CBC WITH DIFFERENTIAL/PLATELET
Basophils Absolute: 0 K/uL (ref 0–0.1)
Basophils Relative: 1 %
Eosinophils Absolute: 0.1 K/uL (ref 0–0.7)
Eosinophils Relative: 1 %
HCT: 39.1 % (ref 35.0–47.0)
Hemoglobin: 13.5 g/dL (ref 12.0–16.0)
Lymphocytes Relative: 46 %
Lymphs Abs: 2.1 K/uL (ref 1.0–3.6)
MCH: 31.7 pg (ref 26.0–34.0)
MCHC: 34.6 g/dL (ref 32.0–36.0)
MCV: 91.7 fL (ref 80.0–100.0)
Monocytes Absolute: 0.3 K/uL (ref 0.2–0.9)
Monocytes Relative: 7 %
Neutro Abs: 2 K/uL (ref 1.4–6.5)
Neutrophils Relative %: 45 %
Platelets: 294 K/uL (ref 150–440)
RBC: 4.27 MIL/uL (ref 3.80–5.20)
RDW: 14.9 % — ABNORMAL HIGH (ref 11.5–14.5)
WBC: 4.5 K/uL (ref 3.6–11.0)

## 2016-10-10 LAB — URINALYSIS COMPLETE WITH MICROSCOPIC (ARMC ONLY)
BILIRUBIN URINE: NEGATIVE
GLUCOSE, UA: NEGATIVE mg/dL
KETONES UR: NEGATIVE mg/dL
NITRITE: NEGATIVE
PH: 5 (ref 5.0–8.0)
Protein, ur: NEGATIVE mg/dL
SPECIFIC GRAVITY, URINE: 1.008 (ref 1.005–1.030)

## 2016-10-10 NOTE — ED Provider Notes (Signed)
[facility] Emergency Department Provider Note        Time seen: ----------------------------------------- 12:33 PM on 10/10/2016 -----------------------------------------    I have reviewed the triage vital signs and the nursing notes.   HISTORY  Chief Complaint Tremors    HPI [name] is a 45 y.o. female who presents to ER for tremors that are worse today. Patient states she has had a tremor for about a month,saw her primary care doctor yesterday and was referring her to a neurologist. 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 Patient reports she has stopped working because of the tremor. She reports she has had medication changes in the last 6 months that involve antipsychotics.   Past Medical History:  Diagnosis Date  . Asthma   . Bipolar 1 disorder (HCC)   . Chronic back pain   . Depression   . Opiate use 02/21/2016  . PTSD (post-traumatic stress disorder)   . Seizures [facility](HCC)     Patient Active Problem List   Diagnosis Date Noted  . Tremor due to drug withdrawal 10/07/2016  . Bipolar 1 disorder, depressed (HCC) 09/05/2016  . Chronic pain 02/21/2016  . Asthma 11/06/2015  . Post traumatic stress disorder 07/01/2015    Past Surgical History:  Procedure Laterality Date  . CHOLECYSTECTOMY    . ELBOW SURGERY Left 2000  . TUBAL LIGATION      Allergies Penicillins  Social History Social History  Substance Use Topics  . Smoking status: Never Smoker  . Smokeless tobacco: Never Used  . Alcohol use No    Review of Systems Constitutional: Negative for fever. Cardiovascular: Negative for chest pain. Respiratory: Negative for shortness of breath. Gastrointestinal: Negative for abdominal pain, vomiting and diarrhea. Genitourinary: Negative for dysuria. Musculoskeletal: Negative for back pain. Skin: Negative for rash. Neurological: Negative for  headaches, focal weakness or numbness.Positive for tremor  10-point ROS otherwise negative.  ____________________________________________   PHYSICAL EXAM:  VITAL SIGNS: ED Triage Vitals  Enc Vitals Group     BP 10/10/16 1044 (!) 103/58     Pulse Rate 10/10/16 1044 72     Resp 10/10/16 1044 20     Temp 10/10/16 1044 98.1 F (36.7 C)     Temp Source 10/10/16 1044 Oral     SpO2 10/10/16 1044 100 %     Weight 10/10/16 1045 189 lb (85.7 kg)     Height 10/10/16 1045 5\' 8"  (1.727 m)     Head Circumference --      Peak Flow --      Pain Score 10/10/16 1049 7     Pain Loc --      Pain Edu? --      Excl. in GC? --     Constitutional: Alert and oriented. Well appearing and in no distress. Eyes: Conjunctivae are normal. PERRL. Normal extraocular movements. ENT   Head: Normocephalic and atraumatic.   Nose: No congestion/rhinnorhea.   Mouth/Throat: Mucous membranes are moist.   Neck: No stridor. Cardiovascular: Normal rate, regular rhythm. No murmurs, rubs, or gallops. Respiratory: Normal respiratory effort without tachypnea nor retractions. Breath sounds are clear and equal bilaterally. No wheezes/rales/rhonchi. Gastrointestinal: Soft and nontender. Normal bowel sounds Musculoskeletal: Nontender with normal range of motion in all extremities. No lower extremity tenderness nor edema. Neurologic:  Normal speech and language. No gross focal neurologic deficits are appreciated. Resting tremors noted Skin:  Skin is warm, dry and intact. No rash noted. Psychiatric: Mood and affect are normal. Speech and behavior are normal.  ____________________________________________  ED COURSE:  Pertinent labs & imaging results that were available during my care of the patient were reviewed by me and considered in my medical decision making (see chart for details). Clinical Course  Patient presents to ER with chronic tremor. We will assess with basic labs, this is likely medication  related.  Procedures ____________________________________________   LABS (pertinent positives/negatives)  Labs Reviewed  CBC WITH DIFFERENTIAL/PLATELET - Abnormal; Notable for the following:       Result Value   RDW 14.9 (*)    All other components within normal limits  COMPREHENSIVE METABOLIC PANEL - Abnormal; Notable for the following:    Creatinine, Ser 1.04 (*)    ALT 13 (*)    All other components within normal limits  URINALYSIS COMPLETEWITH MICROSCOPIC ([facility] ONLY) - Abnormal; Notable for the following:    Color, Urine STRAW (*)    APPearance CLEAR (*)    Hgb urine dipstick 2+ (*)    Leukocytes, UA 3+ (*)    Bacteria, UA RARE (*)    Squamous Epithelial / LPF 0-5 (*)    All other components within normal limits    RADIOLOGY  Ct head IMPRESSION: Normal head CT.  ____________________________________________  FINAL ASSESSMENT AND PLAN  Tremor  Plan: Patient with labs and imaging as dictated above. Patient's in no acute distress, tremor is likely secondary to antipsychotic use and extrapyramidal symptoms. She will likely need to come off of her Abilify. She will need close outpatient follow-up with her doctor to decide on an alternative.   Emily Filbert, MD   Note: This dictation was prepared with Dragon dictation. Any transcriptional errors that result from this process are unintentional    Emily Filbert, MD 10/10/16 [phone]

## 2016-10-10 NOTE — ED Triage Notes (Signed)
Previous note charted under crystal williams name.

## 2016-10-10 NOTE — ED Triage Notes (Signed)
Says tremors worse today.  Seen here for same and Sierra Wiley drew is setting up neuro follow up.

## 2016-10-10 NOTE — ED Notes (Signed)
Pt to ct 

## 2016-10-10 NOTE — ED Notes (Signed)
Pt reports migraines every other day x 7-8 months. Had one last night that lasted 45-60 minutes.

## 2016-10-10 NOTE — ED Notes (Signed)
Arrived via wheelchair to triage by EMS.  Reports tremors.  Recently started meds for tremors but not helping.  Pt a&o, NAD

## 2016-10-10 NOTE — ED Notes (Signed)
Pt presents with increasing tremors x 1 month. States she saw PCP yesterday, who is referring her to neurologist, but she does not yet have appointment. Pt states she has tremors in both hands and both legs. Had epilepsy as child, but has not had seizure in several years. 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 Pt alert & oriented with NAD noted.

## 2016-10-17 ENCOUNTER — Ambulatory Visit: Payer: Self-pay | Admitting: Pharmacist

## 2016-10-17 ENCOUNTER — Encounter: Payer: Self-pay | Admitting: Pharmacist

## 2016-10-17 VITALS — BP 130/75 | Ht 64.5 in | Wt 189.0 lb

## 2016-10-17 DIAGNOSIS — Z79899 Other long term (current) drug therapy: Secondary | ICD-10-CM

## 2016-10-17 NOTE — Patient Instructions (Signed)

## 2016-10-17 NOTE — Addendum Note (Signed)
Addended by: Denice ParadiseHOLT, CHRISTAN T on: 10/17/2016 12:03 PM   Modules accepted: Orders

## 2016-10-17 NOTE — Progress Notes (Addendum)
  Medication Management Clinic Visit Note  Patient: Sierra PantherFelicia Venning MRN: [id number] Date of Birth: 1971/04/13 PCP: Sandrea Hughsubio, Jessica, NP   [facility] 45 [facility] presents for a Intital MTM today. Objective of visit today was to reconcile meds and assess compliance/adherance of medication regimen. Pt knew name of each medication, each indication, and each frequency. Objective completed.  BP 130/75   Ht 5' 4.5" (1.638 m)   Wt 189 lb (85.7 kg)   LMP 10/07/2016 Comment: tubal ligation  BMI 31.94 kg/m   Patient Information   Past Medical History:  Diagnosis Date  . Asthma   . Bipolar 1 disorder (HCC)   . Chronic back pain   . Depression   . Opiate use 02/21/2016  . PTSD (post-traumatic stress disorder)   . Seizures (HCC)       Past Surgical History:  Procedure Laterality Date  . CHOLECYSTECTOMY    . ELBOW SURGERY Left 2000  . TUBAL LIGATION       Family History  Problem Relation Age of Onset  . Alcohol abuse Mother   . Drug abuse Mother   . HIV Mother   . Bipolar disorder Mother   . Cirrhosis Mother   . Cancer Father   . Heart disease Father   . Drug abuse Sister   . Alcohol abuse Sister     New Diagnoses (since last visit):   Family Support: good,has 433 year old son with Bi-polar whom goes to [facility]. Attending visit today with the pt is Bjorn Loserhonda,  her peer support specialist from [facility]. Pt is currently utilizing the [employer] by Darryll CapersMary Allen Copleland for her mental health.  Lifestyle Diet: Breakfast: potatoes and hamburger Lunch:hamburger Dinner:chicken, protein, veggies Drinks: soda, water    Discussed better options for breakfast including eggs, whole toast, peanut butter. Pt is open to a referral to see a dietician. Will need referral from Phineas Realharles Drew.       History  Alcohol Use No  no    History  Smoking Status  . Never Smoker  Smokeless Tobacco  . Never Used      Health Maintenance  Topic Date Due  . HIV Screening  04/25/1988  . TETANUS/TDAP   04/25/1992  . PAP SMEAR  04/25/1994  . INFLUENZA VACCINE  07/30/2016     Assessment and Plan: Patient directed goals for today include: 1) Continued compliance with medications for bi-polar. Continued work with peer support and pharmacy to achieve for mental wellness and fewer side effects. 2) Weight loss goal of 170lbs.
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
## 2016-10-29 IMAGING — CR DG LUMBAR SPINE COMPLETE W/ BEND
1 series · 7 of 7 positions shown · non-contrast
Comparison: Lumbar spine 05/07/2013 an MRI 08/06/2013

CLINICAL DATA: Chronic low back pain

EXAM:
LUMBAR SPINE - COMPLETE WITH BENDING VIEWS

[Series 9: t lumbar spine ap · 0.14mm/px · 7 of 7 slices shown]
[im 1/7]
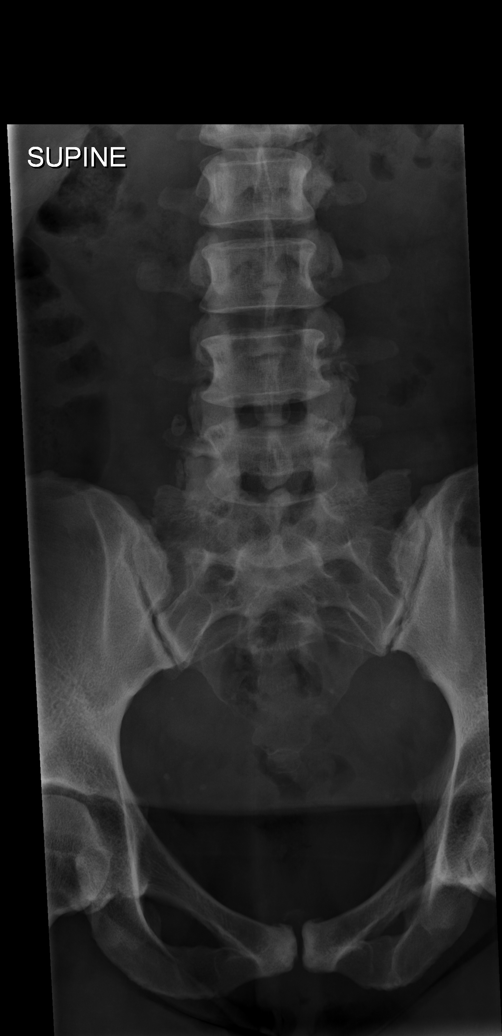
[im 2/7]
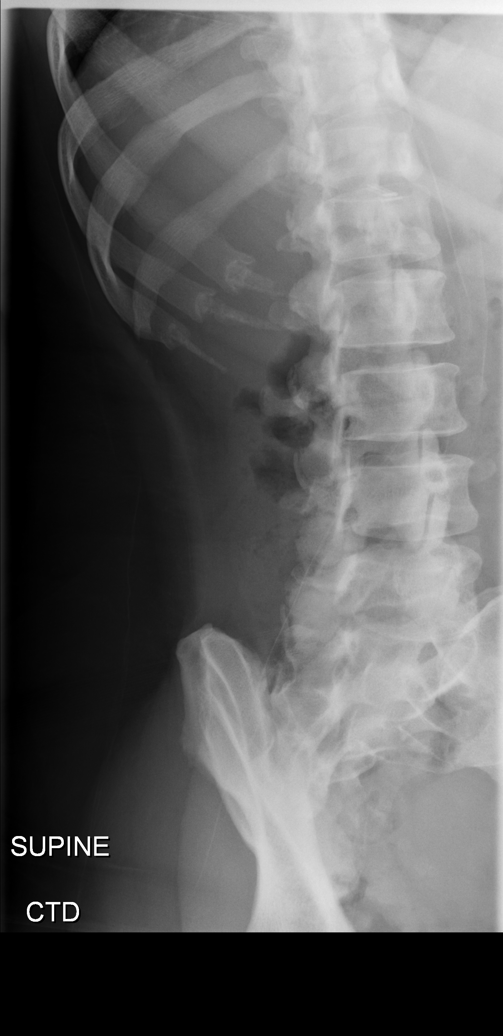
[im 3/7]
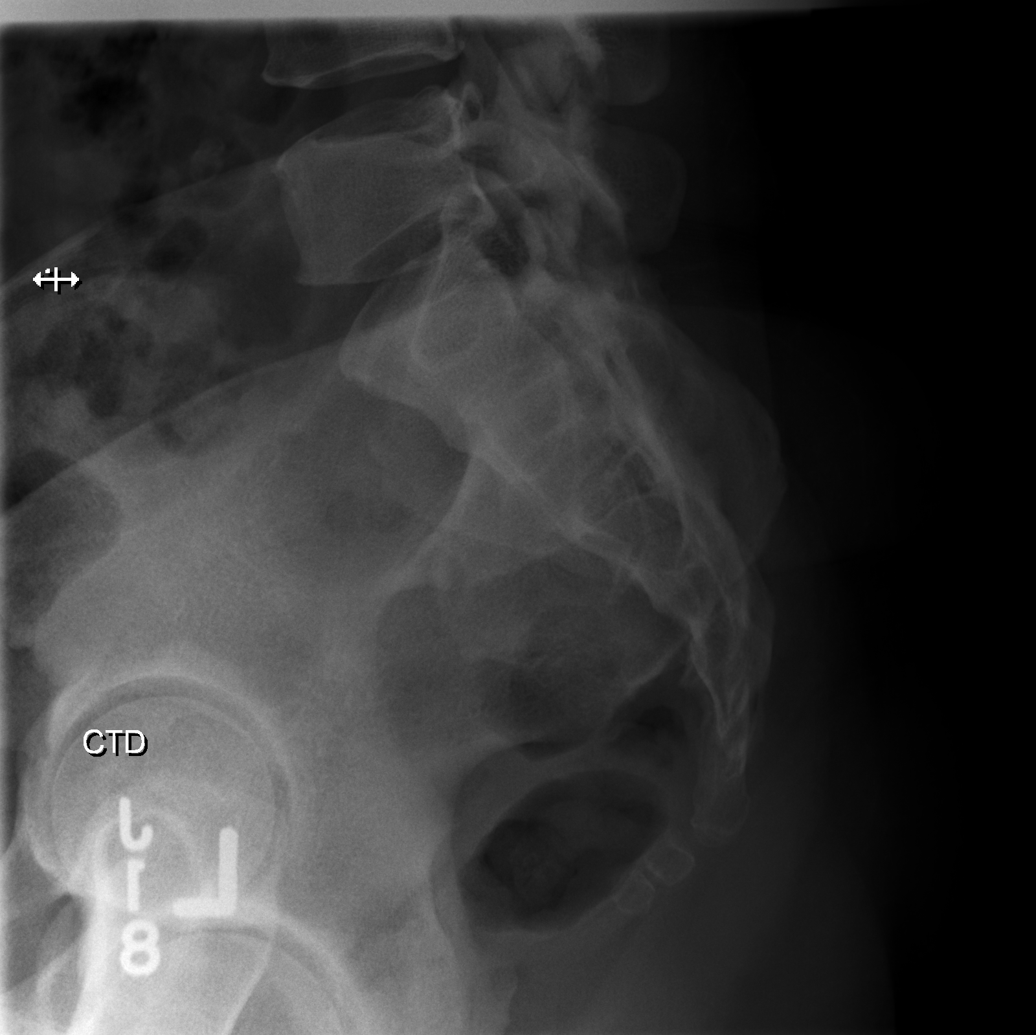
[im 4/7]
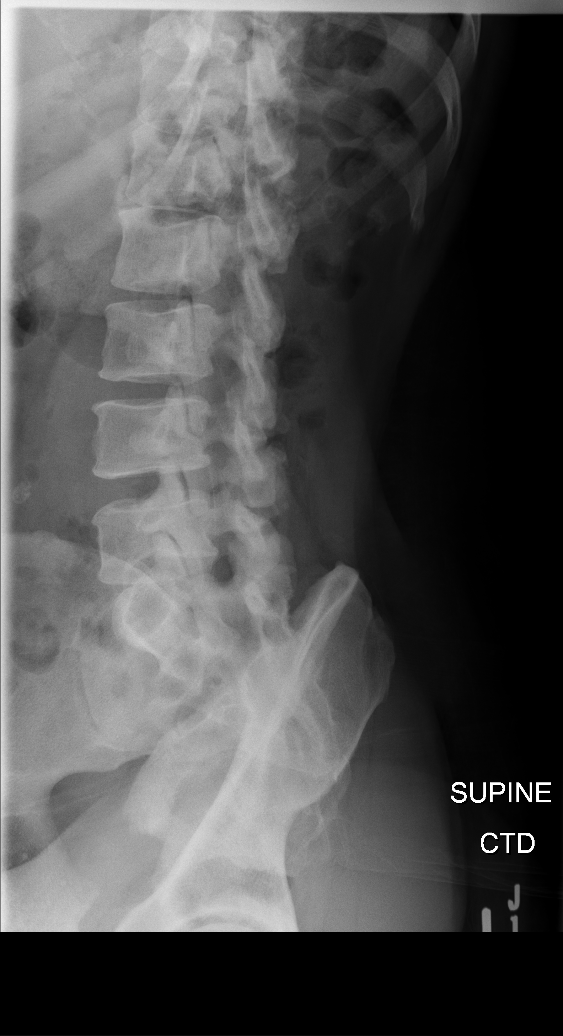
[im 5/7]
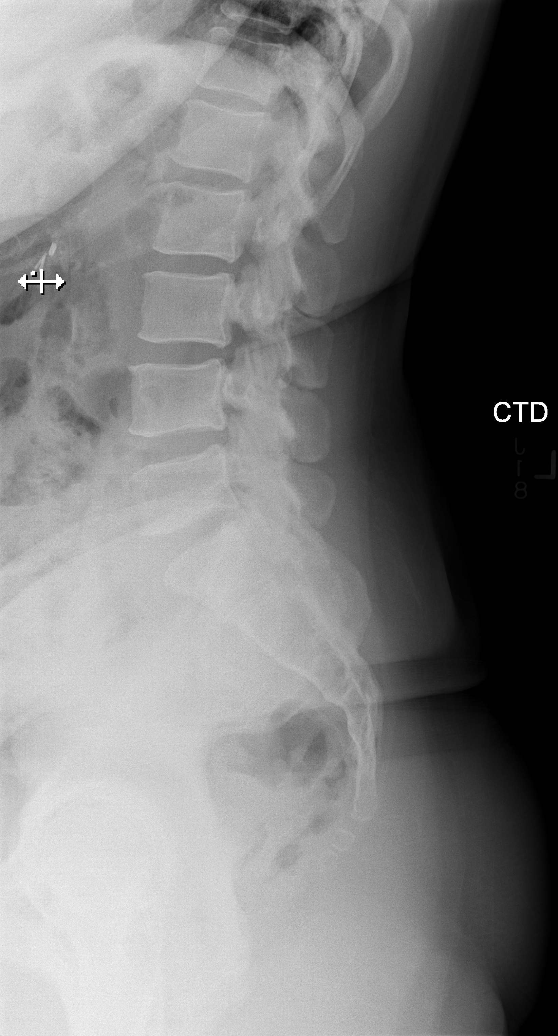
[im 6/7]
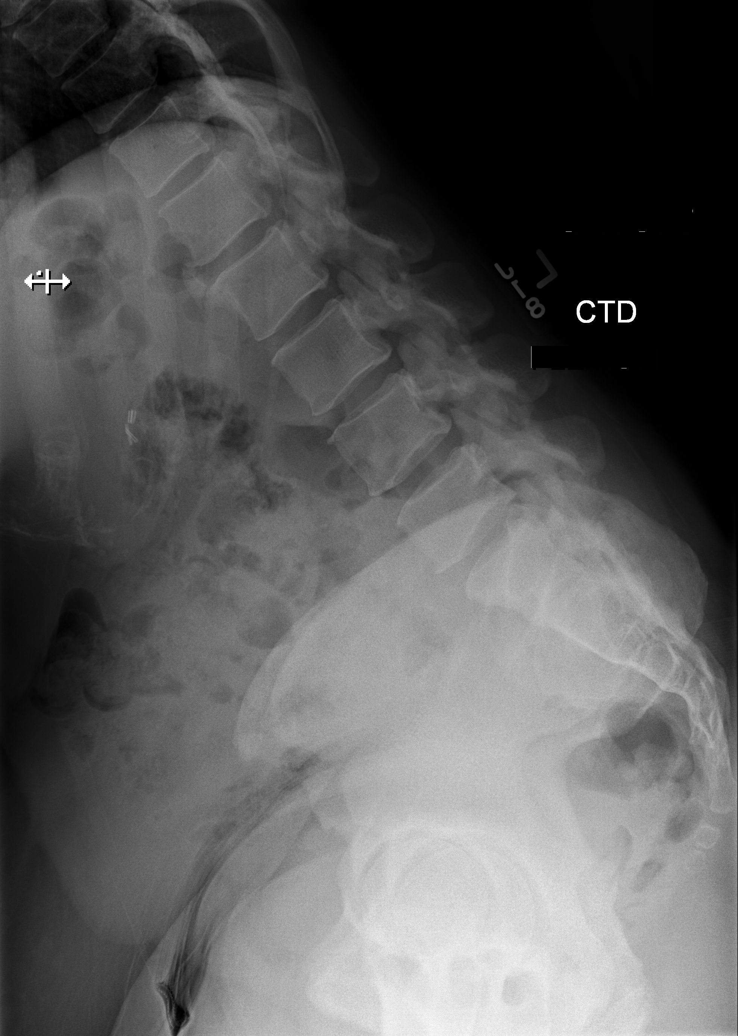
[im 7/7]
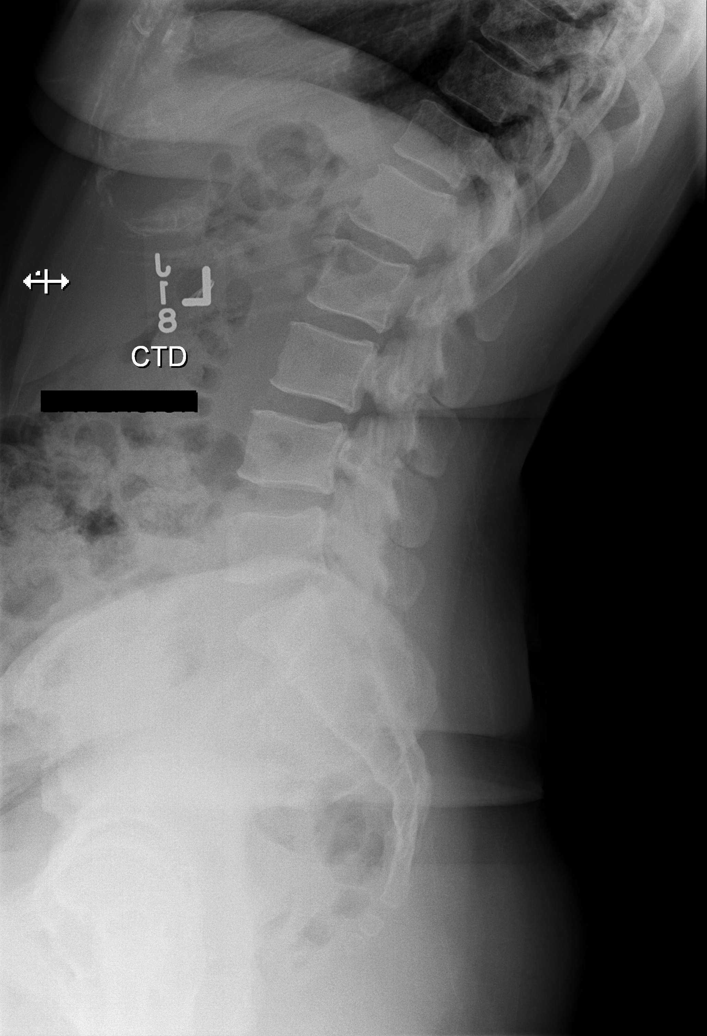

[7 of 7 positions shown; findings below may reference images not displayed]

FINDINGS: Seven views of the lumbar spine including flexion-extension views
submitted. There is no evidence of acute fracture or subluxation.
Alignment and vertebral body heights are preserved. Minimal anterior
spurring upper endplate of L2, L3 and L4 vertebral body. Mild disc
space flattening at L5-S1 level. Mild facet degenerative changes are
noted L4 and L5 level. Flexion-extension views shows no evidence of
lumbar instability.
IMPRESSION: No acute fracture or subluxation. Mild disc space flattening at
L5-S1 level. Minimal anterior spurring upper endplate of L2, L3 and
L4 vertebral body. There is disc space flattening at L5-S1 level.
Facet degenerative changes are noted L4 and L5 level. No evidence of
lumbar instability on flexion-extension views.

## 2017-04-17 ENCOUNTER — Encounter: Payer: Self-pay | Admitting: Pharmacist

## 2018-01-06 ENCOUNTER — Emergency Department
Admission: EM | Admit: 2018-01-06 | Discharge: 2018-01-06 | Disposition: A | Discharge status: Home / Self Care | Payer: Self-pay | Attending: Emergency Medicine | Admitting: Emergency Medicine

## 2018-01-06 ENCOUNTER — Other Ambulatory Visit: Payer: Self-pay

## 2018-01-06 ENCOUNTER — Emergency Department: Payer: Self-pay

## 2018-01-06 DIAGNOSIS — J181 Lobar pneumonia, unspecified organism: Secondary | ICD-10-CM | POA: Insufficient documentation

## 2018-01-06 DIAGNOSIS — J45909 Unspecified asthma, uncomplicated: Secondary | ICD-10-CM | POA: Insufficient documentation

## 2018-01-06 DIAGNOSIS — Z79899 Other long term (current) drug therapy: Secondary | ICD-10-CM | POA: Insufficient documentation

## 2018-01-06 DIAGNOSIS — J189 Pneumonia, unspecified organism: Secondary | ICD-10-CM

## 2018-01-06 MED ORDER — BENZONATATE 100 MG PO CAPS: 200.0000 mg | ORAL_CAPSULE | Freq: Three times a day (TID) | ORAL | 0 refills | Status: DC | PRN

## 2018-01-06 MED ORDER — AZITHROMYCIN 250 MG PO TABS: ORAL_TABLET | ORAL | 0 refills | Status: AC

## 2018-01-06 MED ORDER — HYDROCOD POLST-CPM POLST ER 10-8 MG/5ML PO SUER: 5.0000 mL | Freq: Every evening | ORAL | 0 refills | Status: DC | PRN

## 2018-01-06 NOTE — ED Triage Notes (Addendum)
First Nurse Note: Pt to ed via ems from [place].  Pt reports she had the flu 2 months ago and has had coughing and congestion since.  Pt ambulatory at triage. Appears in no acute resp distress.  No wheezing noted, no labored resp noted. Pt sitting in wheelchair in waiting room at this time.

## 2018-01-06 NOTE — ED Triage Notes (Signed)
Pt to ER c/o cough that she cannot get relief from. Pt states she had flu X 2 moths ago and has had sx since. Yellow productive cough and blood tinged. Pt alert and oriented X4, active, cooperative, pt in NAD. RR even and unlabored, color WNL.

## 2018-01-06 NOTE — ED Provider Notes (Signed)
[facility] Emergency Department Provider Note   ____________________________________________   First MD Initiated Contact with Patient 01/06/18 1719     (approximate)  I have reviewed the triage vital signs and the nursing notes.   HISTORY  Chief Complaint Cough    HPI Sierra Wiley is a 47 y.o. female patient presents with productive cough for 2 months. Patient also recalls and she was diagnosed with flu 2 months ago. Patient states cough is productive and yellow and blood tinge in color . Patient states chest wall pain secondary to coughing. Patient denies nausea, vomiting, diarrhea. Patient over-the-counter medications.Patient rates pain as 5/10. Patient stated pain is worse when laying down.   Past Medical History:  Diagnosis Date  . Asthma   . Bipolar 1 disorder (HCC)   . Chronic back pain   . Depression   . Opiate use 02/21/2016  . PTSD (post-traumatic stress disorder)   . Seizures [facility])     Patient Active Problem List   Diagnosis Date Noted  . 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 Bipolar 1 disorder, depressed (HCC) 09/05/2016  . Chronic pain 02/21/2016  . Asthma 11/06/2015  . Post traumatic stress disorder 07/01/2015    Past Surgical History:  Procedure Laterality Date  . CHOLECYSTECTOMY    . ELBOW SURGERY Left 2000  . TUBAL LIGATION      Prior to Admission medications   Medication Sig Start Date End Date Taking? Authorizing Provider  albuterol (PROVENTIL HFA;VENTOLIN HFA) 108 (90 Base) MCG/ACT inhaler Inhale 2 puffs into the lungs every 6 (six) hours as needed for wheezing or shortness of breath.    [provider]  azithromycin (ZITHROMAX Z-PAK) 250 MG tablet Take 2 tablets (500 mg) on  Day 1,  followed by 1 tablet (250 mg) once daily on Days 2 through 5. 01/06/18 01/11/18  Joni Reining, PA-C  beclomethasone (QVAR) 80 MCG/ACT inhaler Inhale 2 puffs into the lungs 2 (two) times daily.    [provider]  benzonatate (TESSALON PERLES) 100 MG capsule Take 2 capsules (200 mg total) by mouth 3 (three) times daily as needed. 01/06/18 01/06/19  Joni Reining, PA-C  chlorpheniramine-HYDROcodone (TUSSIONEX PENNKINETIC ER) 10-8 MG/5ML SUER Take 5 mLs by mouth at bedtime as needed for cough. 01/06/18   Joni Reining, PA-C  clonazePAM (KLONOPIN) 0.5 MG tablet Take 1 tablet by mouth 2 (two) times daily. As needed    [provider]  gabapentin (NEURONTIN) 300 MG capsule Take 300 mg by mouth 2 (two) times a week.    [provider]  oxybutynin (DITROPAN-XL) 10 MG 24 hr tablet Take 10 mg by mouth at bedtime.    [provider]  propranolol (INDERAL) 20 MG tablet Take 10 mg by mouth 2 (two) times daily.    [provider]  traZODone (DESYREL) 100 MG tablet Take 150-300 mg by mouth at bedtime.    [provider]    Allergies Penicillins  Family History  Problem Relation Age of Onset  . Alcohol abuse Mother   . Drug abuse Mother   . HIV Mother   . Bipolar disorder Mother   . Cirrhosis Mother   . Cancer Father   . Heart disease Father   . Drug abuse Sister   . Alcohol abuse Sister     Social History Social History   Tobacco Use  . Smoking status: Never Smoker  . Smokeless tobacco: Never Used  Substance Use Topics  .  Alcohol use: No    Alcohol/week: 0.0 oz  . Drug use: No    Review of Systems Constitutional: No fever/chills Eyes: No visual changes. ENT: No sore throat. Cardiovascular: Denies chest pain. Respiratory: Denies shortness of breath. Productive cough. Gastrointestinal: No abdominal pain.  No nausea, no vomiting.  No diarrhea.  No constipation. Genitourinary: Negative for dysuria. Musculoskeletal: Negative for back pain. Skin: Negative for rash. Neurological: Negative for headaches, focal weakness or numbness. Allergic/Immunilogical: Penicillin ____________________________________________   PHYSICAL EXAM:  VITAL  SIGNS: ED Triage Vitals  Enc Vitals Group     BP 01/06/18 1657 138/75     Pulse Rate 01/06/18 1657 100     Resp 01/06/18 1657 20     Temp 01/06/18 1657 99.6 F (37.6 C)     Temp Source 01/06/18 1657 Oral     SpO2 01/06/18 1657 95 %     Weight 01/06/18 1659 240 lb (108.9 kg)     Height 01/06/18 1659 5\' 3"  (1.6 m)     Head Circumference --      Peak Flow --      Pain Score --      Pain Loc --      Pain Edu? --      Excl. in GC? --    Constitutional: Alert and oriented. Well appearing and in no acute distress. Nose: No congestion/rhinnorhea. Mouth/Throat: Mucous membranes are moist.  Oropharynx non-erythematous. Neck: No stridor.  No cervical spine tenderness to palpation. Hematological/Lymphatic/Immunilogical: No cervical lymphadenopathy. Cardiovascular: Normal rate, regular rhythm. Grossly normal heart sounds.  Good peripheral circulation. Respiratory: Normal respiratory effort.  No retractions. Lungs CTAB. Neurologic:  Normal speech and language. No gross focal neurologic deficits are appreciated. No gait instability. Skin:  Skin is warm, dry and intact. No rash noted. Psychiatric: Mood and affect are normal. Speech and behavior are normal.  ____________________________________________   LABS (all labs ordered are listed, but only abnormal results are displayed)  Labs Reviewed - No data to display ____________________________________________  EKG   ____________________________________________  RADIOLOGY  Dg Chest 2 View  Result Date: 01/06/2018 CLINICAL DATA:  Cough. History of flu 2 months ago with unresolved symptoms. EXAM: CHEST  2 VIEW COMPARISON:  12/28/2012 FINDINGS: The heart size and mediastinal contours are within normal limits. Small focus of airspace opacity is seen within the left lung along the expected course of the major fissure. A small focus of pneumonia and/or atelectasis might account for this appearance. Given patient's symptoms, favor small focus of  pneumonia. The visualized skeletal structures are unremarkable. IMPRESSION: Small focus of airspace opacity in the left lung along the expected course of the major fissure suspicious for pneumonia. Electronically Signed   By: Tollie Ethavid  Kwon M.D.   On: 01/06/2018 17:51    ____________________________________________   PROCEDURES  Procedure(s) performed: None  Procedures  Critical Care performed: No  ____________________________________________   INITIAL IMPRESSION / ASSESSMENT AND PLAN / ED COURSE  As part of my medical decision making, I reviewed the following data within the electronic MEDICAL RECORD NUMBER  Patient presents productive cough for 2 months status post diagnosed with flu. Chest x-ray is consistent with focal pneumonia left upper lung. Discussed x-ray finding with patient. Patient given discharge care instructions advised take medication as directed. Patient last saw her PCP is nose no improvement in 5 days. Return to ED if condition worsens.        ____________________________________________   FINAL CLINICAL IMPRESSION(S) / ED DIAGNOSES  Final diagnoses:  Community  acquired pneumonia of left upper lobe of lung [facility])     ED Discharge Orders        Ordered    azithromycin (ZITHROMAX Z-PAK) 250 MG tablet     01/06/18 1758    benzonatate (TESSALON PERLES) 100 MG capsule  3 times daily PRN     01/06/18 1758    chlorpheniramine-HYDROcodone (TUSSIONEX PENNKINETIC ER) 10-8 MG/5ML SUER  At bedtime PRN     01/06/18 1758       Note:  This document was prepared using Dragon voice recognition software and may include unintentional dictation errors.    Joni Reining, PA-C 01/06/18 Mallie Snooks    Merrily Brittle, MD 01/06/18 Jerene Bears

## 2018-01-06 NOTE — ED Notes (Signed)
Patient back from  X-ray 

## 2018-01-31 ENCOUNTER — Emergency Department: Payer: Self-pay

## 2018-01-31 ENCOUNTER — Encounter: Payer: Self-pay | Admitting: Emergency Medicine

## 2018-01-31 ENCOUNTER — Other Ambulatory Visit: Payer: Self-pay

## 2018-01-31 ENCOUNTER — Emergency Department
Admission: EM | Admit: 2018-01-31 | Discharge: 2018-01-31 | Disposition: A | Discharge status: Home / Self Care | Payer: Self-pay | Attending: Emergency Medicine | Admitting: Emergency Medicine

## 2018-01-31 DIAGNOSIS — K5900 Constipation, unspecified: Secondary | ICD-10-CM | POA: Insufficient documentation

## 2018-01-31 DIAGNOSIS — Z79899 Other long term (current) drug therapy: Secondary | ICD-10-CM | POA: Insufficient documentation

## 2018-01-31 DIAGNOSIS — J45909 Unspecified asthma, uncomplicated: Secondary | ICD-10-CM | POA: Insufficient documentation

## 2018-01-31 DIAGNOSIS — R109 Unspecified abdominal pain: Secondary | ICD-10-CM

## 2018-01-31 LAB — COMPREHENSIVE METABOLIC PANEL
ALBUMIN: 3.9 g/dL (ref 3.5–5.0)
ALK PHOS: 52 U/L (ref 38–126)
ALT: 12 U/L — AB (ref 14–54)
AST: 18 U/L (ref 15–41)
Anion gap: 7 (ref 5–15)
BUN: 12 mg/dL (ref 6–20)
CALCIUM: 9.3 mg/dL (ref 8.9–10.3)
CO2: 26 mmol/L (ref 22–32)
CREATININE: 0.87 mg/dL (ref 0.44–1.00)
Chloride: 106 mmol/L (ref 101–111)
GFR calc Af Amer: >60 mL/min (ref >60)
GFR calc non Af Amer: >60 mL/min (ref >60)
GLUCOSE: 115 mg/dL — AB (ref 65–99)
Potassium: 3.9 mmol/L (ref 3.5–5.1)
SODIUM: 139 mmol/L (ref 135–145)
Total Bilirubin: 0.4 mg/dL (ref 0.3–1.2)
Total Protein: 7.6 g/dL (ref 6.5–8.1)

## 2018-01-31 LAB — URINALYSIS, COMPLETE (UACMP) WITH MICROSCOPIC
BILIRUBIN URINE: NEGATIVE
Bacteria, UA: NONE SEEN
Glucose, UA: NEGATIVE mg/dL
Ketones, ur: NEGATIVE mg/dL
Leukocytes, UA: NEGATIVE
Nitrite: NEGATIVE
PROTEIN: NEGATIVE mg/dL
Specific Gravity, Urine: 1.029 (ref 1.005–1.030)
pH: 5 (ref 5.0–8.0)

## 2018-01-31 LAB — CBC
HCT: 37.6 % (ref 35.0–47.0)
Hemoglobin: 12.4 g/dL (ref 12.0–16.0)
MCH: 29.5 pg (ref 26.0–34.0)
MCHC: 33 g/dL (ref 32.0–36.0)
MCV: 89.3 fL (ref 80.0–100.0)
PLATELETS: 368 10*3/uL (ref 150–440)
RBC: 4.21 MIL/uL (ref 3.80–5.20)
RDW: 15 % — ABNORMAL HIGH (ref 11.5–14.5)
WBC: 5.8 10*3/uL (ref 3.6–11.0)

## 2018-01-31 LAB — LIPASE, BLOOD: Lipase: 29 U/L (ref 11–51)

## 2018-01-31 LAB — POCT PREGNANCY, URINE: Preg Test, Ur: NEGATIVE

## 2018-01-31 MED ORDER — OMEPRAZOLE 40 MG PO CPDR: 40.0000 mg | DELAYED_RELEASE_CAPSULE | Freq: Every day | ORAL | 0 refills | Status: DC

## 2018-01-31 MED ORDER — DOCUSATE SODIUM 100 MG PO CAPS: 100.0000 mg | ORAL_CAPSULE | Freq: Every day | ORAL | 0 refills | Status: AC | PRN

## 2018-01-31 MED ORDER — MORPHINE SULFATE (PF) 4 MG/ML IV SOLN
4.0000 mg | Freq: Once | INTRAVENOUS | Status: AC
  Administered 2018-01-31: 4 mg via INTRAVENOUS
  Filled 2018-01-31: qty 1

## 2018-01-31 MED ORDER — ONDANSETRON HCL 4 MG/2ML IJ SOLN
4.0000 mg | Freq: Once | INTRAMUSCULAR | Status: AC
  Administered 2018-01-31: 4 mg via INTRAVENOUS
  Filled 2018-01-31: qty 2

## 2018-01-31 MED ORDER — MAGNESIUM CITRATE PO SOLN: 1.0000 | Freq: Once | ORAL | 0 refills | Status: DC | PRN

## 2018-01-31 MED ORDER — IOPAMIDOL (ISOVUE-300) INJECTION 61%
100.0000 mL | Freq: Once | INTRAVENOUS | Status: AC | PRN
  Administered 2018-01-31: 100 mL via INTRAVENOUS

## 2018-01-31 MED ORDER — SODIUM CHLORIDE 0.9 % IV BOLUS (SEPSIS)
1000.0000 mL | Freq: Once | INTRAVENOUS | Status: AC
  Administered 2018-01-31: 1000 mL via INTRAVENOUS

## 2018-01-31 NOTE — ED Triage Notes (Signed)
Patient to ER for c/o left lower abd pain. Patient states pain began on Thursday, worsens with eating. Patient denies any radiating pain to groin or flank. Denies fevers.

## 2018-01-31 NOTE — ED Provider Notes (Signed)
[facility] Emergency Department Provider Note  ____________________________________________   First MD Initiated Contact with Patient 01/31/18 1226     (approximate)  I have reviewed the triage vital signs and the nursing notes.   HISTORY  Chief Complaint Abdominal Pain   HPI Sierra Wiley is a 47 y.o. female with a history of bipolar disorder as well as opiate use was presented to the emergency department left-sided abdominal pain that is been slowly increasing since this past Tuesday.  Says that it is associated with nausea as well as constipation.  She says that she only is able to move "palate" stools.  She says that she uses enemas as well as stool softeners without relief.  Says that this is been a chronic issue and has had left-sided abdominal pain similar to this in the past.  She says that her last bowel movement was this morning when she had several pellets.  Says that the pain is a 10 out of 10 and sharp and nonradiating.   Past Medical History:  Diagnosis Date  . Asthma   . Bipolar 1 disorder (HCC)   . Chronic back pain   . Depression   . Opiate use 02/21/2016  . PTSD (post-traumatic stress disorder)   . Seizures [facility])     Patient Active Problem List   Diagnosis Date Noted  . 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 Bipolar 1 disorder, depressed (HCC) 09/05/2016  . Chronic pain 02/21/2016  . Asthma 11/06/2015  . Post traumatic stress disorder 07/01/2015    Past Surgical History:  Procedure Laterality Date  . CHOLECYSTECTOMY    . ELBOW SURGERY Left 2000  . TUBAL LIGATION      Prior to Admission medications   Medication Sig Start Date End Date Taking? Authorizing Provider  albuterol (PROVENTIL HFA;VENTOLIN HFA) 108 (90 Base) MCG/ACT inhaler Inhale 2 puffs into the lungs every 6 (six) hours as needed for wheezing or shortness of breath.    [provider]  beclomethasone (QVAR) 80 MCG/ACT inhaler Inhale 2 puffs  into the lungs 2 (two) times daily.    [provider]  benzonatate (TESSALON PERLES) 100 MG capsule Take 2 capsules (200 mg total) by mouth 3 (three) times daily as needed. 01/06/18 01/06/19  Joni Reining, PA-C  chlorpheniramine-HYDROcodone (TUSSIONEX PENNKINETIC ER) 10-8 MG/5ML SUER Take 5 mLs by mouth at bedtime as needed for cough. 01/06/18   Joni Reining, PA-C  clonazePAM (KLONOPIN) 0.5 MG tablet Take 1 tablet by mouth 2 (two) times daily. As needed    [provider]  gabapentin (NEURONTIN) 300 MG capsule Take 300 mg by mouth 2 (two) times a week.    [provider]  oxybutynin (DITROPAN-XL) 10 MG 24 hr tablet Take 10 mg by mouth at bedtime.    [provider]  propranolol (INDERAL) 20 MG tablet Take 10 mg by mouth 2 (two) times daily.    [provider]  traZODone (DESYREL) 100 MG tablet Take 150-300 mg by mouth at bedtime.    [provider]    Allergies Penicillins  Family History  Problem Relation Age of Onset  . Alcohol abuse Mother   . Drug abuse Mother   . HIV Mother   . Bipolar disorder Mother   . Cirrhosis Mother   . Cancer Father   . Heart disease Father   . Drug abuse Sister   . Alcohol abuse Sister     Social History Social History  Tobacco Use  . Smoking status: Never Smoker  . Smokeless tobacco: Never Used  Substance Use Topics  . Alcohol use: No    Alcohol/week: 0.0 oz  . Drug use: No    Review of Systems  Constitutional: No fever/chills Eyes: No visual changes. ENT: No sore throat. Cardiovascular: Denies chest pain. Respiratory: Denies shortness of breath. Gastrointestinal: no vomiting. No constipation. Genitourinary: Negative for dysuria. Musculoskeletal: Negative for back pain. Skin: Negative for rash. Neurological: Negative for headaches, focal weakness or numbness.   ____________________________________________   PHYSICAL EXAM:  VITAL SIGNS: ED Triage Vitals  Enc Vitals Group      BP 01/31/18 1014 (!) 156/66     Pulse Rate 01/31/18 1014 72     Resp 01/31/18 1014 20     Temp 01/31/18 1014 98.4 F (36.9 C)     Temp Source 01/31/18 1014 Oral     SpO2 01/31/18 1014 100 %     Weight 01/31/18 1015 240 lb (108.9 kg)     Height 01/31/18 1015 5\' 3"  (1.6 m)     Head Circumference --      Peak Flow --      Pain Score 01/31/18 1015 10     Pain Loc --      Pain Edu? --      Excl. in GC? --     Constitutional: Alert and oriented. Well appearing and in no acute distress. Eyes: Conjunctivae are normal.  Head: Atraumatic. Nose: No congestion/rhinnorhea. Mouth/Throat: Mucous membranes are moist.  Neck: No stridor.   Cardiovascular: Normal rate, regular rhythm. Grossly normal heart sounds.   Respiratory: Normal respiratory effort.  No retractions. Lungs CTAB. Gastrointestinal: Soft with moderate left upper quadrant tenderness and mild left lower quadrant tenderness to palpation without any rebound or guarding.  No distention.  Digital rectal exam without fecal impaction.  Small amount of brown stool on the glove. Musculoskeletal: No lower extremity tenderness nor edema.  No joint effusions. Neurologic:  Normal speech and language. No gross focal neurologic deficits are appreciated. Skin:  Skin is warm, dry and intact. No rash noted. Psychiatric: Mood and affect are normal. Speech and behavior are normal.  ____________________________________________   LABS (all labs ordered are listed, but only abnormal results are displayed)  Labs Reviewed  COMPREHENSIVE METABOLIC PANEL - Abnormal; Notable for the following components:      Result Value   Glucose, Bld 115 (*)    ALT 12 (*)    All other components within normal limits  CBC - Abnormal; Notable for the following components:   RDW 15.0 (*)    All other components within normal limits  URINALYSIS, COMPLETE (UACMP) WITH MICROSCOPIC - Abnormal; Notable for the following components:   Color, Urine YELLOW (*)     APPearance CLEAR (*)    Hgb urine dipstick SMALL (*)    Squamous Epithelial / LPF 0-5 (*)    All other components within normal limits  LIPASE, BLOOD  POC URINE PREG, ED  POCT PREGNANCY, URINE   ____________________________________________  EKG   ____________________________________________  RADIOLOGY  CT of the abdomen without acute pathology. ____________________________________________   PROCEDURES  Procedure(s) performed:   Procedures  Critical Care performed:   ____________________________________________   INITIAL IMPRESSION / ASSESSMENT AND PLAN / ED COURSE  Pertinent labs & imaging results that were available during my care of the patient were reviewed by me and considered in my medical decision making (see chart for details).  Differential diagnosis includes, but is  not limited to, biliary disease (biliary colic, acute cholecystitis, cholangitis, choledocholithiasis, etc), intrathoracic causes for epigastric abdominal pain including ACS, gastritis, duodenitis, pancreatitis, small bowel or large bowel obstruction, abdominal aortic aneurysm, hernia, and gastritis. Differential diagnosis includes, but is not limited to, acute appendicitis, renal colic, testicular torsion, urinary tract infection/pyelonephritis, prostatitis,  epididymitis, diverticulitis, small bowel obstruction or ileus, colitis, abdominal aortic aneurysm, gastroenteritis, hernia, etc. As part of my medical decision making, I reviewed the following data within the electronic MEDICAL RECORD NUMBER Notes from prior ED visits  ----------------------------------------- 4:11 PM on 01/31/2018 -----------------------------------------  Patient pain-free after morphine.  Re-examined the abdomen and she is soft and nontender throughout.  Possible recurrent pain from constipation.  Patient will be discharged with Colace as well as magnesium citrate and omeprazole.  We discussed follow-up with primary care.  She is  understanding of the plan willing to comply but will be discharged at this time.      ____________________________________________   FINAL CLINICAL IMPRESSION(S) / ED DIAGNOSES  Left-sided abdominal pain.  Constipation.    NEW MEDICATIONS STARTED DURING THIS VISIT:  New Prescriptions   No medications on file     Note:  This document was prepared using Dragon voice recognition software and may include unintentional dictation errors.     Myrna BlazerSchaevitz, Verl Kitson Matthew, MD 01/31/18 [phone]

## 2018-01-31 NOTE — ED Notes (Signed)
Patient taken to imaging. 

## 2018-02-04 ENCOUNTER — Emergency Department
Admission: EM | Admit: 2018-02-04 | Discharge: 2018-02-04 | Disposition: A | Discharge status: Home / Self Care | Payer: Self-pay | Attending: Emergency Medicine | Admitting: Emergency Medicine

## 2018-02-04 ENCOUNTER — Encounter: Payer: Self-pay | Admitting: Emergency Medicine

## 2018-02-04 ENCOUNTER — Emergency Department: Payer: Self-pay

## 2018-02-04 DIAGNOSIS — J40 Bronchitis, not specified as acute or chronic: Secondary | ICD-10-CM | POA: Insufficient documentation

## 2018-02-04 DIAGNOSIS — R42 Dizziness and giddiness: Secondary | ICD-10-CM | POA: Insufficient documentation

## 2018-02-04 DIAGNOSIS — Z79899 Other long term (current) drug therapy: Secondary | ICD-10-CM | POA: Insufficient documentation

## 2018-02-04 LAB — BASIC METABOLIC PANEL
ANION GAP: 10 (ref 5–15)
BUN: 8 mg/dL (ref 6–20)
CO2: 24 mmol/L (ref 22–32)
CREATININE: 0.72 mg/dL (ref 0.44–1.00)
Calcium: 9.3 mg/dL (ref 8.9–10.3)
Chloride: 104 mmol/L (ref 101–111)
GFR calc non Af Amer: >60 mL/min (ref >60)
Glucose, Bld: 91 mg/dL (ref 65–99)
Potassium: 3.6 mmol/L (ref 3.5–5.1)
Sodium: 138 mmol/L (ref 135–145)

## 2018-02-04 LAB — CBC
HCT: 35.4 % (ref 35.0–47.0)
Hemoglobin: 11.7 g/dL — ABNORMAL LOW (ref 12.0–16.0)
MCH: 29.3 pg (ref 26.0–34.0)
MCHC: 32.9 g/dL (ref 32.0–36.0)
MCV: 89.2 fL (ref 80.0–100.0)
PLATELETS: 337 10*3/uL (ref 150–440)
RBC: 3.97 MIL/uL (ref 3.80–5.20)
RDW: 14.9 % — AB (ref 11.5–14.5)
WBC: 4.1 10*3/uL (ref 3.6–11.0)

## 2018-02-04 LAB — TROPONIN I: Troponin I: <0.03 ng/mL (ref <0.03)

## 2018-02-04 MED ORDER — BENZONATATE 100 MG PO CAPS: 100.0000 mg | ORAL_CAPSULE | Freq: Four times a day (QID) | ORAL | 0 refills | Status: DC | PRN

## 2018-02-04 NOTE — ED Provider Notes (Signed)
[facility] Emergency Department Provider Note  ____________________________________________   First MD Initiated Contact with Patient 02/04/18 1716     (approximate)  I have reviewed the triage vital signs and the nursing notes.   HISTORY  Chief Complaint Dizziness and Shortness of Breath   HPI Sierra Wiley is a 47 y.o. female who self presents to the emergency department with 2 days of cough, fever to 100 degrees at home, and shortness of breath.  Her symptoms had gradual onset.  They seem to be somewhat worse with exertion.  Her cough is nonproductive.  She was at work today and she began coughing and felt short of breath so she quit and decided to come to the emergency department.  She denies sore throat.  She is able to eat and drink.  She has tried over-the-counter cough medicine with minimal relief.  She is concerned that she might have pneumonia because she had pneumonia 3 months ago.  Past Medical History:  Diagnosis Date  . Asthma   . Bipolar 1 disorder (HCC)   . Chronic back pain   . Depression   . Opiate use 02/21/2016  . PTSD (post-traumatic stress disorder)   . Seizures [facility](HCC)     Patient Active Problem List   Diagnosis Date Noted  . 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 Bipolar 1 disorder, depressed (HCC) 09/05/2016  . Chronic pain 02/21/2016  . Asthma 11/06/2015  . Post traumatic stress disorder 07/01/2015    Past Surgical History:  Procedure Laterality Date  . CHOLECYSTECTOMY    . ELBOW SURGERY Left 2000  . TUBAL LIGATION      Prior to Admission medications   Medication Sig Start Date End Date Taking? Authorizing Provider  albuterol (PROVENTIL HFA;VENTOLIN HFA) 108 (90 Base) MCG/ACT inhaler Inhale 2 puffs into the lungs every 6 (six) hours as needed for wheezing or shortness of breath.    [provider]  beclomethasone (QVAR) 80 MCG/ACT inhaler Inhale 2 puffs into the lungs 2 (two) times daily.     [provider]  benzonatate (TESSALON PERLES) 100 MG capsule Take 2 capsules (200 mg total) by mouth 3 (three) times daily as needed. 01/06/18 01/06/19  Joni ReiningSmith, Ronald K, PA-C  benzonatate (TESSALON PERLES) 100 MG capsule Take 1 capsule (100 mg total) by mouth every 6 (six) hours as needed for cough. 02/04/18 02/04/19  Merrily Brittleifenbark, Omar Gayden, MD  chlorpheniramine-HYDROcodone (TUSSIONEX PENNKINETIC ER) 10-8 MG/5ML SUER Take 5 mLs by mouth at bedtime as needed for cough. 01/06/18   Joni ReiningSmith, Ronald K, PA-C  clonazePAM (KLONOPIN) 0.5 MG tablet Take 1 tablet by mouth 2 (two) times daily. As needed    [provider]  docusate sodium (COLACE) 100 MG capsule Take 1 capsule (100 mg total) by mouth daily as needed. 01/31/18 01/31/19  Myrna BlazerSchaevitz, David Matthew, MD  gabapentin (NEURONTIN) 300 MG capsule Take 300 mg by mouth 2 (two) times a week.    [provider]  magnesium citrate SOLN Take 296 mLs (1 Bottle total) by mouth once as needed for up to 1 dose for severe constipation. 01/31/18   Myrna BlazerSchaevitz, David Matthew, MD  omeprazole (PRILOSEC) 40 MG capsule Take 1 capsule (40 mg total) by mouth daily. 01/31/18 01/31/19  Myrna BlazerSchaevitz, David Matthew, MD  oxybutynin (DITROPAN-XL) 10 MG 24 hr tablet Take 10 mg by mouth at bedtime.    [provider]  propranolol (INDERAL) 20 MG tablet Take 10 mg by mouth 2 (two) times daily.  [provider]  traZODone (DESYREL) 100 MG tablet Take 150-300 mg by mouth at bedtime.    [provider]    Allergies Penicillins  Family History  Problem Relation Age of Onset  . Alcohol abuse Mother   . Drug abuse Mother   . HIV Mother   . Bipolar disorder Mother   . Cirrhosis Mother   . Cancer Father   . Heart disease Father   . Drug abuse Sister   . Alcohol abuse Sister     Social History Social History   Tobacco Use  . Smoking status: Never Smoker  . Smokeless tobacco: Never Used  Substance Use Topics  . Alcohol use: No    Alcohol/week: 0.0  oz  . Drug use: No    Review of Systems Constitutional: Positive for fever Eyes: No visual changes. ENT: No sore throat. Cardiovascular: Positive for chest pain. Respiratory: Positive for shortness of breath. Gastrointestinal: No abdominal pain.  No nausea, no vomiting.  No diarrhea.  No constipation. Genitourinary: Negative for dysuria. Musculoskeletal: Negative for back pain. Skin: Negative for rash. Neurological: Negative for headaches, focal weakness or numbness.   ____________________________________________   PHYSICAL EXAM:  VITAL SIGNS: ED Triage Vitals [02/04/18 1454]  Enc Vitals Group     BP      Pulse      Resp      Temp      Temp src      SpO2      Weight 240 lb (108.9 kg)     Height 5\' 3"  (1.6 m)     Head Circumference      Peak Flow      Pain Score 7     Pain Loc      Pain Edu?      Excl. in GC?     Constitutional: Alert and oriented x4 dry cough while we talk no distress speaks in full clear sentences Eyes: PERRL EOMI. Head: Atraumatic. Nose: Positive for congestion Mouth/Throat: No trismus uvula midline no pharyngeal erythema or exudate Neck: No stridor.   Cardiovascular: Normal rate, regular rhythm. Grossly normal heart sounds.  Good peripheral circulation. Respiratory: Lightly increased respiratory effort with mild wheeze throughout lungs are equal Gastrointestinal: Obese soft nontender Musculoskeletal: No lower extremity edema   Neurologic:  Normal speech and language. No gross focal neurologic deficits are appreciated. Skin:  Skin is warm, dry and intact. No rash noted. Psychiatric: Mood and affect are normal. Speech and behavior are normal.    ____________________________________________   DIFFERENTIAL includes but not limited to  Pneumonia, pneumothorax, bronchitis, influenza, influenza-like illness ____________________________________________   LABS (all labs ordered are listed, but only abnormal results are displayed)  Labs  Reviewed  CBC - Abnormal; Notable for the following components:      Result Value   Hemoglobin 11.7 (*)    RDW 14.9 (*)    All other components within normal limits  BASIC METABOLIC PANEL  TROPONIN I  POC URINE PREG, ED    Blood work reviewed by me with no acute disease no signs of acute ischemia __________________________________________  EKG  ED ECG REPORT I, Merrily Brittle, the attending physician, personally viewed and interpreted this ECG.  Date: 02/04/2018 EKG Time:  Rate: 92 Rhythm: normal sinus rhythm QRS Axis: normal Intervals: normal ST/T Wave abnormalities: normal Narrative Interpretation: no evidence of acute ischemia  ____________________________________________  RADIOLOGY  Chest x-ray reviewed by me with no acute disease ____________________________________________   PROCEDURES  Procedure(s) performed: no  Procedures  Critical Care performed: no  Observation: no ____________________________________________   INITIAL IMPRESSION / ASSESSMENT AND PLAN / ED COURSE  Pertinent labs & imaging results that were available during my care of the patient were reviewed by me and considered in my medical decision making (see chart for details).  Patient is currently afebrile.  She has mild wheeze.  Chest x-ray with no evidence of infiltrate.  Her symptoms are most consistent with viral bronchitis.  Her primary concern is her cough so I will prescribe her Jerilynn Som as an outpatient.  I will refer her back to her primary care.  Strict return precautions have been given and the patient verbalizes understanding and agree with the plan.      ____________________________________________   FINAL CLINICAL IMPRESSION(S) / ED DIAGNOSES  Final diagnoses:  Bronchitis      NEW MEDICATIONS STARTED DURING THIS VISIT:  New Prescriptions   BENZONATATE (TESSALON PERLES) 100 MG CAPSULE    Take 1 capsule (100 mg total) by mouth every 6 (six) hours as needed  for cough.     Note:  This document was prepared using Dragon voice recognition software and may include unintentional dictation errors.     Merrily Brittle, MD 02/04/18 [phone]

## 2018-02-04 NOTE — ED Notes (Signed)
NAD noted at time of D/C. Pt denies questions or concerns. Pt ambulatory to the lobby at this time.  

## 2018-02-04 NOTE — Discharge Instructions (Signed)
Fortunately today your blood work and your chest x-ray were reassuring and you do not need antibiotics.  Please make sure you remain well-hydrated and follow-up with your primary care physician for any concerns.  It is normal to be sick for 3-4 more days with this illness.  It was a pleasure to take care of you today, and thank you for coming to our emergency department.  If you have any questions or concerns before leaving please ask the nurse to grab me and I'm more than happy to go through your aftercare instructions again.  If you were prescribed any opioid pain medication today such as Norco, Vicodin, Percocet, morphine, hydrocodone, or oxycodone please make sure you do not drive when you are taking this medication as it can alter your ability to drive safely.  If you have any concerns once you are home that you are not improving or are in fact getting worse before you can make it to your follow-up appointment, please do not hesitate to call 911 and come back for further evaluation.  Merrily BrittleNeil Laurelai Lepp, MD  Results for orders placed or performed during the hospital encounter of 02/04/18  Basic metabolic panel  Result Value Ref Range   Sodium 138 135 - 145 mmol/L   Potassium 3.6 3.5 - 5.1 mmol/L   Chloride 104 101 - 111 mmol/L   CO2 24 22 - 32 mmol/L   Glucose, Bld 91 65 - 99 mg/dL   BUN 8 6 - 20 mg/dL   Creatinine, Ser 1.610.72 0.44 - 1.00 mg/dL   Calcium 9.3 8.9 - 09.610.3 mg/dL   GFR calc non Af Amer >60 >60 mL/min   GFR calc Af Amer >60 >60 mL/min   Anion gap 10 5 - 15  CBC  Result Value Ref Range   WBC 4.1 3.6 - 11.0 K/uL   RBC 3.97 3.80 - 5.20 MIL/uL   Hemoglobin 11.7 (L) 12.0 - 16.0 g/dL   HCT 04.535.4 40.935.0 - 81.147.0 %   MCV 89.2 80.0 - 100.0 fL   MCH 29.3 26.0 - 34.0 pg   MCHC 32.9 32.0 - 36.0 g/dL   RDW 91.414.9 (H) 78.211.5 - 95.614.5 %   Platelets 337 150 - 440 K/uL  Troponin I  Result Value Ref Range   Troponin I <0.03 <0.03 ng/mL   Dg Chest 2 View  Result Date: 02/04/2018 CLINICAL DATA:   Generalized chest pain for 5 days, shortness of Breath EXAM: CHEST  2 VIEW COMPARISON:  01/06/2018 FINDINGS: Previously seen airspace opacity in the left mid lung has resolved. No confluent opacities currently. No effusions. Heart is normal size. No acute bony abnormality. IMPRESSION: No active cardiopulmonary disease. Electronically Signed   By: Charlett NoseKevin  Dover M.D.   On: 02/04/2018 15:20   Dg Chest 2 View  Result Date: 01/06/2018 CLINICAL DATA:  Cough. History of flu 2 months ago with unresolved symptoms. EXAM: CHEST  2 VIEW COMPARISON:  12/28/2012 FINDINGS: The heart size and mediastinal contours are within normal limits. Small focus of airspace opacity is seen within the left lung along the expected course of the major fissure. A small focus of pneumonia and/or atelectasis might account for this appearance. Given patient's symptoms, favor small focus of pneumonia. The visualized skeletal structures are unremarkable. IMPRESSION: Small focus of airspace opacity in the left lung along the expected course of the major fissure suspicious for pneumonia. Electronically Signed   By: Tollie Ethavid  Kwon M.D.   On: 01/06/2018 17:51   Ct Abdomen Pelvis W  Contrast  Result Date: 01/31/2018 CLINICAL DATA:  Left-sided abdominal pain x4 days EXAM: CT ABDOMEN AND PELVIS WITH CONTRAST TECHNIQUE: Multidetector CT imaging of the abdomen and pelvis was performed using the standard protocol following bolus administration of intravenous contrast. CONTRAST:  ISOVUE-300 IOPAMIDOL (ISOVUE-300) INJECTION 61% COMPARISON:  10/12/2013 FINDINGS: Lower chest: No acute abnormality. Hepatobiliary: No focal liver abnormality is seen. Status post cholecystectomy. No biliary dilatation. Pancreas: Unremarkable. No pancreatic ductal dilatation or surrounding inflammatory changes. Spleen: Normal in size without focal abnormality. Adrenals/Urinary Tract: Adrenal glands are unremarkable. Kidneys are normal, without renal calculi, focal lesion, or  hydronephrosis. Bladder is unremarkable. Stomach/Bowel: Stomach and small bowel are nondistended. Normal appendix. Colon is nondilated, unremarkable. Vascular/Lymphatic: Bilateral pelvic phleboliths. No other significant vascular findings. No abdominal or pelvic adenopathy. Reproductive: Uterus and bilateral adnexa are unremarkable. Other: No ascites.  No free air. Musculoskeletal: Early marginal spurring in both hips. No fracture or other worrisome bone lesion. IMPRESSION: 1. No acute findings. Electronically Signed   By: Corlis Leak M.D.   On: 01/31/2018 14:36     Unfortunately prescriptions medications can be very expensive.  Please consider [facility] as they have a number of medications that are $4 for a 30 day supply.  [email or web address]  Another great option is www.goodrx.com which can help you find the most affordable prices around you.

## 2018-02-04 NOTE — ED Triage Notes (Signed)
Pt comes into the ED via POV c/o shortness of breath and dizziness that has been ongoing for weeks.  Patient was diagnosed with pneumonia 3 weeks ago and she states the symptoms have not resolved.  Patient explains she has cold chills, sweats, cough, and dizziness.  Patient has even and unlabored respirations at this time and in NAD.

## 2018-05-26 ENCOUNTER — Other Ambulatory Visit: Payer: Self-pay

## 2018-05-26 ENCOUNTER — Emergency Department (HOSPITAL_COMMUNITY): Payer: Self-pay

## 2018-05-26 ENCOUNTER — Emergency Department (HOSPITAL_COMMUNITY)
Admission: EM | Admit: 2018-05-26 | Discharge: 2018-05-26 | Disposition: A | Discharge status: Home / Self Care | Payer: Self-pay | Attending: Emergency Medicine | Admitting: Emergency Medicine

## 2018-05-26 ENCOUNTER — Encounter (HOSPITAL_COMMUNITY): Payer: Self-pay | Admitting: *Deleted

## 2018-05-26 DIAGNOSIS — Z79899 Other long term (current) drug therapy: Secondary | ICD-10-CM | POA: Insufficient documentation

## 2018-05-26 DIAGNOSIS — R202 Paresthesia of skin: Secondary | ICD-10-CM

## 2018-05-26 DIAGNOSIS — R200 Anesthesia of skin: Secondary | ICD-10-CM | POA: Insufficient documentation

## 2018-05-26 DIAGNOSIS — J45909 Unspecified asthma, uncomplicated: Secondary | ICD-10-CM | POA: Insufficient documentation

## 2018-05-26 LAB — CBC WITH DIFFERENTIAL/PLATELET
BASOS ABS: 0 10*3/uL (ref 0.0–0.1)
Basophils Relative: 1 %
EOS ABS: 0.1 10*3/uL (ref 0.0–0.7)
EOS PCT: 2 %
HCT: 34.6 % — ABNORMAL LOW (ref 36.0–46.0)
Hemoglobin: 11.1 g/dL — ABNORMAL LOW (ref 12.0–15.0)
LYMPHS PCT: 43 %
Lymphs Abs: 2.7 10*3/uL (ref 0.7–4.0)
MCH: 28.5 pg (ref 26.0–34.0)
MCHC: 32.1 g/dL (ref 30.0–36.0)
MCV: 88.9 fL (ref 78.0–100.0)
Monocytes Absolute: 0.5 10*3/uL (ref 0.1–1.0)
Monocytes Relative: 7 %
NEUTROS PCT: 47 %
Neutro Abs: 3 10*3/uL (ref 1.7–7.7)
PLATELETS: 381 10*3/uL (ref 150–400)
RBC: 3.89 MIL/uL (ref 3.87–5.11)
RDW: 14.7 % (ref 11.5–15.5)
WBC: 6.3 10*3/uL (ref 4.0–10.5)

## 2018-05-26 LAB — COMPREHENSIVE METABOLIC PANEL
ALT: 12 U/L — ABNORMAL LOW (ref 14–54)
AST: 14 U/L — AB (ref 15–41)
Albumin: 3.7 g/dL (ref 3.5–5.0)
Alkaline Phosphatase: 58 U/L (ref 38–126)
Anion gap: 7 (ref 5–15)
BUN: 8 mg/dL (ref 6–20)
CO2: 27 mmol/L (ref 22–32)
Calcium: 9.1 mg/dL (ref 8.9–10.3)
Chloride: 102 mmol/L (ref 101–111)
Creatinine, Ser: 0.78 mg/dL (ref 0.44–1.00)
Glucose, Bld: 119 mg/dL — ABNORMAL HIGH (ref 65–99)
POTASSIUM: 3.6 mmol/L (ref 3.5–5.1)
Sodium: 136 mmol/L (ref 135–145)
TOTAL PROTEIN: 7.3 g/dL (ref 6.5–8.1)
Total Bilirubin: 0.3 mg/dL (ref 0.3–1.2)

## 2018-05-26 LAB — RAPID URINE DRUG SCREEN, HOSP PERFORMED
AMPHETAMINES: NOT DETECTED
BENZODIAZEPINES: NOT DETECTED
Barbiturates: NOT DETECTED
Cocaine: NOT DETECTED
OPIATES: NOT DETECTED
Tetrahydrocannabinol: NOT DETECTED

## 2018-05-26 LAB — MAGNESIUM: Magnesium: 1.7 mg/dL (ref 1.7–2.4)

## 2018-05-26 LAB — VITAMIN B12: Vitamin B-12: 207 pg/mL (ref 180–914)

## 2018-05-26 NOTE — Discharge Instructions (Addendum)
There was one blood test that did not come back tonight, your doctor can check that test result in a couple of days.  Please keep your appointment with your therapist this week, talk to her about your increased stress because of the anniversary of your mother's death.  Return to the emergency room if you feel worse.

## 2018-05-26 NOTE — ED Triage Notes (Signed)
Pt c/o numbness and tingling to right side x 4 days; pt states she feels like she cannot get her words out and feels like her speech is slurred; pt states she feels dizzy and has been having trouble sleeping

## 2018-05-26 NOTE — ED Provider Notes (Signed)
[facility] EMERGENCY DEPARTMENT Provider Note   CSN: [id number] Arrival date & time: 05/26/18  0016  Time seen 02:35 AM   History   Chief Complaint Chief Complaint  Patient presents with  . Numbness    HPI Sierra Wiley is a 47 y.o. female.  HPI patient states 4 days ago she started having tingling and both her hands in both her feet and in her left lower leg.  She states she feels dizzy and lightheaded that is worse when she lays down.  Sometimes her spinning and 2 months ago she felt like she was losing her balance.  I asked her if it happens daily and she states "kind".  The last time she felt dizzy was about 3 PM and it lasted about 45 minutes.  Patient was recently started on new medication, Abilify on May 17, trazodone on May 15, and Celexa on May 15 prescribed by her psychiatrist Dr. Scherrie Bateman.  She states he started it on her for her depression and she feels like it is helping.  She states her left hand has been shaking for 4 days.  She states she has a headache and she points her finger to her left temple and states her pain is only in that one little spot.  It started today.  She denies any facial numbness.  She states she is "collecting saliva in my mouth which makes my speech slurred".  She denies any difficulty walking.  She also states she has twitching to the left side of her mouth.  Patient has a history of PTSD and has a psychiatrist and therapist that she sees weekly.  Her next appointment with the therapist is on the 29th.  She states it is almost a year since her mother died and that has put her under some extra stress recently.  PCP Sandrea Hughs, NP Psychiatry Dr Scherrie Bateman Therapist at "[facility]"  Past Medical History:  Diagnosis Date  . Asthma   . Bipolar 1 disorder (HCC)   . Chronic back pain   . Depression   . Opiate use 02/21/2016  . PTSD (post-traumatic stress disorder)   . Seizures [facility])     Patient Active Problem List   Diagnosis Date Noted  .  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 Bipolar 1 disorder, depressed (HCC) 09/05/2016  . Chronic pain 02/21/2016  . Asthma 11/06/2015  . Post traumatic stress disorder 07/01/2015    Past Surgical History:  Procedure Laterality Date  . CHOLECYSTECTOMY    . ELBOW SURGERY Left 2000  . TUBAL LIGATION       OB History    Gravida  5   Para      Term      Preterm      AB      Living        SAB      TAB      Ectopic      Multiple      Live Births               Home Medications    Prior to Admission medications   Medication Sig Start Date End Date Taking? Authorizing Provider  albuterol (PROVENTIL HFA;VENTOLIN HFA) 108 (90 Base) MCG/ACT inhaler Inhale 2 puffs into the lungs every 6 (six) hours as needed for wheezing or shortness of breath.    [provider]  beclomethasone (QVAR) 80 MCG/ACT inhaler Inhale 2 puffs into the lungs 2 (two) times daily.  [provider]  benzonatate (TESSALON PERLES) 100 MG capsule Take 2 capsules (200 mg total) by mouth 3 (three) times daily as needed. 01/06/18 01/06/19  Joni Reining, PA-C  benzonatate (TESSALON PERLES) 100 MG capsule Take 1 capsule (100 mg total) by mouth every 6 (six) hours as needed for cough. 02/04/18 02/04/19  Merrily Brittle, MD  chlorpheniramine-HYDROcodone (TUSSIONEX PENNKINETIC ER) 10-8 MG/5ML SUER Take 5 mLs by mouth at bedtime as needed for cough. 01/06/18   Joni Reining, PA-C  clonazePAM (KLONOPIN) 0.5 MG tablet Take 1 tablet by mouth 2 (two) times daily. As needed    [provider]  docusate sodium (COLACE) 100 MG capsule Take 1 capsule (100 mg total) by mouth daily as needed. 01/31/18 01/31/19  Myrna Blazer, MD  gabapentin (NEURONTIN) 300 MG capsule Take 300 mg by mouth 2 (two) times a week.    [provider]  magnesium citrate SOLN Take 296 mLs (1 Bottle total) by mouth once as needed for up to 1 dose for severe constipation. 01/31/18   Myrna Blazer, MD  omeprazole (PRILOSEC) 40 MG capsule Take 1 capsule (40 mg total) by mouth daily. 01/31/18 01/31/19  Myrna Blazer, MD  oxybutynin (DITROPAN-XL) 10 MG 24 hr tablet Take 10 mg by mouth at bedtime.    [provider]  propranolol (INDERAL) 20 MG tablet Take 10 mg by mouth 2 (two) times daily.    [provider]  traZODone (DESYREL) 100 MG tablet Take 150-300 mg by mouth at bedtime.    [provider]    Family History Family History  Problem Relation Age of Onset  . Alcohol abuse Mother   . Drug abuse Mother   . HIV Mother   . Bipolar disorder Mother   . Cirrhosis Mother   . Cancer Father   . Heart disease Father   . Drug abuse Sister   . Alcohol abuse Sister     Social History Social History   Tobacco Use  . Smoking status: Never Smoker  . Smokeless tobacco: Never Used  Substance Use Topics  . Alcohol use: No    Alcohol/week: 0.0 oz  . Drug use: No  Patient is unemployed Patient is separated from her husband for 4 years.   Allergies   Penicillins   Review of Systems Review of Systems  All other systems reviewed and are negative.    Physical Exam Updated Vital Signs BP (!) 151/92 (BP Location: Left Arm)   Pulse 88   Temp 98.8 F (37.1 C) (Oral)   Resp 18   Ht  (1.6 m)   Wt 106.6 kg (235 lb)   LMP 05/09/2018   SpO2 96%   BMI 41.63 kg/m   Vital signs normal    Physical Exam  Constitutional: She is oriented to person, place, and time. She appears well-developed and well-nourished.  Non-toxic appearance. She does not appear ill. No distress.  HENT:  Head: Normocephalic and atraumatic.  Right Ear: External ear normal.  Left Ear: External ear normal.  Nose: Nose normal. No mucosal edema or rhinorrhea.  Mouth/Throat: Oropharynx is clear and moist and mucous membranes are normal. No dental abscesses or uvula swelling.  Eyes: Pupils are equal, round, and reactive to light. Conjunctivae and EOM are normal.    Neck: Normal range of motion and full passive range of motion without pain. Neck supple.  Cardiovascular: Normal rate, regular rhythm and normal heart sounds. Exam reveals no gallop and no  friction rub.  No murmur heard. Pulmonary/Chest: Effort normal and breath sounds normal. No respiratory distress. She has no wheezes. She has no rhonchi. She has no rales. She exhibits no tenderness and no crepitus.  Abdominal: Soft. Normal appearance and bowel sounds are normal. She exhibits no distension. There is no tenderness. There is no rebound and no guarding.  Musculoskeletal: Normal range of motion. She exhibits no edema or tenderness.  Moves all extremities well.   Neurological: She is alert and oriented to person, place, and time. She has normal strength. No cranial nerve deficit.  Grips are equal, there is no focal motor deficit, patient ambulated in her room without difficulty.  Skin: Skin is warm, dry and intact. No rash noted. No erythema. No pallor.  Psychiatric: She has a normal mood and affect. Her speech is normal and behavior is normal. Her mood appears not anxious.  Nursing note and vitals reviewed.    ED Treatments / Results  Labs (all labs ordered are listed, but only abnormal results are displayed) Results for orders placed or performed during the hospital encounter of 05/26/18  Comprehensive metabolic panel  Result Value Ref Range   Sodium 136 135 - 145 mmol/L   Potassium 3.6 3.5 - 5.1 mmol/L   Chloride 102 101 - 111 mmol/L   CO2 27 22 - 32 mmol/L   Glucose, Bld 119 (H) 65 - 99 mg/dL   BUN 8 6 - 20 mg/dL   Creatinine, Ser 4.40 0.44 - 1.00 mg/dL   Calcium 9.1 8.9 - 34.7 mg/dL   Total Protein 7.3 6.5 - 8.1 g/dL   Albumin 3.7 3.5 - 5.0 g/dL   AST 14 (L) 15 - 41 U/L   ALT 12 (L) 14 - 54 U/L   Alkaline Phosphatase 58 38 - 126 U/L   Total Bilirubin 0.3 0.3 - 1.2 mg/dL   GFR calc non Af Amer >60 >60 mL/min   GFR calc Af Amer >60 >60 mL/min   Anion gap 7 5 - 15  CBC with  Differential  Result Value Ref Range   WBC 6.3 4.0 - 10.5 K/uL   RBC 3.89 3.87 - 5.11 MIL/uL   Hemoglobin 11.1 (L) 12.0 - 15.0 g/dL   HCT 42.5 (L) 95.6 - 38.7 %   MCV 88.9 78.0 - 100.0 fL   MCH 28.5 26.0 - 34.0 pg   MCHC 32.1 30.0 - 36.0 g/dL   RDW 56.4 33.2 - 95.1 %   Platelets 381 150 - 400 K/uL   Neutrophils Relative % 47 %   Neutro Abs 3.0 1.7 - 7.7 K/uL   Lymphocytes Relative 43 %   Lymphs Abs 2.7 0.7 - 4.0 K/uL   Monocytes Relative 7 %   Monocytes Absolute 0.5 0.1 - 1.0 K/uL   Eosinophils Relative 2 %   Eosinophils Absolute 0.1 0.0 - 0.7 K/uL   Basophils Relative 1 %   Basophils Absolute 0.0 0.0 - 0.1 K/uL  Urine rapid drug screen (hosp performed)  Result Value Ref Range   Opiates NONE DETECTED NONE DETECTED   Cocaine NONE DETECTED NONE DETECTED   Benzodiazepines NONE DETECTED NONE DETECTED   Amphetamines NONE DETECTED NONE DETECTED   Tetrahydrocannabinol NONE DETECTED NONE DETECTED   Barbiturates NONE DETECTED NONE DETECTED  Magnesium  Result Value Ref Range   Magnesium 1.7 1.7 - 2.4 mg/dL   Laboratory interpretation all normal except anemia    EKG None  Radiology Ct Head Wo Contrast  Result Date: 05/26/2018 CLINICAL DATA:  Numbness and tingling to the right side for 4 days. Slurred speech. Dizzy and difficulty sleeping. EXAM: CT HEAD WITHOUT CONTRAST TECHNIQUE: Contiguous axial images were obtained from the base of the skull through the vertex without intravenous contrast. COMPARISON:  None. FINDINGS: Brain: No evidence of acute infarction, hemorrhage, hydrocephalus, extra-axial collection or mass lesion/mass effect. Vascular: No hyperdense vessel or unexpected calcification. Skull: Normal. Negative for fracture or focal lesion. Sinuses/Orbits: No acute finding. Other: None. IMPRESSION: No acute intracranial abnormalities. Electronically Signed   By: Burman Nieves M.D.   On: 05/26/2018 04:09    Procedures Procedures (including critical care  time)  Medications Ordered in ED Medications - No data to display   Initial Impression / Assessment and Plan / ED Course  I have reviewed the triage vital signs and the nursing notes.  Pertinent labs & imaging results that were available during my care of the patient were reviewed by me and considered in my medical decision making (see chart for details).     Patient symptoms are puzzling.  She has bilateral sensory "tingling" sensations of both feet both hands and her left leg.  CT scan was done to rule out acute stroke, her symptoms started 4 days ago so CT should show an abnormality of present.  Laboratory testing was done.  Vitamin B12 was added.  The long and short of it is I think patient has a history of PTSD and stress and her one year anniversary since her mother died is coming up shortly and I think she is just under more stress from that.  She was advised to follow back up with her psychiatrist, she can see her primary care doctor or Dr. Gerilyn Pilgrim, neurologist about her paresthesias.  Final Clinical Impressions(s) / ED Diagnoses   Final diagnoses:  Paresthesias    ED Discharge Orders    None     Plan discharge  Devoria Albe, MD, Concha Pyo, MD 05/26/18 [phone]

## 2018-07-13 ENCOUNTER — Other Ambulatory Visit: Payer: Self-pay

## 2018-07-13 ENCOUNTER — Emergency Department
Admission: EM | Admit: 2018-07-13 | Discharge: 2018-07-13 | Disposition: A | Discharge status: Home / Self Care | Payer: Medicaid Other | Attending: Emergency Medicine | Admitting: Emergency Medicine

## 2018-07-13 DIAGNOSIS — Z9049 Acquired absence of other specified parts of digestive tract: Secondary | ICD-10-CM | POA: Insufficient documentation

## 2018-07-13 DIAGNOSIS — Z79899 Other long term (current) drug therapy: Secondary | ICD-10-CM | POA: Insufficient documentation

## 2018-07-13 DIAGNOSIS — J45909 Unspecified asthma, uncomplicated: Secondary | ICD-10-CM | POA: Insufficient documentation

## 2018-07-13 DIAGNOSIS — F1190 Opioid use, unspecified, uncomplicated: Secondary | ICD-10-CM | POA: Insufficient documentation

## 2018-07-13 DIAGNOSIS — M5441 Lumbago with sciatica, right side: Secondary | ICD-10-CM | POA: Insufficient documentation

## 2018-07-13 DIAGNOSIS — F319 Bipolar disorder, unspecified: Secondary | ICD-10-CM | POA: Insufficient documentation

## 2018-07-13 LAB — URINALYSIS, COMPLETE (UACMP) WITH MICROSCOPIC
Bacteria, UA: NONE SEEN
Bilirubin Urine: NEGATIVE
Glucose, UA: NEGATIVE mg/dL
Ketones, ur: NEGATIVE mg/dL
Leukocytes, UA: NEGATIVE
Nitrite: NEGATIVE
Protein, ur: NEGATIVE mg/dL
Specific Gravity, Urine: 1.018 (ref 1.005–1.030)
pH: 5 (ref 5.0–8.0)

## 2018-07-13 MED ORDER — PREDNISONE 20 MG PO TABS
60.0000 mg | ORAL_TABLET | Freq: Once | ORAL | Status: AC
  Administered 2018-07-13: 60 mg via ORAL
  Filled 2018-07-13: qty 3

## 2018-07-13 MED ORDER — METHOCARBAMOL 500 MG PO TABS: 500.0000 mg | ORAL_TABLET | Freq: Three times a day (TID) | ORAL | 0 refills | Status: AC | PRN

## 2018-07-13 MED ORDER — PREDNISONE 50 MG PO TABS: ORAL_TABLET | ORAL | 0 refills | Status: DC

## 2018-07-13 NOTE — ED Triage Notes (Signed)
Pt arrives to ED via POV from home with c/o lower back pain. Pt denies any recent falls, injury, or trauma. Pt reports previous h/x of DDD, but hasn't has a "problem in years". Pt denies any loss of sensation in the lower extremities, no loss of bowel or bladder control.

## 2018-07-13 NOTE — ED Provider Notes (Signed)
[facility] Emergency Department Provider Note  ____________________________________________  Time seen: Approximately 9:06 PM  I have reviewed the triage vital signs and the nursing notes.   HISTORY  Chief Complaint Back Pain    HPI Sierra Wiley is a 47 y.o. female presents to the emergency department with 10 out of 10 right-sided low back pain with right lower extremity radiculopathy that has worsened in intensity over the past 2 days.  Patient reports a history of degenerative disc disease but states that she has not had similar symptoms in several years.  She denies recent falls or traumas.  She denies having surgeries to the low back.  Patient has taken ibuprofen, which has not relieved her symptoms.   Past Medical History:  Diagnosis Date  . Asthma   . Bipolar 1 disorder (HCC)   . Chronic back pain   . Depression   . Opiate use 02/21/2016  . PTSD (post-traumatic stress disorder)   . Seizures [facility])     Patient Active Problem List   Diagnosis Date Noted  . 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 Bipolar 1 disorder, depressed (HCC) 09/05/2016  . Chronic pain 02/21/2016  . Asthma 11/06/2015  . Post traumatic stress disorder 07/01/2015    Past Surgical History:  Procedure Laterality Date  . CHOLECYSTECTOMY    . ELBOW SURGERY Left 2000  . TUBAL LIGATION      Prior to Admission medications   Medication Sig Start Date End Date Taking? Authorizing Provider  albuterol (PROVENTIL HFA;VENTOLIN HFA) 108 (90 Base) MCG/ACT inhaler Inhale 2 puffs into the lungs every 6 (six) hours as needed for wheezing or shortness of breath.    [provider]  beclomethasone (QVAR) 80 MCG/ACT inhaler Inhale 2 puffs into the lungs 2 (two) times daily.    [provider]  benzonatate (TESSALON PERLES) 100 MG capsule Take 2 capsules (200 mg total) by mouth 3 (three) times daily as needed. 01/06/18 01/06/19  Joni Reining, PA-C  benzonatate  (TESSALON PERLES) 100 MG capsule Take 1 capsule (100 mg total) by mouth every 6 (six) hours as needed for cough. 02/04/18 02/04/19  Merrily Brittle, MD  chlorpheniramine-HYDROcodone (TUSSIONEX PENNKINETIC ER) 10-8 MG/5ML SUER Take 5 mLs by mouth at bedtime as needed for cough. 01/06/18   Joni Reining, PA-C  clonazePAM (KLONOPIN) 0.5 MG tablet Take 1 tablet by mouth 2 (two) times daily. As needed    [provider]  docusate sodium (COLACE) 100 MG capsule Take 1 capsule (100 mg total) by mouth daily as needed. 01/31/18 01/31/19  Myrna Blazer, MD  gabapentin (NEURONTIN) 300 MG capsule Take 300 mg by mouth 2 (two) times a week.    [provider]  magnesium citrate SOLN Take 296 mLs (1 Bottle total) by mouth once as needed for up to 1 dose for severe constipation. 01/31/18   Schaevitz, Myra Rude, MD  methocarbamol (ROBAXIN) 500 MG tablet Take 1 tablet (500 mg total) by mouth 3 (three) times daily as needed for up to 5 days. 07/13/18 07/18/18  Orvil Feil, PA-C  omeprazole (PRILOSEC) 40 MG capsule Take 1 capsule (40 mg total) by mouth daily. 01/31/18 01/31/19  Myrna Blazer, MD  oxybutynin (DITROPAN-XL) 10 MG 24 hr tablet Take 10 mg by mouth at bedtime.    [provider]  predniSONE (DELTASONE) 50 MG tablet Take one 50 mg tablet once daily for the next four days. 07/13/18   Orvil Feil, PA-C  propranolol (INDERAL) 20 MG tablet Take 10 mg by mouth 2 (two) times daily.    [provider]  traZODone (DESYREL) 100 MG tablet Take 150-300 mg by mouth at bedtime.    [provider]    Allergies Penicillins  Family History  Problem Relation Age of Onset  . Alcohol abuse Mother   . Drug abuse Mother   . HIV Mother   . Bipolar disorder Mother   . Cirrhosis Mother   . Cancer Father   . Heart disease Father   . Drug abuse Sister   . Alcohol abuse Sister     Social History Social History   Tobacco Use  . Smoking status: Never Smoker   . Smokeless tobacco: Never Used  Substance Use Topics  . Alcohol use: No    Alcohol/week: 0.0 oz  . Drug use: No     Review of Systems  Constitutional: No fever/chills Eyes: No visual changes. No discharge ENT: No upper respiratory complaints. Cardiovascular: no chest pain. Respiratory: no cough. No SOB. Gastrointestinal: No abdominal pain.  No nausea, no vomiting.  No diarrhea.  No constipation. Genitourinary: Negative for dysuria. No hematuria Musculoskeletal: Patient has low back pain. Skin: Negative for rash, abrasions, lacerations, ecchymosis. Neurological: Negative for headaches, focal weakness or numbness.   ____________________________________________   PHYSICAL EXAM:  VITAL SIGNS: ED Triage Vitals  Enc Vitals Group     BP 07/13/18 1933 122/68     Pulse Rate 07/13/18 1933 92     Resp 07/13/18 1933 17     Temp 07/13/18 1933 98.7 F (37.1 C)     Temp Source 07/13/18 1933 Oral     SpO2 07/13/18 1933 94 %     Weight 07/13/18 1934 240 lb (108.9 kg)     Height 07/13/18 1934 5\' 3"  (1.6 m)     Head Circumference --      Peak Flow --      Pain Score 07/13/18 1933 10     Pain Loc --      Pain Edu? --      Excl. in GC? --      Constitutional: Alert and oriented. Well appearing and in no acute distress. Eyes: Conjunctivae are normal. PERRL. EOMI. Head: Atraumatic. Cardiovascular: Normal rate, regular rhythm. Normal S1 and S2.  Good peripheral circulation. Respiratory: Normal respiratory effort without tachypnea or retractions. Lungs CTAB. Good air entry to the bases with no decreased or absent breath sounds. Gastrointestinal: Bowel sounds 4 quadrants. Soft and nontender to palpation. No guarding or rigidity. No palpable masses. No distention. No CVA tenderness. Musculoskeletal: Full range of motion to all extremities. No gross deformities appreciated.  Patient has paraspinal muscle tenderness elicited with palpation of the lumbar spine.  Positive straight leg  raise, right. Neurologic:  Normal speech and language. No gross focal neurologic deficits are appreciated.  Skin:  Skin is warm, dry and intact. No rash noted. Psychiatric: Mood and affect are normal. Speech and behavior are normal. Patient exhibits appropriate insight and judgement.   ____________________________________________   LABS (all labs ordered are listed, but only abnormal results are displayed)  Labs Reviewed  URINALYSIS, COMPLETE (UACMP) WITH MICROSCOPIC - Abnormal; Notable for the following components:      Result Value   Color, Urine YELLOW (*)    APPearance CLEAR (*)    Hgb urine dipstick MODERATE (*)    All other components within normal limits   ____________________________________________  EKG   ____________________________________________  RADIOLOGY  No results found.  ____________________________________________    PROCEDURES  Procedure(s) performed:    Procedures    Medications  predniSONE (DELTASONE) tablet 60 mg (has no administration in time range)     ____________________________________________   INITIAL IMPRESSION / ASSESSMENT AND PLAN / ED COURSE  Pertinent labs & imaging results that were available during my care of the patient were reviewed by me and considered in my medical decision making (see chart for details).  Review of the [place] CSRS was performed in accordance of the [facility] prior to dispensing any controlled drugs.   Assessment and plan Low back pain Patient presents to the emergency department with low back pain with right lower extremity radiculopathy in the absence of trauma, refractory to ibuprofen use.  Patient was treated empirically with prednisone.  She was given her first dose in the emergency department and advised to follow-up with primary care as needed.  All patient questions were answered.      ____________________________________________  FINAL CLINICAL IMPRESSION(S) / ED DIAGNOSES  Final diagnoses:   Acute right-sided low back pain with right-sided sciatica      NEW MEDICATIONS STARTED DURING THIS VISIT:  ED Discharge Orders        Ordered    predniSONE (DELTASONE) 50 MG tablet     07/13/18 2106    methocarbamol (ROBAXIN) 500 MG tablet  3 times daily PRN     07/13/18 2106          This chart was dictated using voice recognition software/Dragon. Despite best efforts to proofread, errors can occur which can change the meaning. Any change was purely unintentional.    Gasper LloydWoods, Alden Feagan M, PA-C 07/13/18 2110    Phineas SemenGoodman, [place], MD 07/13/18 2215

## 2018-07-13 NOTE — ED Notes (Addendum)
Patient discharge and follow up information reviewed with patient by ED nursing staff and patient given the opportunity to ask questions pertaining to ED visit and discharge plan of care. Patient advised that should symptoms not continue to improve, resolve entirely, or should new symptoms develop then a follow up visit with their PCP or a return visit to the ED may be warranted. Patient verbalized consent and understanding of discharge plan of care including potential need for further evaluation. Patient being discharged in stable condition per attending ED physician on duty.    Pt declined BP recheck prior to DC

## 2018-10-30 ENCOUNTER — Encounter: Payer: Self-pay | Admitting: Emergency Medicine

## 2018-10-30 ENCOUNTER — Emergency Department: Payer: Medicaid Other

## 2018-10-30 ENCOUNTER — Other Ambulatory Visit: Payer: Self-pay

## 2018-10-30 ENCOUNTER — Emergency Department
Admission: EM | Admit: 2018-10-30 | Discharge: 2018-10-30 | Disposition: A | Discharge status: Home / Self Care | Payer: Medicaid Other | Attending: Emergency Medicine | Admitting: Emergency Medicine

## 2018-10-30 DIAGNOSIS — M7918 Myalgia, other site: Secondary | ICD-10-CM

## 2018-10-30 DIAGNOSIS — Z79899 Other long term (current) drug therapy: Secondary | ICD-10-CM | POA: Insufficient documentation

## 2018-10-30 DIAGNOSIS — F319 Bipolar disorder, unspecified: Secondary | ICD-10-CM | POA: Insufficient documentation

## 2018-10-30 DIAGNOSIS — Z9049 Acquired absence of other specified parts of digestive tract: Secondary | ICD-10-CM | POA: Insufficient documentation

## 2018-10-30 DIAGNOSIS — M159 Polyosteoarthritis, unspecified: Secondary | ICD-10-CM | POA: Insufficient documentation

## 2018-10-30 DIAGNOSIS — M150 Primary generalized (osteo)arthritis: Secondary | ICD-10-CM

## 2018-10-30 DIAGNOSIS — J45909 Unspecified asthma, uncomplicated: Secondary | ICD-10-CM | POA: Insufficient documentation

## 2018-10-30 MED ORDER — MELOXICAM 15 MG PO TABS: 15.0000 mg | ORAL_TABLET | Freq: Every day | ORAL | 0 refills | Status: DC

## 2018-10-30 MED ORDER — TRAMADOL HCL 50 MG PO TABS: 50.0000 mg | ORAL_TABLET | Freq: Two times a day (BID) | ORAL | 0 refills | Status: DC | PRN

## 2018-10-30 MED ORDER — IBUPROFEN 600 MG PO TABS
600.0000 mg | ORAL_TABLET | Freq: Once | ORAL | Status: AC
  Administered 2018-10-30: 600 mg via ORAL
  Filled 2018-10-30: qty 1

## 2018-10-30 MED ORDER — OXYCODONE-ACETAMINOPHEN 5-325 MG PO TABS
1.0000 | ORAL_TABLET | Freq: Once | ORAL | Status: AC
  Administered 2018-10-30: 1 via ORAL
  Filled 2018-10-30: qty 1

## 2018-10-30 NOTE — ED Provider Notes (Signed)
[facility] Emergency Department Provider Note   ____________________________________________   First MD Initiated Contact with Patient 10/30/18 1158     (approximate)  I have reviewed the triage vital signs and the nursing notes.   HISTORY  Chief Complaint Fall    HPI Sierra Wiley is a 47 y.o. female patient complain of a right elbow and right hip pain secondary to fall.  Patient state she was doing her daily walk when she is noticed a strange dog causing her to run trip and fall.  Patient rates the pain as 8/10.  Patient, pain is "aching".  Patient denies loss of sensation or loss of motion.  Patient did pain increased with moving the right elbow or hip.  Past Medical History:  Diagnosis Date  . Asthma   . Bipolar 1 disorder (HCC)   . Chronic back pain   . Depression   . Opiate use 02/21/2016  . PTSD (post-traumatic stress disorder)   . Seizures [facility])     Patient Active Problem List   Diagnosis Date Noted  . 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 Bipolar 1 disorder, depressed (HCC) 09/05/2016  . Chronic pain 02/21/2016  . Asthma 11/06/2015  . Post traumatic stress disorder 07/01/2015    Past Surgical History:  Procedure Laterality Date  . CHOLECYSTECTOMY    . ELBOW SURGERY Left 2000  . TUBAL LIGATION      Prior to Admission medications   Medication Sig Start Date End Date Taking? Authorizing Provider  albuterol (PROVENTIL HFA;VENTOLIN HFA) 108 (90 Base) MCG/ACT inhaler Inhale 2 puffs into the lungs every 6 (six) hours as needed for wheezing or shortness of breath.    [provider]  beclomethasone (QVAR) 80 MCG/ACT inhaler Inhale 2 puffs into the lungs 2 (two) times daily.    [provider]  benzonatate (TESSALON PERLES) 100 MG capsule Take 2 capsules (200 mg total) by mouth 3 (three) times daily as needed. 01/06/18 01/06/19  Joni Reining, PA-C  benzonatate (TESSALON PERLES) 100 MG capsule Take 1 capsule  (100 mg total) by mouth every 6 (six) hours as needed for cough. 02/04/18 02/04/19  Merrily Brittle, MD  chlorpheniramine-HYDROcodone (TUSSIONEX PENNKINETIC ER) 10-8 MG/5ML SUER Take 5 mLs by mouth at bedtime as needed for cough. 01/06/18   Joni Reining, PA-C  clonazePAM (KLONOPIN) 0.5 MG tablet Take 1 tablet by mouth 2 (two) times daily. As needed    [provider]  docusate sodium (COLACE) 100 MG capsule Take 1 capsule (100 mg total) by mouth daily as needed. 01/31/18 01/31/19  Myrna Blazer, MD  gabapentin (NEURONTIN) 300 MG capsule Take 300 mg by mouth 2 (two) times a week.    [provider]  magnesium citrate SOLN Take 296 mLs (1 Bottle total) by mouth once as needed for up to 1 dose for severe constipation. 01/31/18   Schaevitz, Myra Rude, MD  meloxicam (MOBIC) 15 MG tablet Take 1 tablet (15 mg total) by mouth daily. 10/30/18   Joni Reining, PA-C  omeprazole (PRILOSEC) 40 MG capsule Take 1 capsule (40 mg total) by mouth daily. 01/31/18 01/31/19  Myrna Blazer, MD  oxybutynin (DITROPAN-XL) 10 MG 24 hr tablet Take 10 mg by mouth at bedtime.    [provider]  predniSONE (DELTASONE) 50 MG tablet Take one 50 mg tablet once daily for the next four days. 07/13/18   Orvil Feil, PA-C  propranolol (INDERAL) 20 MG tablet Take 10  mg by mouth 2 (two) times daily.    [provider]  traMADol (ULTRAM) 50 MG tablet Take 1 tablet (50 mg total) by mouth every 12 (twelve) hours as needed. 10/30/18   Joni Reining, PA-C  traZODone (DESYREL) 100 MG tablet Take 150-300 mg by mouth at bedtime.    [provider]    Allergies Penicillins  Family History  Problem Relation Age of Onset  . Alcohol abuse Mother   . Drug abuse Mother   . HIV Mother   . Bipolar disorder Mother   . Cirrhosis Mother   . Cancer Father   . Heart disease Father   . Drug abuse Sister   . Alcohol abuse Sister     Social History Social History   Tobacco Use  .  Smoking status: Never Smoker  . Smokeless tobacco: Never Used  Substance Use Topics  . Alcohol use: No    Alcohol/week: 0.0 standard drinks  . Drug use: No    Review of Systems  Constitutional: No fever/chills Eyes: No visual changes. ENT: No sore throat. Cardiovascular: Denies chest pain. Respiratory: Denies shortness of breath. Gastrointestinal: No abdominal pain.  No nausea, no vomiting.  No diarrhea.  No constipation. Genitourinary: Negative for dysuria. Musculoskeletal: Right elbow and right hip pain. Neurological: Negative for headaches, focal weakness or numbness. Psychiatric:Bipolar, depression, opiate abuse, and PTSD. Allergic/Immunilogical: Penicillin ____________________________________________   PHYSICAL EXAM:  VITAL SIGNS: ED Triage Vitals  Enc Vitals Group     BP 10/30/18 1124 115/75     Pulse Rate 10/30/18 1124 99     Resp 10/30/18 1124 18     Temp 10/30/18 1124 98.1 F (36.7 C)     Temp Source 10/30/18 1124 Oral     SpO2 10/30/18 1124 99 %     Weight 10/30/18 1125 215 lb (97.5 kg)     Height 10/30/18 1125 5\' 3"  (1.6 m)     Head Circumference --      Peak Flow --      Pain Score 10/30/18 1125 8     Pain Loc --      Pain Edu? --      Excl. in GC? --    Constitutional: Alert and oriented. Well appearing and in no acute distress. Neck:No cervical spine tenderness to palpation. Hematological/Lymphatic/Immunilogical: No cervical lymphadenopathy. Cardiovascular: Normal rate, regular rhythm. Grossly normal heart sounds.  Good peripheral circulation. Respiratory: Normal respiratory effort.  No retractions. Lungs CTAB. Gastrointestinal: Soft and nontender. No distention. No abdominal bruits. No CVA tenderness. Musculoskeletal: No obvious deformity to the right elbow.  Patient has decreased range of motion with extension limited by complaint of pain.  Examination of the hip shows no leg length discrepancy.  Patient has some moderate guarding palpation to  greater trochanter.  Patient has full and equal range of motion. Neurologic:  Normal speech and language. No gross focal neurologic deficits are appreciated. No gait instability. Skin:  Skin is warm, dry and intact. No rash noted.  No abrasion or ecchymosis. Psychiatric: Mood and affect are normal. Speech and behavior are normal.  ____________________________________________   LABS (all labs ordered are listed, but only abnormal results are displayed)  Labs Reviewed - No data to display ____________________________________________  EKG   ____________________________________________  RADIOLOGY  ED MD interpretation:    Official radiology report(s): Dg Elbow Complete Right  Result Date: 10/30/2018 CLINICAL DATA:  Pain following fall EXAM: RIGHT ELBOW - COMPLETE 3+ VIEW COMPARISON:  October 04, 2015 FINDINGS:  Frontal, lateral, and bilateral oblique views were obtained. There is no acute fracture or dislocation. No joint effusion. There is spurring arising from the coracoid and olecranon processes of the proximal ulna. There is calcification in the posterior aspect of the joint, felt to be due to arthropathic change. There is probable calcific tendinosis along the lateral distal humeral condyle. No erosive change. IMPRESSION: Areas of osteoarthritic change. Apparent calcific tendinosis along the lateral distal humeral condyle. No fracture or dislocation evident. Electronically Signed   By: Bretta Bang III M.D.   On: 10/30/2018 12:03   Dg Hip Unilat  With Pelvis 2-3 Views Right  Result Date: 10/30/2018 CLINICAL DATA:  Pain following fall EXAM: DG HIP (WITH OR WITHOUT PELVIS) 2-3V RIGHT COMPARISON:  None. FINDINGS: Frontal pelvis as well as frontal and lateral hip joints bilaterally obtained. There is no fracture or dislocation. There is mild symmetric narrowing of each hip joint. There is bony overgrowth along each lateral acetabulum. Sacroiliac joints appear unremarkable bilaterally. No  erosive changes. IMPRESSION: Mild symmetric narrowing of each hip joint. Bony overgrowth along the lateral aspect of each hip joint superiorly. This finding places patient at increased risk for potential femoroacetabular impingement. No acute fracture or dislocation. Electronically Signed   By: Bretta Bang III M.D.   On: 10/30/2018 12:01    ____________________________________________   PROCEDURES  Procedure(s) performed: None  Procedures  Critical Care performed: No  ____________________________________________   INITIAL IMPRESSION / ASSESSMENT AND PLAN / ED COURSE  As part of my medical decision making, I reviewed the following data within the electronic MEDICAL RECORD NUMBER    Right elbow and left hip pain secondary to fall.  Discussed patient x-ray findings of the upper lower extremity was consistent with osteoarthritis but no other acute findings.  Patient given discharge care instructions and a prescription for meloxicam and tramadol.  Patient advised to establish care with the PCP.      ____________________________________________   FINAL CLINICAL IMPRESSION(S) / ED DIAGNOSES  Final diagnoses:  Musculoskeletal pain  Primary osteoarthritis involving multiple joints     ED Discharge Orders         Ordered    meloxicam (MOBIC) 15 MG tablet  Daily     10/30/18 1215    traMADol (ULTRAM) 50 MG tablet  Every 12 hours PRN     10/30/18 1215           Note:  This document was prepared using Dragon voice recognition software and may include unintentional dictation errors.    Joni Reining, PA-C 10/30/18 1223    Pershing Proud Myra Rude, MD 10/30/18 [phone]

## 2018-10-30 NOTE — ED Triage Notes (Signed)
PT arrived via ems after pt had mechanical fall. Pt reports when she fell she hurt her right elbow and right hip. No shortening or rotation. Pt able to move all extremeties but pain is elicited with movement.

## 2018-10-30 NOTE — ED Notes (Signed)
See triage note  Presents s/p fall   States she was waving a stick  And fell   Landed on right arm and hip area

## 2018-10-30 NOTE — ED Notes (Signed)
Pt verbalized understanding of discharge instructions. NAD at this time. 

## 2018-12-13 ENCOUNTER — Encounter: Payer: Self-pay | Admitting: Emergency Medicine

## 2018-12-13 ENCOUNTER — Emergency Department
Admission: EM | Admit: 2018-12-13 | Discharge: 2018-12-13 | Disposition: A | Discharge status: Home / Self Care | Payer: Medicaid Other | Attending: Emergency Medicine | Admitting: Emergency Medicine

## 2018-12-13 ENCOUNTER — Other Ambulatory Visit: Payer: Self-pay

## 2018-12-13 DIAGNOSIS — M5431 Sciatica, right side: Secondary | ICD-10-CM | POA: Insufficient documentation

## 2018-12-13 DIAGNOSIS — J45909 Unspecified asthma, uncomplicated: Secondary | ICD-10-CM | POA: Insufficient documentation

## 2018-12-13 DIAGNOSIS — Z79899 Other long term (current) drug therapy: Secondary | ICD-10-CM | POA: Insufficient documentation

## 2018-12-13 MED ORDER — ORPHENADRINE CITRATE 30 MG/ML IJ SOLN
60.0000 mg | Freq: Once | INTRAMUSCULAR | Status: AC
Start: 2018-12-13 — End: 2018-12-13
  Administered 2018-12-13: 60 mg via INTRAMUSCULAR
  Filled 2018-12-13: qty 2

## 2018-12-13 MED ORDER — OXYCODONE-ACETAMINOPHEN 5-325 MG PO TABS
1.0000 | ORAL_TABLET | Freq: Once | ORAL | Status: AC
  Administered 2018-12-13: 1 via ORAL
  Filled 2018-12-13: qty 1

## 2018-12-13 MED ORDER — TRAMADOL HCL 50 MG PO TABS: 50.0000 mg | ORAL_TABLET | Freq: Four times a day (QID) | ORAL | 0 refills | Status: DC | PRN

## 2018-12-13 MED ORDER — DEXAMETHASONE SODIUM PHOSPHATE 10 MG/ML IJ SOLN
10.0000 mg | Freq: Once | INTRAMUSCULAR | Status: AC
  Administered 2018-12-13: 10 mg via INTRAMUSCULAR
  Filled 2018-12-13: qty 1

## 2018-12-13 MED ORDER — PREDNISONE 50 MG PO TABS: 50.0000 mg | ORAL_TABLET | Freq: Every day | ORAL | 0 refills | Status: DC

## 2018-12-13 NOTE — ED Notes (Signed)
Patient to waiting room via wheelchair by EMS for lower back pain.

## 2018-12-13 NOTE — ED Provider Notes (Signed)
[facility] Emergency Department Provider Note  ____________________________________________  Time seen: Approximately 10:50 PM  I have reviewed the triage vital signs and the nursing notes.   HISTORY  Chief Complaint Back Pain    HPI Sierra Wiley is a 47 y.o. female who presents the emergency department complaining of lower back pain.  Patient has a long history of degenerative disc disease with ongoing back pain.  Patient reports that she was moving the last 2 days, had an increase in her chronic pain.  Patient has sciatica and has radicular symptoms down the right leg.  No bowel or bladder dysfunction, saddle anesthesia or paresthesias.  No direct trauma.  Patient denies any urinary symptoms.  Patient reports that she has been told she should see a neurosurgeon but has never done so she is never had referral.  Patient denies any other complaint.    Past Medical History:  Diagnosis Date  . Asthma   . Bipolar 1 disorder (HCC)   . Chronic back pain   . Depression   . Opiate use 02/21/2016  . PTSD (post-traumatic stress disorder)   . Seizures [facility])     Patient Active Problem List   Diagnosis Date Noted  . 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 Bipolar 1 disorder, depressed (HCC) 09/05/2016  . Chronic pain 02/21/2016  . Asthma 11/06/2015  . Post traumatic stress disorder 07/01/2015    Past Surgical History:  Procedure Laterality Date  . CHOLECYSTECTOMY    . ELBOW SURGERY Left 2000  . TUBAL LIGATION      Prior to Admission medications   Medication Sig Start Date End Date Taking? Authorizing Provider  albuterol (PROVENTIL HFA;VENTOLIN HFA) 108 (90 Base) MCG/ACT inhaler Inhale 2 puffs into the lungs every 6 (six) hours as needed for wheezing or shortness of breath.   Yes [provider]  beclomethasone (QVAR) 80 MCG/ACT inhaler Inhale 2 puffs into the lungs 2 (two) times daily.   Yes [provider]  tiZANidine  (ZANAFLEX) 4 MG tablet Take 4 mg by mouth 2 (two) times daily.   Yes [provider]  clonazePAM (KLONOPIN) 0.5 MG tablet Take 1 tablet by mouth 2 (two) times daily. As needed    [provider]  docusate sodium (COLACE) 100 MG capsule Take 1 capsule (100 mg total) by mouth daily as needed. 01/31/18 01/31/19  Myrna Blazer, MD  gabapentin (NEURONTIN) 300 MG capsule Take 300 mg by mouth 2 (two) times a week.    [provider]  magnesium citrate SOLN Take 296 mLs (1 Bottle total) by mouth once as needed for up to 1 dose for severe constipation. 01/31/18   Schaevitz, Myra Rude, MD  meloxicam (MOBIC) 15 MG tablet Take 1 tablet (15 mg total) by mouth daily. 10/30/18   Joni Reining, PA-C  omeprazole (PRILOSEC) 40 MG capsule Take 1 capsule (40 mg total) by mouth daily. 01/31/18 01/31/19  Myrna Blazer, MD  oxybutynin (DITROPAN-XL) 10 MG 24 hr tablet Take 10 mg by mouth at bedtime.    [provider]  predniSONE (DELTASONE) 50 MG tablet Take 1 tablet (50 mg total) by mouth daily with breakfast. 12/13/18   Kymari Lollis, Delorise Royals, PA-C  propranolol (INDERAL) 20 MG tablet Take 10 mg by mouth 2 (two) times daily.    [provider]  traMADol (ULTRAM) 50 MG tablet Take 1 tablet (50 mg total) by mouth every 6 (six) hours as needed. 12/13/18   Cortez Steelman, Christiane Ha  D, PA-C  traZODone (DESYREL) 100 MG tablet Take 150-300 mg by mouth at bedtime.    [provider]    Allergies Penicillins  Family History  Problem Relation Age of Onset  . Alcohol abuse Mother   . Drug abuse Mother   . HIV Mother   . Bipolar disorder Mother   . Cirrhosis Mother   . Cancer Father   . Heart disease Father   . Drug abuse Sister   . Alcohol abuse Sister     Social History Social History   Tobacco Use  . Smoking status: Never Smoker  . Smokeless tobacco: Never Used  Substance Use Topics  . Alcohol use: No    Alcohol/week: 0.0 standard drinks  . Drug  use: No     Review of Systems  Constitutional: No fever/chills Eyes: No visual changes. No discharge ENT: No upper respiratory complaints. Cardiovascular: no chest pain. Respiratory: no cough. No SOB. Gastrointestinal: No abdominal pain.  No nausea, no vomiting.  No diarrhea.  No constipation. Genitourinary: Negative for dysuria. No hematuria Musculoskeletal: Increasing sciatica right side Skin: Negative for rash, abrasions, lacerations, ecchymosis. Neurological: Negative for headaches, focal weakness or numbness. 10-point ROS otherwise negative.  ____________________________________________   PHYSICAL EXAM:  VITAL SIGNS: ED Triage Vitals  Enc Vitals Group     BP 12/13/18 2225 140/84     Pulse Rate 12/13/18 2225 85     Resp 12/13/18 2225 17     Temp 12/13/18 2225 98.3 F (36.8 C)     Temp Source 12/13/18 2225 Oral     SpO2 12/13/18 2225 100 %     Weight 12/13/18 2227 213 lb (96.6 kg)     Height 12/13/18 2227 5\' 3"  (1.6 m)     Head Circumference --      Peak Flow --      Pain Score 12/13/18 2226 7     Pain Loc --      Pain Edu? --      Excl. in GC? --      Constitutional: Alert and oriented. Well appearing and in no acute distress. Eyes: Conjunctivae are normal. PERRL. EOMI. Head: Atraumatic. Neck: No stridor.    Cardiovascular: Normal rate, regular rhythm. Normal S1 and S2.  Good peripheral circulation. Respiratory: Normal respiratory effort without tachypnea or retractions. Lungs CTAB. Good air entry to the bases with no decreased or absent breath sounds. Gastrointestinal: Bowel sounds 4 quadrants. Soft and nontender to palpation. No guarding or rigidity. No palpable masses. No distention. No CVA tenderness. Musculoskeletal: Full range of motion to all extremities. No gross deformities appreciated.  Visualization of the lumbar spine reveals no visible abnormality.  Patient is tender to palpation both midline and right paraspinal muscle region extending into the  right-sided sciatic notch.  No tenderness left side.  Negative straight leg raise bilaterally.  Dorsalis pedis pulse intact bilateral lower extremities.  Sensation intact and equal in all dermatomal distributions bilateral lower extremities. Neurologic:  Normal speech and language. No gross focal neurologic deficits are appreciated.  Skin:  Skin is warm, dry and intact. No rash noted. Psychiatric: Mood and affect are normal. Speech and behavior are normal. Patient exhibits appropriate insight and judgement.   ____________________________________________   LABS (all labs ordered are listed, but only abnormal results are displayed)  Labs Reviewed - No data to display ____________________________________________  EKG   ____________________________________________  RADIOLOGY   No results found.  ____________________________________________    PROCEDURES  Procedure(s) performed:  Procedures    Medications  dexamethasone (DECADRON) injection 10 mg (has no administration in time range)  orphenadrine (NORFLEX) injection 60 mg (has no administration in time range)  oxyCODONE-acetaminophen (PERCOCET/ROXICET) 5-325 MG per tablet 1 tablet (has no administration in time range)     ____________________________________________   INITIAL IMPRESSION / ASSESSMENT AND PLAN / ED COURSE  Pertinent labs & imaging results that were available during my care of the patient were reviewed by me and considered in my medical decision making (see chart for details).  Review of the [place] CSRS was performed in accordance of the [facility] prior to dispensing any controlled drugs.      Patient's diagnosis is consistent with sciatica.  Patient presents emergency department with increased sciatica symptoms after moving over the last 2 days.  She has a history of degenerative disc disease.  No concerning symptoms warranting further investigation with imaging at this time.  Patient denies any urinary or  GI symptoms.  Patient is given injection of steroid, muscle relaxer and Percocet in the emergency department for pain relief.. Patient will be discharged home with prescriptions for short course of steroid, take already prescribed tizanidine, limited prescription of Ultram. Patient is to follow up with neurosurgery as needed or otherwise directed. Patient is given ED precautions to return to the ED for any worsening or new symptoms.     ____________________________________________  FINAL CLINICAL IMPRESSION(S) / ED DIAGNOSES  Final diagnoses:  Sciatica of right side      NEW MEDICATIONS STARTED DURING THIS VISIT:  ED Discharge Orders         Ordered    predniSONE (DELTASONE) 50 MG tablet  Daily with breakfast     12/13/18 2254    traMADol (ULTRAM) 50 MG tablet  Every 6 hours PRN     12/13/18 2254              This chart was dictated using voice recognition software/Dragon. Despite best efforts to proofread, errors can occur which can change the meaning. Any change was purely unintentional.    Racheal Patches, PA-C 12/13/18 2255    Jeanmarie Plant, MD 12/13/18 2322

## 2018-12-13 NOTE — ED Notes (Signed)
See triage note. Pt states she finds it even worse to lay down.

## 2018-12-13 NOTE — ED Triage Notes (Signed)
Pt arrived via ems from home with c/o low back pain that radiates down the back of her right leg; pt reports pain for 13 years; pt says she's been moving the last 2 days and aggravated her sciatic nerve; pt slow to move but able to move on her own;

## 2019-01-01 ENCOUNTER — Ambulatory Visit
Admission: RE | Admit: 2019-01-01 | Discharge: 2019-01-01 | Disposition: A | Discharge status: Home / Self Care | Payer: Disability Insurance | Source: Ambulatory Visit | Attending: Preventative Medicine | Admitting: Preventative Medicine

## 2019-01-01 ENCOUNTER — Other Ambulatory Visit: Payer: Self-pay | Admitting: Preventative Medicine

## 2019-01-01 DIAGNOSIS — M5430 Sciatica, unspecified side: Secondary | ICD-10-CM | POA: Diagnosis not present

## 2019-01-07 ENCOUNTER — Emergency Department
Admission: EM | Admit: 2019-01-07 | Discharge: 2019-01-07 | Disposition: A | Discharge status: Home / Self Care | Payer: Disability Insurance | Attending: Emergency Medicine | Admitting: Emergency Medicine

## 2019-01-07 ENCOUNTER — Other Ambulatory Visit: Payer: Self-pay

## 2019-01-07 DIAGNOSIS — Z79899 Other long term (current) drug therapy: Secondary | ICD-10-CM | POA: Insufficient documentation

## 2019-01-07 DIAGNOSIS — M5431 Sciatica, right side: Secondary | ICD-10-CM

## 2019-01-07 DIAGNOSIS — J45909 Unspecified asthma, uncomplicated: Secondary | ICD-10-CM | POA: Insufficient documentation

## 2019-01-07 MED ORDER — LIDOCAINE 5 % EX PTCH: 1.0000 | MEDICATED_PATCH | CUTANEOUS | 0 refills | Status: DC

## 2019-01-07 MED ORDER — OXYCODONE-ACETAMINOPHEN 5-325 MG PO TABS
1.0000 | ORAL_TABLET | Freq: Once | ORAL | Status: AC
  Administered 2019-01-07: 1 via ORAL
  Filled 2019-01-07: qty 1

## 2019-01-07 MED ORDER — LIDOCAINE 5 % EX PTCH
1.0000 | MEDICATED_PATCH | CUTANEOUS | Status: DC
  Administered 2019-01-07: 1 via TRANSDERMAL
  Filled 2019-01-07: qty 1

## 2019-01-07 MED ORDER — ORPHENADRINE CITRATE 30 MG/ML IJ SOLN
60.0000 mg | Freq: Two times a day (BID) | INTRAMUSCULAR | Status: DC
  Administered 2019-01-07: 60 mg via INTRAMUSCULAR
  Filled 2019-01-07: qty 2

## 2019-01-07 NOTE — Discharge Instructions (Addendum)
Please make every effort to follow-up with the back surgeon on January 15.  Schedule an appointment with primary care for recheck. Continue taking your medications as prescribed.

## 2019-01-07 NOTE — ED Triage Notes (Signed)
Pt c/o chronic back pain and right hip pain since 2017 Pt reports that she is having difficulty moving with stiffness and soreness

## 2019-01-07 NOTE — ED Provider Notes (Signed)
[facility] Emergency Department Provider Note  ____________________________________________  Time seen: Approximately 9:58 AM  I have reviewed the triage vital signs and the nursing notes.   HISTORY  Chief Complaint Back Pain and Hip Pain    HPI Sierra Wiley is a 48 y.o. female that presents to the emergency department for evaluation of right-sided low back pain for 2 to 3 years.  Patient states the back pain started in 2017.  It starts on her low right side and radiates into her right hip and down her right leg.  Back pain has not changed in character recently or today.  She states that it has just gotten progressively worse since 2017.  She has been getting medications from her primary care provider but states that they do not help.  She has been evaluated the couple of times since 2017 for urinary urgency.  She states that when she feels like she has to urinate, she has to run to the bathroom.  She has been checked for a urinary tract infection in the past and they have all been negative.  She denies any UTI symptoms today.  No trauma.  She does not work.  She had x-rays done last week for disability.  No change in bowel movements.  No saddle anesthesias. She takes tizanidine, mobic, and gabapentin for symptoms.  She has an appointment with surgery on January 15 but states that they require a $100 copayment and is trying to come up with the money currently so that she can attend the appointment.  She was seen in the ER for same 1 month ago and states felt better after her visit.  She received a steroid shot in the emergency department and took a course of prednisone.  No fever, chills, abdominal pain.  Past Medical History:  Diagnosis Date  . Asthma   . Bipolar 1 disorder (HCC)   . Chronic back pain   . Depression   . Opiate use 02/21/2016  . PTSD (post-traumatic stress disorder)   . Seizures [facility])     Patient Active Problem List   Diagnosis Date  Noted  . 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 Bipolar 1 disorder, depressed (HCC) 09/05/2016  . Chronic pain 02/21/2016  . Asthma 11/06/2015  . Post traumatic stress disorder 07/01/2015    Past Surgical History:  Procedure Laterality Date  . CHOLECYSTECTOMY    . ELBOW SURGERY Left 2000  . TUBAL LIGATION      Prior to Admission medications   Medication Sig Start Date End Date Taking? Authorizing Provider  albuterol (PROVENTIL HFA;VENTOLIN HFA) 108 (90 Base) MCG/ACT inhaler Inhale 2 puffs into the lungs every 6 (six) hours as needed for wheezing or shortness of breath.    [provider]  beclomethasone (QVAR) 80 MCG/ACT inhaler Inhale 2 puffs into the lungs 2 (two) times daily.    [provider]  clonazePAM (KLONOPIN) 0.5 MG tablet Take 1 tablet by mouth 2 (two) times daily. As needed    [provider]  docusate sodium (COLACE) 100 MG capsule Take 1 capsule (100 mg total) by mouth daily as needed. 01/31/18 01/31/19  Myrna Blazer, MD  gabapentin (NEURONTIN) 300 MG capsule Take 300 mg by mouth 2 (two) times a week.    [provider]  lidocaine (LIDODERM) 5 % Place 1 patch onto the skin daily. Remove & Discard patch within 12 hours or as directed by MD 01/07/19   Enid Derry, PA-C  magnesium citrate SOLN Take 296 mLs (1 Bottle total) by mouth once as needed for up to 1 dose for severe constipation. 01/31/18   Schaevitz, Myra Rudeavid Matthew, MD  meloxicam (MOBIC) 15 MG tablet Take 1 tablet (15 mg total) by mouth daily. 10/30/18   Joni ReiningSmith, Ronald K, PA-C  omeprazole (PRILOSEC) 40 MG capsule Take 1 capsule (40 mg total) by mouth daily. 01/31/18 01/31/19  Myrna BlazerSchaevitz, David Matthew, MD  oxybutynin (DITROPAN-XL) 10 MG 24 hr tablet Take 10 mg by mouth at bedtime.    [provider]  predniSONE (DELTASONE) 50 MG tablet Take 1 tablet (50 mg total) by mouth daily with breakfast. 12/13/18   Cuthriell, Delorise RoyalsJonathan D, PA-C  propranolol (INDERAL) 20 MG  tablet Take 10 mg by mouth 2 (two) times daily.    [provider]  tiZANidine (ZANAFLEX) 4 MG tablet Take 4 mg by mouth 2 (two) times daily.    [provider]  traMADol (ULTRAM) 50 MG tablet Take 1 tablet (50 mg total) by mouth every 6 (six) hours as needed. 12/13/18   Cuthriell, Delorise RoyalsJonathan D, PA-C  traZODone (DESYREL) 100 MG tablet Take 150-300 mg by mouth at bedtime.    [provider]    Allergies Penicillins  Family History  Problem Relation Age of Onset  . Alcohol abuse Mother   . Drug abuse Mother   . HIV Mother   . Bipolar disorder Mother   . Cirrhosis Mother   . Cancer Father   . Heart disease Father   . Drug abuse Sister   . Alcohol abuse Sister     Social History Social History   Tobacco Use  . Smoking status: Never Smoker  . Smokeless tobacco: Never Used  Substance Use Topics  . Alcohol use: No    Alcohol/week: 0.0 standard drinks  . Drug use: No     Review of Systems  Constitutional: No fever/chills Cardiovascular: No chest pain. Respiratory: No SOB. Gastrointestinal: No abdominal pain.  No nausea, no vomiting.  Genitourinary: Negative for dysuria. Musculoskeletal: Positive for back pain.  Skin: Negative for rash, abrasions, lacerations, ecchymosis. Neurological: Negative for headaches, numbness or tingling   ____________________________________________   PHYSICAL EXAM:  VITAL SIGNS: ED Triage Vitals  Enc Vitals Group     BP 01/07/19 0858 137/72     Pulse Rate 01/07/19 0858 87     Resp 01/07/19 0858 15     Temp 01/07/19 0858 98.6 F (37 C)     Temp Source 01/07/19 0858 Oral     SpO2 01/07/19 0858 100 %     Weight 01/07/19 0859 214 lb (97.1 kg)     Height 01/07/19 0859 5\' 4"  (1.626 m)     Head Circumference --      Peak Flow --      Pain Score 01/07/19 0858 9     Pain Loc --      Pain Edu? --      Excl. in GC? --      Constitutional: Alert and oriented. Well appearing and in no acute distress. Eyes:  Conjunctivae are normal. PERRL. EOMI. Head: Atraumatic. ENT:      Ears:      Nose: No congestion/rhinnorhea.      Mouth/Throat: Mucous membranes are moist.  Neck: No stridor.  Cardiovascular: Normal rate, regular rhythm.  Good peripheral circulation. Respiratory: Normal respiratory effort without tachypnea or retractions. Lungs CTAB. Good air entry to the bases with no decreased or absent breath sounds. Gastrointestinal: Bowel sounds  4 quadrants. Soft and nontender to palpation. No guarding or rigidity. No palpable masses. No distention.  Musculoskeletal: Full range of motion to all extremities. No gross deformities appreciated.  Tenderness to palpation to right sciatic notch.  Positive straight leg raise.  Strength equal in lower extremities bilaterally.  Normal gait. Neurologic:  Normal speech and language. No gross focal neurologic deficits are appreciated.  Skin:  Skin is warm, dry and intact. No rash noted. Psychiatric: Mood and affect are normal. Speech and behavior are normal. Patient exhibits appropriate insight and judgement.   ____________________________________________   LABS (all labs ordered are listed, but only abnormal results are displayed)  Labs Reviewed - No data to display ____________________________________________  EKG   ____________________________________________  RADIOLOGY   No results found.  ____________________________________________    PROCEDURES  Procedure(s) performed:    Procedures    Medications  orphenadrine (NORFLEX) injection 60 mg (60 mg Intramuscular Given 01/07/19 1038)  lidocaine (LIDODERM) 5 % 1 patch (1 patch Transdermal Patch Applied 01/07/19 1038)  oxyCODONE-acetaminophen (PERCOCET/ROXICET) 5-325 MG per tablet 1 tablet (1 tablet Oral Given 01/07/19 1038)     ____________________________________________   INITIAL IMPRESSION / ASSESSMENT AND PLAN / ED COURSE  Pertinent labs & imaging results that were available during  my care of the patient were reviewed by me and considered in my medical decision making (see chart for details).  Review of the [place] CSRS was performed in accordance of the [facility] prior to dispensing any controlled drugs.   Patient's diagnosis is consistent with sciatica.  Vital signs and exam are reassuring.  Patient had lumbar x-ray completed on Friday and hip x-ray completed in November and findings were discussed with the patient.  Symptoms have not changed in character today.  She has an appointment with surgery in 6 days and was encouraged highly to make this appointment.  She was given a shot of Norflex, a dose of Percocet and a Lidoderm patch for pain in the emergency department.  She will continue taking her outpatient prescriptions.  Patient will be discharged home with prescriptions for Lidoderm patch. Patient is to follow up with primary care and surgery as directed. Patient is given ED precautions to return to the ED for any worsening or new symptoms.     ____________________________________________  FINAL CLINICAL IMPRESSION(S) / ED DIAGNOSES  Final diagnoses:  Sciatica of right side      NEW MEDICATIONS STARTED DURING THIS VISIT:  ED Discharge Orders         Ordered    lidocaine (LIDODERM) 5 %  Every 24 hours     01/07/19 1041              This chart was dictated using voice recognition software/Dragon. Despite best efforts to proofread, errors can occur which can change the meaning. Any change was purely unintentional.    Enid Derry, PA-C 01/07/19 1258    Emily Filbert, MD 01/07/19 1355

## 2019-06-20 ENCOUNTER — Other Ambulatory Visit: Payer: Self-pay

## 2019-06-20 DIAGNOSIS — Z79899 Other long term (current) drug therapy: Secondary | ICD-10-CM | POA: Insufficient documentation

## 2019-06-20 DIAGNOSIS — K5904 Chronic idiopathic constipation: Secondary | ICD-10-CM | POA: Insufficient documentation

## 2019-06-20 LAB — COMPREHENSIVE METABOLIC PANEL
ALT: 18 U/L (ref 0–44)
AST: 17 U/L (ref 15–41)
Albumin: 3.9 g/dL (ref 3.5–5.0)
Alkaline Phosphatase: 59 U/L (ref 38–126)
Anion gap: 8 (ref 5–15)
BUN: 12 mg/dL (ref 6–20)
CO2: 25 mmol/L (ref 22–32)
Calcium: 9.8 mg/dL (ref 8.9–10.3)
Chloride: 106 mmol/L (ref 98–111)
Creatinine, Ser: 0.76 mg/dL (ref 0.44–1.00)
GFR calc Af Amer: >60 mL/min (ref >60)
GFR calc non Af Amer: >60 mL/min (ref >60)
Glucose, Bld: 119 mg/dL — ABNORMAL HIGH (ref 70–99)
Potassium: 3.7 mmol/L (ref 3.5–5.1)
Sodium: 139 mmol/L (ref 135–145)
Total Bilirubin: 0.3 mg/dL (ref 0.3–1.2)
Total Protein: 7.3 g/dL (ref 6.5–8.1)

## 2019-06-20 LAB — CBC
HCT: 35.2 % — ABNORMAL LOW (ref 36.0–46.0)
Hemoglobin: 11.5 g/dL — ABNORMAL LOW (ref 12.0–15.0)
MCH: 29 pg (ref 26.0–34.0)
MCHC: 32.7 g/dL (ref 30.0–36.0)
MCV: 88.9 fL (ref 80.0–100.0)
Platelets: 387 10*3/uL (ref 150–400)
RBC: 3.96 MIL/uL (ref 3.87–5.11)
RDW: 14.8 % (ref 11.5–15.5)
WBC: 7.4 10*3/uL (ref 4.0–10.5)
nRBC: 0 % (ref 0.0–0.2)

## 2019-06-20 LAB — URINALYSIS, COMPLETE (UACMP) WITH MICROSCOPIC
Bacteria, UA: NONE SEEN
Bilirubin Urine: NEGATIVE
Glucose, UA: NEGATIVE mg/dL
Ketones, ur: NEGATIVE mg/dL
Leukocytes,Ua: NEGATIVE
Nitrite: NEGATIVE
Protein, ur: NEGATIVE mg/dL
Specific Gravity, Urine: 1.021 (ref 1.005–1.030)
pH: 6 (ref 5.0–8.0)

## 2019-06-20 LAB — POCT PREGNANCY, URINE: Preg Test, Ur: NEGATIVE

## 2019-06-20 NOTE — ED Triage Notes (Signed)
Pt states nausea and lower abd pain radiating "to my booty" for one weeks. Last bm a week ago which pt states is typical for her. Pt appears in no acute distress. Pt states was diagnosed with UTI two weeks ago, took all antibiotics, "but I still feel like i'm not emptying my bladder".

## 2019-06-21 ENCOUNTER — Emergency Department: Payer: Self-pay

## 2019-06-21 ENCOUNTER — Emergency Department
Admission: EM | Admit: 2019-06-21 | Discharge: 2019-06-21 | Disposition: A | Discharge status: Home / Self Care | Payer: Self-pay | Attending: Emergency Medicine | Admitting: Emergency Medicine

## 2019-06-21 ENCOUNTER — Other Ambulatory Visit: Payer: Self-pay

## 2019-06-21 DIAGNOSIS — K5904 Chronic idiopathic constipation: Secondary | ICD-10-CM

## 2019-06-21 MED ORDER — ONDANSETRON 4 MG PO TBDP
4.0000 mg | ORAL_TABLET | Freq: Once | ORAL | Status: AC | PRN
  Administered 2019-06-21: 4 mg via ORAL
  Filled 2019-06-21: qty 1

## 2019-06-21 MED ORDER — POLYETHYLENE GLYCOL 3350 17 GM/SCOOP PO POWD: 1.0000 | Freq: Once | ORAL | 0 refills | Status: AC

## 2019-06-21 MED ORDER — DOCUSATE SODIUM 100 MG PO CAPS
100.0000 mg | ORAL_CAPSULE | Freq: Once | ORAL | Status: AC
  Administered 2019-06-21: 100 mg via ORAL
  Filled 2019-06-21: qty 1

## 2019-06-21 MED ORDER — DOCUSATE SODIUM 100 MG PO CAPS: 100.0000 mg | ORAL_CAPSULE | Freq: Two times a day (BID) | ORAL | 2 refills | Status: DC

## 2019-06-21 NOTE — ED Notes (Signed)
Pt updated on wait time. Pt verbalizes understanding. Pt requesting nausea medication.pt informed will obtain order for same.

## 2019-06-21 NOTE — ED Notes (Signed)
See triage note  Presents with lower abd pain  States apin started about 1 week ago  Denies any fever or n/v

## 2019-06-21 NOTE — ED Provider Notes (Signed)
[facility] Emergency Department Provider Note   ____________________________________________    I have reviewed the triage vital signs and the nursing notes.   HISTORY  Chief Complaint Abdominal Pain     HPI Sierra Wiley is a 48 y.o. female who presents with complaints of abdominal pain and bloating.  Patient reports this is been happening since she was 48 years old.  She reports after eating she frequently develops lower abdominal cramping and bloating.  She notes this is still occurring and she is frustrated.  She has never had a significant work-up for this done.  Currently feels well has no complaints.  No fevers or chills nausea or vomiting.  She does note frequent hard stools.  Has not take anything for this  Past Medical History:  Diagnosis Date  . Asthma   . Bipolar 1 disorder (HCC)   . Chronic back pain   . Depression   . Opiate use 02/21/2016  . PTSD (post-traumatic stress disorder)   . Seizures [facility](HCC)     Patient Active Problem List   Diagnosis Date Noted  . 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 Bipolar 1 disorder, depressed (HCC) 09/05/2016  . Chronic pain 02/21/2016  . Asthma 11/06/2015  . Post traumatic stress disorder 07/01/2015    Past Surgical History:  Procedure Laterality Date  . CHOLECYSTECTOMY    . ELBOW SURGERY Left 2000  . TUBAL LIGATION      Prior to Admission medications   Medication Sig Start Date End Date Taking? Authorizing Provider  albuterol (PROVENTIL HFA;VENTOLIN HFA) 108 (90 Base) MCG/ACT inhaler Inhale 2 puffs into the lungs every 6 (six) hours as needed for wheezing or shortness of breath.    [provider]  beclomethasone (QVAR) 80 MCG/ACT inhaler Inhale 2 puffs into the lungs 2 (two) times daily.    [provider]  clonazePAM (KLONOPIN) 0.5 MG tablet Take 1 tablet by mouth 2 (two) times daily. As needed    [provider]  docusate sodium (COLACE) 100  MG capsule Take 1 capsule (100 mg total) by mouth 2 (two) times daily. 06/21/19 06/20/20  Jene EveryKinner, Venita Seng, MD  gabapentin (NEURONTIN) 300 MG capsule Take 300 mg by mouth 2 (two) times a week.    [provider]  lidocaine (LIDODERM) 5 % Place 1 patch onto the skin daily. Remove & Discard patch within 12 hours or as directed by MD 01/07/19   Enid DerryWagner, Ashley, PA-C  magnesium citrate SOLN Take 296 mLs (1 Bottle total) by mouth once as needed for up to 1 dose for severe constipation. 01/31/18   Schaevitz, Myra Rudeavid Matthew, MD  meloxicam (MOBIC) 15 MG tablet Take 1 tablet (15 mg total) by mouth daily. 10/30/18   Joni ReiningSmith, Ronald K, PA-C  omeprazole (PRILOSEC) 40 MG capsule Take 1 capsule (40 mg total) by mouth daily. 01/31/18 01/31/19  Myrna BlazerSchaevitz, David Matthew, MD  oxybutynin (DITROPAN-XL) 10 MG 24 hr tablet Take 10 mg by mouth at bedtime.    [provider]  polyethylene glycol powder (MIRALAX) 17 GM/SCOOP powder Take 255 g by mouth once for 1 dose. 06/21/19 06/21/19  Jene EveryKinner, Editha Bridgeforth, MD  predniSONE (DELTASONE) 50 MG tablet Take 1 tablet (50 mg total) by mouth daily with breakfast. 12/13/18   Cuthriell, Delorise RoyalsJonathan D, PA-C  propranolol (INDERAL) 20 MG tablet Take 10 mg by mouth 2 (two) times daily.    [provider]  tiZANidine (ZANAFLEX) 4 MG tablet Take 4 mg by mouth  2 (two) times daily.    [provider]  traMADol (ULTRAM) 50 MG tablet Take 1 tablet (50 mg total) by mouth every 6 (six) hours as needed. 12/13/18   Cuthriell, Delorise RoyalsJonathan D, PA-C  traZODone (DESYREL) 100 MG tablet Take 150-300 mg by mouth at bedtime.    [provider]     Allergies Penicillins  Family History  Problem Relation Age of Onset  . Alcohol abuse Mother   . Drug abuse Mother   . HIV Mother   . Bipolar disorder Mother   . Cirrhosis Mother   . Cancer Father   . Heart disease Father   . Drug abuse Sister   . Alcohol abuse Sister     Social History Social History   Tobacco Use  . Smoking  status: Never Smoker  . Smokeless tobacco: Never Used  Substance Use Topics  . Alcohol use: No    Alcohol/week: 0.0 standard drinks  . Drug use: No    Review of Systems  Constitutional: No fever/chills Eyes: No visual changes.  ENT: No sore throat. Cardiovascular: Denies chest pain. Respiratory: Denies shortness of breath. Gastrointestinal: As above Genitourinary: Negative for dysuria. Musculoskeletal: Negative for back pain. Skin: Negative for rash. Neurological: Negative for headaches    ____________________________________________   PHYSICAL EXAM:  VITAL SIGNS: ED Triage Vitals  Enc Vitals Group     BP 06/20/19 2146 116/83     Pulse Rate 06/20/19 2146 95     Resp 06/20/19 2146 16     Temp 06/20/19 2146 99.3 F (37.4 C)     Temp Source 06/20/19 2146 Oral     SpO2 06/20/19 2146 95 %     Weight 06/20/19 2147 103.4 kg (228 lb)     Height 06/20/19 2147 1.6 m (5\' 3" )     Head Circumference --      Peak Flow --      Pain Score 06/20/19 2147 10     Pain Loc --      Pain Edu? --      Excl. in GC? --     Constitutional: Alert and oriented. No acute distress. Pleasant and interactive  Nose: No congestion/rhinnorhea. Mouth/Throat: Mucous membranes are moist.    Cardiovascular: Normal rate, regular rhythm.  Good peripheral circulation. Respiratory: Normal respiratory effort.  No retractions.  Gastrointestinal: Soft and nontender. No distention.  No CVA tenderness.  Musculoskeletal: No lower extremity tenderness nor edema.  Warm and well perfused Neurologic:  Normal speech and language. No gross focal neurologic deficits are appreciated.  Skin:  Skin is warm, dry and intact. No rash noted. Psychiatric: Mood and affect are normal.   ____________________________________________   LABS (all labs ordered are listed, but only abnormal results are displayed)  Labs Reviewed  COMPREHENSIVE METABOLIC PANEL - Abnormal; Notable for the following components:      Result  Value   Glucose, Bld 119 (*)    All other components within normal limits  CBC - Abnormal; Notable for the following components:   Hemoglobin 11.5 (*)    HCT 35.2 (*)    All other components within normal limits  URINALYSIS, COMPLETE (UACMP) WITH MICROSCOPIC - Abnormal; Notable for the following components:   Color, Urine YELLOW (*)    APPearance CLEAR (*)    Hgb urine dipstick SMALL (*)    All other components within normal limits  POC URINE PREG, ED  POCT PREGNANCY, URINE   ____________________________________________  EKG  None ____________________________________________  RADIOLOGY  KUB demonstrates moderate stool volume no other abnormalities ____________________________________________   PROCEDURES  Procedure(s) performed: No  Procedures   Critical Care performed: No ____________________________________________   INITIAL IMPRESSION / ASSESSMENT AND PLAN / ED COURSE  Pertinent labs & imaging results that were available during my care of the patient were reviewed by me and considered in my medical decision making (see chart for details).  Patient well-appearing and in no acute distress, reassuring abdominal exam.  Lab work is unremarkable.  She reports that she always has hemoglobin in her urine this is been evaluated before.  Suspect either chronic constipation or possible food allergy as the cause of her symptoms, recommend keeping a food diary and starting on stool softeners and laxatives daily, will refer to gastroenterology as well.    ____________________________________________   FINAL CLINICAL IMPRESSION(S) / ED DIAGNOSES  Final diagnoses:  Chronic idiopathic constipation        Note:  This document was prepared using Dragon voice recognition software and may include unintentional dictation errors.   Lavonia Drafts, MD 06/21/19 [phone]

## 2019-08-16 ENCOUNTER — Emergency Department
Admission: EM | Admit: 2019-08-16 | Discharge: 2019-08-16 | Disposition: A | Discharge status: Home / Self Care | Payer: Medicaid Other | Attending: Emergency Medicine | Admitting: Emergency Medicine

## 2019-08-16 ENCOUNTER — Other Ambulatory Visit: Payer: Self-pay

## 2019-08-16 ENCOUNTER — Encounter: Payer: Self-pay | Admitting: Emergency Medicine

## 2019-08-16 DIAGNOSIS — R42 Dizziness and giddiness: Secondary | ICD-10-CM | POA: Insufficient documentation

## 2019-08-16 DIAGNOSIS — Z791 Long term (current) use of non-steroidal anti-inflammatories (NSAID): Secondary | ICD-10-CM | POA: Insufficient documentation

## 2019-08-16 DIAGNOSIS — Z79899 Other long term (current) drug therapy: Secondary | ICD-10-CM | POA: Insufficient documentation

## 2019-08-16 DIAGNOSIS — J45909 Unspecified asthma, uncomplicated: Secondary | ICD-10-CM | POA: Insufficient documentation

## 2019-08-16 LAB — URINALYSIS, COMPLETE (UACMP) WITH MICROSCOPIC
Bilirubin Urine: NEGATIVE
Glucose, UA: NEGATIVE mg/dL
Ketones, ur: NEGATIVE mg/dL
Leukocytes,Ua: NEGATIVE
Nitrite: NEGATIVE
Protein, ur: NEGATIVE mg/dL
Specific Gravity, Urine: 1.009 (ref 1.005–1.030)
pH: 5 (ref 5.0–8.0)

## 2019-08-16 LAB — BASIC METABOLIC PANEL
Anion gap: 7 (ref 5–15)
BUN: 13 mg/dL (ref 6–20)
CO2: 24 mmol/L (ref 22–32)
Calcium: 9.5 mg/dL (ref 8.9–10.3)
Chloride: 103 mmol/L (ref 98–111)
Creatinine, Ser: 0.85 mg/dL (ref 0.44–1.00)
GFR calc Af Amer: >60 mL/min (ref >60)
GFR calc non Af Amer: >60 mL/min (ref >60)
Glucose, Bld: 87 mg/dL (ref 70–99)
Potassium: 3.7 mmol/L (ref 3.5–5.1)
Sodium: 134 mmol/L — ABNORMAL LOW (ref 135–145)

## 2019-08-16 LAB — CBC
HCT: 36.5 % (ref 36.0–46.0)
Hemoglobin: 12.1 g/dL (ref 12.0–15.0)
MCH: 29.4 pg (ref 26.0–34.0)
MCHC: 33.2 g/dL (ref 30.0–36.0)
MCV: 88.8 fL (ref 80.0–100.0)
Platelets: 356 10*3/uL (ref 150–400)
RBC: 4.11 MIL/uL (ref 3.87–5.11)
RDW: 14.2 % (ref 11.5–15.5)
WBC: 7.8 10*3/uL (ref 4.0–10.5)
nRBC: 0 % (ref 0.0–0.2)

## 2019-08-16 LAB — POCT PREGNANCY, URINE: Preg Test, Ur: NEGATIVE

## 2019-08-16 MED ORDER — TRAMADOL HCL 50 MG PO TABS: 50.0000 mg | ORAL_TABLET | Freq: Four times a day (QID) | ORAL | 0 refills | Status: DC | PRN

## 2019-08-16 MED ORDER — TRAMADOL HCL 50 MG PO TABS
50.0000 mg | ORAL_TABLET | Freq: Once | ORAL | Status: AC
  Administered 2019-08-16: 50 mg via ORAL
  Filled 2019-08-16: qty 1

## 2019-08-16 NOTE — ED Triage Notes (Signed)
Patient presents to the ED with increased weakness/dizziness for approx. 1.5 weeks.  Patient told her PCP and was seen on Thursday and had blood work drawn.  Patient was told she had a vitamin D deficiency and vitamin B deficiency.  Patient states today she continues to feel very dizzy when she walks or bends down.

## 2019-08-16 NOTE — ED Notes (Signed)
PT reporting her ride is outside at this time and she would like to leave ED. MD made aware and discharge papers printed.

## 2019-08-16 NOTE — ED Notes (Signed)
Pt called with no repsonse

## 2019-08-16 NOTE — ED Provider Notes (Signed)
[facility] Emergency Department Provider Note   ____________________________________________    I have reviewed the triage vital signs and the nursing notes.   HISTORY  Chief Complaint Weakness     HPI Sierra Wiley is a 48 y.o. female who presents with complaints of intermittent dizziness over the last several weeks.  She reports she recently saw her PCP who diagnosed her with B12 deficiency and vitamin D deficiency.  Apparently there is a plan for repletion of these although the patient is not entirely sure.  Came to the emergency department today for evaluation of continued dizziness.  Denies headache or neuro deficits.  Reports intermittent "wooziness ".  No chest pain no shortness of breath.  Currently is feeling well  Past Medical History:  Diagnosis Date   Asthma    Bipolar 1 disorder (HCC)    Chronic back pain    Depression    Opiate use 02/21/2016   PTSD (post-traumatic stress disorder)    Seizures [facility](HCC)     Patient Active Problem List   Diagnosis Date Noted   Tremor due to drug withdrawal (HCC) 10/07/2016   Bipolar 1 disorder, depressed (HCC) 09/05/2016   Chronic pain 02/21/2016   Asthma 11/06/2015   Post traumatic stress disorder 07/01/2015    Past Surgical History:  Procedure Laterality Date   CHOLECYSTECTOMY     ELBOW SURGERY Left 2000   TUBAL LIGATION      Prior to Admission medications   Medication Sig Start Date End Date Taking? Authorizing Provider  albuterol (PROVENTIL HFA;VENTOLIN HFA) 108 (90 Base) MCG/ACT inhaler Inhale 2 puffs into the lungs every 6 (six) hours as needed for wheezing or shortness of breath.    [provider]  beclomethasone (QVAR) 80 MCG/ACT inhaler Inhale 2 puffs into the lungs 2 (two) times daily.    [provider]  clonazePAM (KLONOPIN) 0.5 MG tablet Take 1 tablet by mouth 2 (two) times daily. As needed    [provider]  docusate sodium  (COLACE) 100 MG capsule Take 1 capsule (100 mg total) by mouth 2 (two) times daily. 06/21/19 06/20/20  Jene EveryKinner, Farida Mcreynolds, MD  gabapentin (NEURONTIN) 300 MG capsule Take 300 mg by mouth 2 (two) times a week.    [provider]  lidocaine (LIDODERM) 5 % Place 1 patch onto the skin daily. Remove & Discard patch within 12 hours or as directed by MD 01/07/19   Enid DerryWagner, Ashley, PA-C  magnesium citrate SOLN Take 296 mLs (1 Bottle total) by mouth once as needed for up to 1 dose for severe constipation. 01/31/18   Schaevitz, Myra Rudeavid Matthew, MD  meloxicam (MOBIC) 15 MG tablet Take 1 tablet (15 mg total) by mouth daily. 10/30/18   Joni ReiningSmith, Ronald K, PA-C  omeprazole (PRILOSEC) 40 MG capsule Take 1 capsule (40 mg total) by mouth daily. 01/31/18 01/31/19  Myrna BlazerSchaevitz, David Matthew, MD  oxybutynin (DITROPAN-XL) 10 MG 24 hr tablet Take 10 mg by mouth at bedtime.    [provider]  predniSONE (DELTASONE) 50 MG tablet Take 1 tablet (50 mg total) by mouth daily with breakfast. 12/13/18   Cuthriell, Delorise RoyalsJonathan D, PA-C  propranolol (INDERAL) 20 MG tablet Take 10 mg by mouth 2 (two) times daily.    [provider]  tiZANidine (ZANAFLEX) 4 MG tablet Take 4 mg by mouth 2 (two) times daily.    [provider]  traMADol (ULTRAM) 50 MG tablet Take 1 tablet (50 mg total) by mouth every 6 (six)  hours as needed. 08/16/19 08/15/20  Jene EveryKinner, Dorsie Burich, MD  traZODone (DESYREL) 100 MG tablet Take 150-300 mg by mouth at bedtime.    [provider]     Allergies Penicillins  Family History  Problem Relation Age of Onset   Alcohol abuse Mother    Drug abuse Mother    HIV Mother    Bipolar disorder Mother    Cirrhosis Mother    Cancer Father    Heart disease Father    Drug abuse Sister    Alcohol abuse Sister     Social History Social History   Tobacco Use   Smoking status: Never Smoker   Smokeless tobacco: Never Used  Substance Use Topics   Alcohol use: No    Alcohol/week: 0.0  standard drinks   Drug use: No    Review of Systems  Constitutional: No fever/chills Eyes: No visual changes.  ENT: No sore throat. Cardiovascular: Denies chest pain. Respiratory: Denies shortness of breath. Gastrointestinal: No abdominal pain.   Genitourinary: Negative for dysuria. Musculoskeletal: Chronic back pain Skin: Negative for rash. Neurological: Negative for headaches or weakness   ____________________________________________   PHYSICAL EXAM:  VITAL SIGNS: ED Triage Vitals  Enc Vitals Group     BP 08/16/19 1631 128/72     Pulse Rate 08/16/19 1631 81     Resp 08/16/19 1631 16     Temp 08/16/19 1631 99.1 F (37.3 C)     Temp Source 08/16/19 1631 Oral     SpO2 08/16/19 1631 99 %     Weight --      Height --      Head Circumference --      Peak Flow --      Pain Score 08/16/19 1632 7     Pain Loc --      Pain Edu? --      Excl. in [place]? --     Constitutional: Alert and oriented.  Eyes: Conjunctivae are normal.  No nystagmus, EOMI Head: Atraumatic. Nose: No congestion/rhinnorhea. Mouth/Throat: Mucous membranes are moist.    Cardiovascular: Normal rate, regular rhythm. Grossly normal heart sounds.  Good peripheral circulation. Respiratory: Normal respiratory effort.  No retractions. Lungs CTAB. Gastrointestinal: Soft and nontender. No distention.  No CVA tenderness.  Musculoskeletal:   Warm and well perfused Neurologic:  Normal speech and language. No gross focal neurologic deficits are appreciated.  Cranial nerves II to XII are normal, normal ambulation Skin:  Skin is warm, dry and intact. No rash noted. Psychiatric: Mood and affect are normal. Speech and behavior are normal.  ____________________________________________   LABS (all labs ordered are listed, but only abnormal results are displayed)  Labs Reviewed  BASIC METABOLIC PANEL - Abnormal; Notable for the following components:      Result Value   Sodium 134 (*)    All other components  within normal limits  URINALYSIS, COMPLETE (UACMP) WITH MICROSCOPIC - Abnormal; Notable for the following components:   Color, Urine STRAW (*)    APPearance CLEAR (*)    Hgb urine dipstick MODERATE (*)    All other components within normal limits  CBC  POC URINE PREG, ED  POCT PREGNANCY, URINE   ____________________________________________  EKG  ED ECG REPORT I, Jene Everyobert Lorie Cleckley, the attending physician, personally viewed and interpreted this ECG.  Date: 08/16/2019  Rhythm: normal sinus rhythm QRS Axis: normal Intervals: Prolonged QT ST/T Wave abnormalities: normal Narrative Interpretation: no evidence of acute ischemia  ____________________________________________  RADIOLOGY  None ____________________________________________  PROCEDURES  Procedure(s) performed: No  Procedures   Critical Care performed: No ____________________________________________   INITIAL IMPRESSION / ASSESSMENT AND PLAN / ED COURSE  Pertinent labs & imaging results that were available during my care of the patient were reviewed by me and considered in my medical decision making (see chart for details).  Patient well-appearing and in no acute distress, she appears to be having appropriate outpatient work-up, unclear as to the exact nature of why she presented today however she reports she is overall feeling well.  Lab work is quite reassuring here.  She is ambulating well without difficulty.  Offered IV hydration but she declined as she is afraid of needles.  Recommended increased p.o. hydration, will Rx medication for chronic back pain.  Close follow-up with PCP for continued work-up    ____________________________________________   FINAL CLINICAL IMPRESSION(S) / ED DIAGNOSES  Final diagnoses:  Dizziness        Note:  This document was prepared using Dragon voice recognition software and may include unintentional dictation errors.   Lavonia Drafts, MD 08/16/19 2124

## 2019-09-22 ENCOUNTER — Encounter: Payer: Self-pay | Admitting: Emergency Medicine

## 2019-09-22 ENCOUNTER — Other Ambulatory Visit: Payer: Self-pay

## 2019-09-22 ENCOUNTER — Emergency Department
Admission: EM | Admit: 2019-09-22 | Discharge: 2019-09-22 | Disposition: A | Discharge status: Home / Self Care | Payer: Medicaid Other | Attending: Emergency Medicine | Admitting: Emergency Medicine

## 2019-09-22 DIAGNOSIS — M5441 Lumbago with sciatica, right side: Secondary | ICD-10-CM | POA: Insufficient documentation

## 2019-09-22 DIAGNOSIS — M545 Low back pain: Secondary | ICD-10-CM | POA: Diagnosis present

## 2019-09-22 DIAGNOSIS — J45909 Unspecified asthma, uncomplicated: Secondary | ICD-10-CM | POA: Diagnosis not present

## 2019-09-22 DIAGNOSIS — M25551 Pain in right hip: Secondary | ICD-10-CM | POA: Diagnosis not present

## 2019-09-22 DIAGNOSIS — G8929 Other chronic pain: Secondary | ICD-10-CM | POA: Diagnosis not present

## 2019-09-22 DIAGNOSIS — Z79899 Other long term (current) drug therapy: Secondary | ICD-10-CM | POA: Diagnosis not present

## 2019-09-22 MED ORDER — LIDOCAINE 5 % EX PTCH
1.0000 | MEDICATED_PATCH | CUTANEOUS | Status: DC
  Administered 2019-09-22: 13:00:00 1 via TRANSDERMAL
  Filled 2019-09-22: qty 1

## 2019-09-22 MED ORDER — TRAMADOL HCL 50 MG PO TABS: 50.0000 mg | ORAL_TABLET | Freq: Four times a day (QID) | ORAL | 0 refills | Status: DC | PRN

## 2019-09-22 MED ORDER — LIDOCAINE 5 % EX PTCH: 1.0000 | MEDICATED_PATCH | Freq: Two times a day (BID) | CUTANEOUS | 0 refills | Status: DC

## 2019-09-22 MED ORDER — TRAMADOL HCL 50 MG PO TABS
50.0000 mg | ORAL_TABLET | Freq: Once | ORAL | Status: AC
  Administered 2019-09-22: 13:00:00 50 mg via ORAL
  Filled 2019-09-22: qty 1

## 2019-09-22 NOTE — Discharge Instructions (Signed)
Follow-up with your primary care provider if any continued problems with your back or hip.  Begin taking your gabapentin 300 mg 1 daily for 3 days and then increase to 1 tablet twice a day.  Call your primary care provider for additional medication.  A prescription for Lidoderm patches was sent to the pharmacy.  This is the same patch that was placed on you in the emergency department.  These patches can be replaced every 12 hours to help with pain.  Continue your regular medication.  You may use ice or heat to your back and hip as needed for discomfort.  Also a small amount of tramadol was sent to your pharmacy for the next 2 days if needed for moderate pain.

## 2019-09-22 NOTE — ED Triage Notes (Addendum)
Pt in via [facility] from home with right hip pain x 4 days, reports known bulging disc at L4-L5.  Denies any recent injury.  NAD noted at this time.

## 2019-09-22 NOTE — ED Provider Notes (Signed)
[facility] Emergency Department Provider Note  ____________________________________________   First MD Initiated Contact with Patient 09/22/19 1142     (approximate)  I have reviewed the triage vital signs and the nursing notes.   HISTORY  Chief Complaint Back Pain   HPI Sierra Wiley is a 48 y.o. female presents to the ED via EMS with complaint of low back pain radiating into her right hip.  Patient states that she has a history of osteoarthritis and sciatica.  She denies any recent injury to her back and states that this is similar to the same pain that she is experienced.  Range of motion is decreased secondary to pain.  Patient has gabapentin 300 mg with instructions to take 1 twice a week.  Patient has been taking them on a as needed basis because she thought these were "just for pain".  She rates her pain as an 8 out of 10.     Past Medical History:  Diagnosis Date  . Asthma   . Bipolar 1 disorder (HCC)   . Chronic back pain   . Depression   . Opiate use 02/21/2016  . PTSD (post-traumatic stress disorder)   . Seizures [facility])     Patient Active Problem List   Diagnosis Date Noted  . 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 Bipolar 1 disorder, depressed (HCC) 09/05/2016  . Chronic pain 02/21/2016  . Asthma 11/06/2015  . Post traumatic stress disorder 07/01/2015    Past Surgical History:  Procedure Laterality Date  . CHOLECYSTECTOMY    . ELBOW SURGERY Left 2000  . TUBAL LIGATION      Prior to Admission medications   Medication Sig Start Date End Date Taking? Authorizing Provider  albuterol (PROVENTIL HFA;VENTOLIN HFA) 108 (90 Base) MCG/ACT inhaler Inhale 2 puffs into the lungs every 6 (six) hours as needed for wheezing or shortness of breath.    [provider]  beclomethasone (QVAR) 80 MCG/ACT inhaler Inhale 2 puffs into the lungs 2 (two) times daily.    [provider]  clonazePAM (KLONOPIN) 0.5 MG  tablet Take 1 tablet by mouth 2 (two) times daily. As needed    [provider]  gabapentin (NEURONTIN) 300 MG capsule Take 300 mg by mouth 2 (two) times a week.    [provider]  lidocaine (LIDODERM) 5 % Place 1 patch onto the skin every 12 (twelve) hours. Remove & Discard patch within 12 hours or as directed by MD 09/22/19 09/21/20  Tommi Rumps, PA-C  omeprazole (PRILOSEC) 40 MG capsule Take 1 capsule (40 mg total) by mouth daily. 01/31/18 01/31/19  Myrna Blazer, MD  oxybutynin (DITROPAN-XL) 10 MG 24 hr tablet Take 10 mg by mouth at bedtime.    [provider]  propranolol (INDERAL) 20 MG tablet Take 10 mg by mouth 2 (two) times daily.    [provider]  tiZANidine (ZANAFLEX) 4 MG tablet Take 4 mg by mouth 2 (two) times daily.    [provider]  traMADol (ULTRAM) 50 MG tablet Take 1 tablet (50 mg total) by mouth every 6 (six) hours as needed for moderate pain. 09/22/19   Tommi Rumps, PA-C  traZODone (DESYREL) 100 MG tablet Take 150-300 mg by mouth at bedtime.    [provider]    Allergies Penicillins  Family History  Problem Relation Age of Onset  . Alcohol abuse Mother   . Drug abuse Mother   . HIV Mother   .  Bipolar disorder Mother   . Cirrhosis Mother   . Cancer Father   . Heart disease Father   . Drug abuse Sister   . Alcohol abuse Sister     Social History Social History   Tobacco Use  . Smoking status: Never Smoker  . Smokeless tobacco: Never Used  Substance Use Topics  . Alcohol use: No    Alcohol/week: 0.0 standard drinks  . Drug use: No    Review of Systems Constitutional: No fever/chills Cardiovascular: Denies chest pain. Respiratory: Denies shortness of breath. Gastrointestinal: No abdominal pain.  No nausea, no vomiting.  No diarrhea.  No constipation. Genitourinary: Negative for dysuria. Musculoskeletal: Positive for chronic low back pain.  Positive sciatica right-sided. Skin:  Negative for rash. Neurological: Negative for  focal weakness or numbness. ____________________________________________   PHYSICAL EXAM:  VITAL SIGNS: ED Triage Vitals  Enc Vitals Group     BP 09/22/19 1132 129/69     Pulse Rate 09/22/19 1132 72     Resp 09/22/19 1132 16     Temp 09/22/19 1132 99.3 F (37.4 C)     Temp Source 09/22/19 1132 Oral     SpO2 09/22/19 1132 100 %     Weight 09/22/19 1133 242 lb (109.8 kg)     Height 09/22/19 1133 5\' 2"  (1.575 m)     Head Circumference --      Peak Flow --      Pain Score 09/22/19 1132 8     Pain Loc --      Pain Edu? --      Excl. in [place]? --    Constitutional: Alert and oriented. Well appearing and in no acute distress. Eyes: Conjunctivae are normal.  Head: Atraumatic. Neck: No stridor.   Cardiovascular: Normal rate, regular rhythm. Grossly normal heart sounds.  Good peripheral circulation. Respiratory: Normal respiratory effort.  No retractions. Lungs CTAB. Gastrointestinal: Soft and nontender. No distention.No CVA tenderness. Musculoskeletal: On examination of the back there is no gross deformity.  There is tenderness on palpation of the sacral area and right SI joint which reproduces her pain in her hip.  Right hip range of motion is decreased secondary to discomfort.  Good muscle strength bilaterally.  Skin is intact.  No erythema or discoloration present.  Straight leg raises are positive with right leg approximately 30 degrees. Neurologic:  Normal speech and language. No gross focal neurologic deficits are appreciated. No gait instability. Skin:  Skin is warm, dry and intact. No rash noted. Psychiatric: Mood and affect are normal. Speech and behavior are normal.  ____________________________________________   LABS (all labs ordered are listed, but only abnormal results are displayed)  Labs Reviewed - No data to display   PROCEDURES  Procedure(s) performed (including Critical Care):  Procedures    ____________________________________________   INITIAL IMPRESSION / ASSESSMENT AND PLAN / ED COURSE  As part of my medical decision making, I reviewed the following data within the electronic MEDICAL RECORD NUMBER Notes from prior ED visits and [place] Controlled Substance Database  48 year old female presents to the ED with history of chronic low back pain and sciatica in the right leg.  Patient has a prescription for gabapentin with her but has been taking it as she was told with 1 tablet twice a week.  She states that she believes them to be "pain pills" and has not been taking them on a daily basis.  Physical exam is consistent with chronic low back pain with right sciatica.  Patient  denies any incontinence of bowel or bladder or saddle anesthesias.  A Lidoderm patch was placed on her hip but she states that this is the worst place.  Also prescription for tramadol was sent to her pharmacy.  She was given instructions to begin taking the gabapentin 300 mg 1 daily for the next 3 days and then increase to 1 tablet twice a day until she is able to see her PCP.  She will return to the ED if any severe worsening of her symptoms or urgent concerns.  ____________________________________________   FINAL CLINICAL IMPRESSION(S) / ED DIAGNOSES  Final diagnoses:  Chronic right-sided low back pain with right-sided sciatica  Chronic pain of right hip     ED Discharge Orders         Ordered    lidocaine (LIDODERM) 5 %  Every 12 hours     09/22/19 1240    traMADol (ULTRAM) 50 MG tablet  Every 6 hours PRN     09/22/19 1240           Note:  This document was prepared using Dragon voice recognition software and may include unintentional dictation errors.    Tommi Rumps, PA-C 09/22/19 1350    Sharman Cheek, MD 09/24/19 2025

## 2019-09-22 NOTE — ED Notes (Addendum)
See triage note. Pt c/o pain and running from back into R hip. Denies any increased activity. States it has simply gotten worse since being dx with bulging disc. States history of osteoarthritis and sciatica. Pt has full movement of R side/leg but c/o inc discomfort compared to L side. R dorsalis pedis pulse 2+. Pt states has tried ice/heat/ibuprofen/gabapentin without relief.

## 2019-09-22 NOTE — ED Notes (Signed)
Pt alert and oriented X 4, stable for discharge. RR even and unlabored, color WNL. Discussed discharge instructions and follow up when appropriate. Instructed to follow up with ER for any life threatening symptoms or concerns that patient or family of patient may have  

## 2019-12-28 ENCOUNTER — Emergency Department: Payer: Medicaid Other

## 2019-12-28 ENCOUNTER — Encounter: Payer: Self-pay | Admitting: Emergency Medicine

## 2019-12-28 ENCOUNTER — Emergency Department
Admission: EM | Admit: 2019-12-28 | Discharge: 2019-12-28 | Disposition: A | Discharge status: Home / Self Care | Payer: Medicaid Other | Attending: Emergency Medicine | Admitting: Emergency Medicine

## 2019-12-28 ENCOUNTER — Other Ambulatory Visit: Payer: Self-pay

## 2019-12-28 DIAGNOSIS — M25361 Other instability, right knee: Secondary | ICD-10-CM | POA: Diagnosis not present

## 2019-12-28 DIAGNOSIS — J45909 Unspecified asthma, uncomplicated: Secondary | ICD-10-CM | POA: Insufficient documentation

## 2019-12-28 DIAGNOSIS — Z79899 Other long term (current) drug therapy: Secondary | ICD-10-CM | POA: Diagnosis not present

## 2019-12-28 DIAGNOSIS — M25561 Pain in right knee: Secondary | ICD-10-CM | POA: Diagnosis present

## 2019-12-28 NOTE — ED Notes (Signed)
See triage note  Presents with right ankle pain  States she has chronic back pain and DDD  States she fell today unable to bear full wt on leg

## 2019-12-28 NOTE — ED Provider Notes (Signed)
[facility] Emergency Department Provider Note  ____________________________________________   I have reviewed the triage vital signs and the nursing notes.   HISTORY  Chief Complaint Hip Pain, Back Pain, and Knee Pain   History limited by: Not Limited   HPI Sierra Wiley is a 48 y.o. female who presents to the emergency department today because of concern for right knee weakness and pain. The patient states that for the past month the patient has been having issues where she will be standing and then feel a pop in her right knee. Feels like it is towards the back of her knee and causes her to become weak in that leg. Did have a fall today on gravel. Denies any significant injury after the fall. Says she does have a history of chronic right hip pain that is due to disc disease. Denies any current fevers, nausea, vomiting, shortness of breath.   Records reviewed. Per medical record review patient has a history of chronic right hip pain.   Past Medical History:  Diagnosis Date  . Asthma   . Bipolar 1 disorder ([place])   . Chronic back pain   . Depression   . Opiate use 02/21/2016  . PTSD (post-traumatic stress disorder)   . Seizures [facility])     Patient Active Problem List   Diagnosis Date Noted  . Tremor due to drug withdrawal ([place]) 10/07/2016  . Bipolar 1 disorder, depressed ([place]) 09/05/2016  . Chronic pain 02/21/2016  . Asthma 11/06/2015  . Post traumatic stress disorder 07/01/2015    Past Surgical History:  Procedure Laterality Date  . CHOLECYSTECTOMY    . ELBOW SURGERY Left 2000  . TUBAL LIGATION      Prior to Admission medications   Medication Sig Start Date End Date Taking? Authorizing Provider  albuterol (PROVENTIL HFA;VENTOLIN HFA) 108 (90 Base) MCG/ACT inhaler Inhale 2 puffs into the lungs every 6 (six) hours as needed for wheezing or shortness of breath.    [provider]  beclomethasone (QVAR) 80 MCG/ACT inhaler Inhale 2  puffs into the lungs 2 (two) times daily.    [provider]  clonazePAM (KLONOPIN) 0.5 MG tablet Take 1 tablet by mouth 2 (two) times daily. As needed    [provider]  gabapentin (NEURONTIN) 300 MG capsule Take 300 mg by mouth 2 (two) times a week.    [provider]  lidocaine (LIDODERM) 5 % Place 1 patch onto the skin every 12 (twelve) hours. Remove & Discard patch within 12 hours or as directed by MD 09/22/19 09/21/20  Johnn Hai, PA-C  omeprazole (PRILOSEC) 40 MG capsule Take 1 capsule (40 mg total) by mouth daily. 01/31/18 01/31/19  Orbie Pyo, MD  oxybutynin (DITROPAN-XL) 10 MG 24 hr tablet Take 10 mg by mouth at bedtime.    [provider]  propranolol (INDERAL) 20 MG tablet Take 10 mg by mouth 2 (two) times daily.    [provider]  tiZANidine (ZANAFLEX) 4 MG tablet Take 4 mg by mouth 2 (two) times daily.    [provider]  traMADol (ULTRAM) 50 MG tablet Take 1 tablet (50 mg total) by mouth every 6 (six) hours as needed for moderate pain. 09/22/19   Johnn Hai, PA-C  traZODone (DESYREL) 100 MG tablet Take 150-300 mg by mouth at bedtime.    [provider]    Allergies Penicillins  Family History  Problem Relation Age of Onset  . Alcohol abuse Mother   .  Drug abuse Mother   . HIV Mother   . Bipolar disorder Mother   . Cirrhosis Mother   . Cancer Father   . Heart disease Father   . Drug abuse Sister   . Alcohol abuse Sister     Social History Social History   Tobacco Use  . Smoking status: Never Smoker  . Smokeless tobacco: Never Used  Substance Use Topics  . Alcohol use: No    Alcohol/week: 0.0 standard drinks  . Drug use: No    Review of Systems Constitutional: No fever/chills Eyes: No visual changes. ENT: No sore throat. Cardiovascular: Denies chest pain. Respiratory: Denies shortness of breath. Gastrointestinal: No abdominal pain.  No nausea, no vomiting.  No diarrhea.    Genitourinary: Negative for dysuria. Musculoskeletal: Positive for right lower back pain, right hip pain, right knee popping and pain.  Skin: Negative for rash. Neurological: Negative for headaches, focal weakness or numbness.  ____________________________________________   PHYSICAL EXAM:  VITAL SIGNS: ED Triage Vitals  Enc Vitals Group     BP 12/28/19 1217 (!) 141/94     Pulse Rate 12/28/19 1217 78     Resp 12/28/19 1217 20     Temp 12/28/19 1217 97.8 F (36.6 C)     Temp Source 12/28/19 1217 Oral     SpO2 12/28/19 1217 100 %     Weight 12/28/19 1216 250 lb (113.4 kg)     Height 12/28/19 1216 5\' 3"  (1.6 m)     Head Circumference --      Peak Flow --      Pain Score 12/28/19 1222 7   Constitutional: Alert and oriented.  Eyes: Conjunctivae are normal.  ENT      Head: Normocephalic and atraumatic.      Nose: No congestion/rhinnorhea.      Mouth/Throat: Mucous membranes are moist.      Neck: No stridor. Hematological/Lymphatic/Immunilogical: No cervical lymphadenopathy. Cardiovascular: Normal rate, regular rhythm.  No murmurs, rubs, or gallops. Respiratory: Normal respiratory effort without tachypnea nor retractions. Breath sounds are clear and equal bilaterally. No wheezes/rales/rhonchi. Gastrointestinal: Soft and non tender. No rebound. No guarding.  Genitourinary: Deferred Musculoskeletal: Normal range of motion in all extremities. No lower extremity edema. Neurologic:  Normal speech and language. No gross focal neurologic deficits are appreciated.  Skin:  Skin is warm, dry and intact. No rash noted. Psychiatric: Mood and affect are normal. Speech and behavior are normal. Patient exhibits appropriate insight and judgment.  ____________________________________________    LABS (pertinent positives/negatives)  None  ____________________________________________   EKG  None  ____________________________________________    RADIOLOGY  Right knee x-ray No  acute abnormality  ____________________________________________   PROCEDURES  Procedures  ____________________________________________   INITIAL IMPRESSION / ASSESSMENT AND PLAN / ED COURSE  Pertinent labs & imaging results that were available during my care of the patient were reviewed by me and considered in my medical decision making (see chart for details).   Patient presented to the emergency department today because of concern for right knee popping and buckling. X-rays did not show any concerning osseous abnormality. Patient denied any injury after the fall today. At this time do think likely soft tissue cause, perhaps meniscus injury. Discussed this with the patient. Discussed importance of orthopedic follow up.   ____________________________________________   FINAL CLINICAL IMPRESSION(S) / ED DIAGNOSES  Final diagnoses:  Right knee buckling     Note: This dictation was prepared with Dragon dictation. Any transcriptional errors that result from this process  are unintentional     Phineas SemenGoodman, Ranette Luckadoo, MD 12/28/19 1424

## 2019-12-28 NOTE — Discharge Instructions (Addendum)
As we discussed the x-ray of your knee was negative. I think it is likely that you have a meniscus injury, however it is important that you follow up with orthopedics to get a true diagnosis.

## 2019-12-28 NOTE — ED Triage Notes (Signed)
Pt presents to ED via POV with c/o Hip pain, back pain, and R knee pain. Pt states hx of osteoarthritis, hx of DDD, and R knee problems. Pt states fell and and felt a pop to her R knee at this time.

## 2019-12-28 NOTE — ED Notes (Signed)
First Nurse note: Pt here via EMS from home with c/o right knee and right hip pain, currently in wheelchair at this time. Alert and oriented, NAD. EMS does state this knee and hip pain are chronic, pt states her knee gave out on her this am, caused her to fall and become injured.

## 2020-03-02 ENCOUNTER — Emergency Department: Payer: Medicaid Other

## 2020-03-02 ENCOUNTER — Emergency Department
Admission: EM | Admit: 2020-03-02 | Discharge: 2020-03-02 | Disposition: A | Discharge status: Home / Self Care | Payer: Medicaid Other | Attending: Emergency Medicine | Admitting: Emergency Medicine

## 2020-03-02 ENCOUNTER — Other Ambulatory Visit: Payer: Self-pay

## 2020-03-02 ENCOUNTER — Encounter: Payer: Self-pay | Admitting: Emergency Medicine

## 2020-03-02 DIAGNOSIS — Z79899 Other long term (current) drug therapy: Secondary | ICD-10-CM | POA: Insufficient documentation

## 2020-03-02 DIAGNOSIS — R05 Cough: Secondary | ICD-10-CM | POA: Diagnosis present

## 2020-03-02 DIAGNOSIS — J309 Allergic rhinitis, unspecified: Secondary | ICD-10-CM | POA: Insufficient documentation

## 2020-03-02 DIAGNOSIS — J4540 Moderate persistent asthma, uncomplicated: Secondary | ICD-10-CM | POA: Insufficient documentation

## 2020-03-02 DIAGNOSIS — R059 Cough, unspecified: Secondary | ICD-10-CM

## 2020-03-02 DIAGNOSIS — K219 Gastro-esophageal reflux disease without esophagitis: Secondary | ICD-10-CM | POA: Insufficient documentation

## 2020-03-02 MED ORDER — BENZONATATE 100 MG PO CAPS
100.0000 mg | ORAL_CAPSULE | Freq: Once | ORAL | Status: AC
  Administered 2020-03-02: 100 mg via ORAL
  Filled 2020-03-02: qty 1

## 2020-03-02 MED ORDER — PSEUDOEPH-BROMPHEN-DM 30-2-10 MG/5ML PO SYRP: 10.0000 mL | ORAL_SOLUTION | Freq: Four times a day (QID) | ORAL | 0 refills | Status: DC | PRN

## 2020-03-02 MED ORDER — ALBUTEROL SULFATE (2.5 MG/3ML) 0.083% IN NEBU
2.5000 mg | INHALATION_SOLUTION | Freq: Once | RESPIRATORY_TRACT | Status: AC
  Administered 2020-03-02: 2.5 mg via RESPIRATORY_TRACT
  Filled 2020-03-02: qty 3

## 2020-03-02 MED ORDER — PREDNISONE 50 MG PO TABS: 50.0000 mg | ORAL_TABLET | Freq: Every day | ORAL | 0 refills | Status: DC

## 2020-03-02 MED ORDER — CETIRIZINE HCL 10 MG PO TABS: 10.0000 mg | ORAL_TABLET | Freq: Every day | ORAL | 11 refills | Status: DC

## 2020-03-02 MED ORDER — PREDNISONE 20 MG PO TABS
60.0000 mg | ORAL_TABLET | Freq: Once | ORAL | Status: AC
  Administered 2020-03-02: 60 mg via ORAL
  Filled 2020-03-02: qty 3

## 2020-03-02 MED ORDER — FLUTICASONE PROPIONATE 50 MCG/ACT NA SUSP: 1.0000 | Freq: Two times a day (BID) | NASAL | 0 refills | Status: DC

## 2020-03-02 MED ORDER — PANTOPRAZOLE SODIUM 40 MG PO TBEC
40.0000 mg | DELAYED_RELEASE_TABLET | Freq: Every day | ORAL | Status: DC
  Administered 2020-03-02: 40 mg via ORAL
  Filled 2020-03-02: qty 1

## 2020-03-02 MED ORDER — BENZONATATE 100 MG PO CAPS: 100.0000 mg | ORAL_CAPSULE | Freq: Four times a day (QID) | ORAL | 0 refills | Status: DC | PRN

## 2020-03-02 MED ORDER — OMEPRAZOLE 10 MG PO CPDR: 10.0000 mg | DELAYED_RELEASE_CAPSULE | Freq: Every day | ORAL | 11 refills | Status: DC

## 2020-03-02 NOTE — ED Provider Notes (Signed)
[facility] Emergency Department Provider Note  ____________________________________________  Time seen: Approximately 9:59 PM  I have reviewed the triage vital signs and the nursing notes.   HISTORY  Chief Complaint Cough    HPI Sierra Wiley is a 49 y.o. female who presents the emergency department with a chronic cough x4 months.  Patient states that she developed worsening cough approximately 4 months ago.  She was evaluated by her primary care, had been tested for Covid with negative results.  Patient states that she has continued to have coughing x4 months.  No other symptoms.  She denies any nasal congestion, sore throat, shortness of breath, abdominal pain, nausea vomiting.  Patient does have a history of postnasal drip from allergic rhinitis and states that she is out of her allergy medications.  She also has a history of GERD at that she states that she is no longer taking medication for.  Patient has been using her prescribed asthma inhalers which is fluticasone and albuterol daily without significant relief.  Patient denies any other complaints.  No history of congestive heart failure, COPD.  Patient does not take any ACE inhibitor's.  Patient has a history of asthma, bipolar disorder, chronic back pain.         Past Medical History:  Diagnosis Date  . Asthma   . Bipolar 1 disorder (HCC)   . Chronic back pain   . Depression   . Opiate use 02/21/2016  . PTSD (post-traumatic stress disorder)   . Seizures [facility])     Patient Active Problem List   Diagnosis Date Noted  . 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 Bipolar 1 disorder, depressed (HCC) 09/05/2016  . Chronic pain 02/21/2016  . Asthma 11/06/2015  . Post traumatic stress disorder 07/01/2015    Past Surgical History:  Procedure Laterality Date  . CHOLECYSTECTOMY    . ELBOW SURGERY Left 2000  . TUBAL LIGATION      Prior to Admission medications   Medication Sig Start  Date End Date Taking? Authorizing Provider  albuterol (PROVENTIL HFA;VENTOLIN HFA) 108 (90 Base) MCG/ACT inhaler Inhale 2 puffs into the lungs every 6 (six) hours as needed for wheezing or shortness of breath.    [provider]  beclomethasone (QVAR) 80 MCG/ACT inhaler Inhale 2 puffs into the lungs 2 (two) times daily.    [provider]  clonazePAM (KLONOPIN) 0.5 MG tablet Take 1 tablet by mouth 2 (two) times daily. As needed    [provider]  gabapentin (NEURONTIN) 300 MG capsule Take 300 mg by mouth 2 (two) times a week.    [provider]  lidocaine (LIDODERM) 5 % Place 1 patch onto the skin every 12 (twelve) hours. Remove & Discard patch within 12 hours or as directed by MD 09/22/19 09/21/20  Tommi Rumps, PA-C  omeprazole (PRILOSEC) 40 MG capsule Take 1 capsule (40 mg total) by mouth daily. 01/31/18 01/31/19  Myrna Blazer, MD  oxybutynin (DITROPAN-XL) 10 MG 24 hr tablet Take 10 mg by mouth at bedtime.    [provider]  propranolol (INDERAL) 20 MG tablet Take 10 mg by mouth 2 (two) times daily.    [provider]  tiZANidine (ZANAFLEX) 4 MG tablet Take 4 mg by mouth 2 (two) times daily.    [provider]  traMADol (ULTRAM) 50 MG tablet Take 1 tablet (50 mg total) by mouth every 6 (six) hours as needed for moderate pain. 09/22/19  Tommi Rumps, PA-C  traZODone (DESYREL) 100 MG tablet Take 150-300 mg by mouth at bedtime.    [provider]    Allergies Penicillins  Family History  Problem Relation Age of Onset  . Alcohol abuse Mother   . Drug abuse Mother   . HIV Mother   . Bipolar disorder Mother   . Cirrhosis Mother   . Cancer Father   . Heart disease Father   . Drug abuse Sister   . Alcohol abuse Sister     Social History Social History   Tobacco Use  . Smoking status: Never Smoker  . Smokeless tobacco: Never Used  Substance Use Topics  . Alcohol use: No    Alcohol/week: 0.0  standard drinks  . Drug use: No     Review of Systems  Constitutional: No fever/chills Eyes: No visual changes. No discharge ENT: No upper respiratory complaints. Cardiovascular: no chest pain. Respiratory: Nonproductive cough x4 months. No SOB. Gastrointestinal: No abdominal pain.  No nausea, no vomiting.  No diarrhea.  No constipation. Musculoskeletal: Negative for musculoskeletal pain. Skin: Negative for rash, abrasions, lacerations, ecchymosis. Neurological: Negative for headaches, focal weakness or numbness. 10-point ROS otherwise negative.  ____________________________________________   PHYSICAL EXAM:  VITAL SIGNS: ED Triage Vitals [03/02/20 2054]  Enc Vitals Group     BP (!) 144/77     Pulse Rate 74     Resp 18     Temp 98.7 F (37.1 C)     Temp Source Oral     SpO2 98 %     Weight      Height      Head Circumference      Peak Flow      Pain Score      Pain Loc      Pain Edu?      Excl. in GC?      Constitutional: Alert and oriented. Well appearing and in no acute distress. Eyes: Conjunctivae are normal. PERRL. EOMI. Head: Atraumatic. ENT:      Ears:       Nose: Mild clear congestion/rhinnorhea.  Turbinates are boggy.      Mouth/Throat: Mucous membranes are moist.  Oropharynx is nonerythematous and nonedematous. Neck: No stridor.    Cardiovascular: Normal rate, regular rhythm. Normal S1 and S2.  Good peripheral circulation. Gastrointestinal: No external abdominal wall findings.  Bowel sounds x4 quadrants.  No tenderness on palpation to all quadrants.  Soft.  No palpable masses.  No distention. Respiratory: Patient with a nonproductive cough during interview.  However normal respiratory effort without tachypnea or retractions. Lungs CTAB. Good air entry to the bases with no decreased or absent breath sounds. Musculoskeletal: Full range of motion to all extremities. No gross deformities appreciated. Neurologic:  Normal speech and language. No gross focal  neurologic deficits are appreciated.  Skin:  Skin is warm, dry and intact. No rash noted. Psychiatric: Mood and affect are normal. Speech and behavior are normal. Patient exhibits appropriate insight and judgement.   ____________________________________________   LABS (all labs ordered are listed, but only abnormal results are displayed)  Labs Reviewed - No data to display ____________________________________________  EKG   ____________________________________________  RADIOLOGY I personally viewed and evaluated these images as part of my medical decision making, as well as reviewing the written report by the radiologist.  DG Chest 1 View  Result Date: 03/02/2020 CLINICAL DATA:  Cough for several months EXAM: CHEST  1 VIEW COMPARISON:  02/04/2018 FINDINGS: Cardiac shadows within  normal limits. The lungs are hypoinflated with some crowding of the vascular markings. No focal confluent infiltrate is seen. No bony abnormality is noted. IMPRESSION: Poor inspiratory effort with crowding of the vascular markings. No acute infiltrate is noted. Electronically Signed   By: Inez Catalina M.D.   On: 03/02/2020 21:21    ____________________________________________    PROCEDURES  Procedure(s) performed:    Procedures    Medications  predniSONE (DELTASONE) tablet 60 mg (has no administration in time range)  pantoprazole (PROTONIX) EC tablet 40 mg (has no administration in time range)  albuterol (PROVENTIL) (2.5 MG/3ML) 0.083% nebulizer solution 2.5 mg (has no administration in time range)  benzonatate (TESSALON) capsule 100 mg (has no administration in time range)     ____________________________________________   INITIAL IMPRESSION / ASSESSMENT AND PLAN / ED COURSE  Pertinent labs & imaging results that were available during my care of the patient were reviewed by me and considered in my medical decision making (see chart for details).  Review of the [place] CSRS was performed in  accordance of the [place] prior to dispensing any controlled drugs.           Patient's diagnosis is consistent with cough, asthma, GERD, allergic rhinitis.  Patient presented to the emergency department with an ongoing cough x4 months.  Differential includes chronic asthma, COPD, CHF, postnasal drip, increased GERD, lung mass/cancer, COVID-19, side effect of medication such as ACE inhibitor.  Cough is nonproductive in nature.  No fevers or chills, nasal congestion, sore throat.  Patient's had negative Covid tests during this increase in her cough.  She does have a history of allergic rhinitis but ran out of her medication.  She also has a history of GERD and also ran out of this medication.  Patient has been using her prescribed inhalers without relief.  Exam is reassuring with no adventitious lung sounds.  No concerning findings on chest x-ray.  I discussed likely differential with the patient.  At this time, I offered symptomatic control treating both the patient's allergic rhinitis, GERD, short course of steroids for asthma versus further testing with blood work, CT scan.  Patient request medications at this time, if symptoms do not improve she will follow-up with primary care.  Vital signs been stable.  While I did discuss further imaging, I feel symptomatic/conservative management first is very reasonable. . Patient will be discharged home with prescriptions for short course of prednisone, Flonase and Zyrtec, omeprazole, Bromfed cough syrup. Patient is to follow up with primary care as needed or otherwise directed. Patient is given ED precautions to return to the ED for any worsening or new symptoms.     ____________________________________________  FINAL CLINICAL IMPRESSION(S) / ED DIAGNOSES  Final diagnoses:  Cough      NEW MEDICATIONS STARTED DURING THIS VISIT:  ED Discharge Orders    None          This chart was dictated using voice recognition software/Dragon. Despite best  efforts to proofread, errors can occur which can change the meaning. Any change was purely unintentional.    Darletta Moll, PA-C 03/02/20 2228    Harvest Dark, MD 03/02/20 2249

## 2020-03-02 NOTE — ED Triage Notes (Signed)
Pt c/o cough x4 months. Pt denies fever and SOB.

## 2020-05-14 ENCOUNTER — Other Ambulatory Visit: Payer: Self-pay

## 2020-05-14 ENCOUNTER — Emergency Department
Admission: EM | Admit: 2020-05-14 | Discharge: 2020-05-14 | Disposition: A | Discharge status: Home / Self Care | Payer: Medicaid Other | Attending: Emergency Medicine | Admitting: Emergency Medicine

## 2020-05-14 ENCOUNTER — Emergency Department: Payer: Medicaid Other

## 2020-05-14 DIAGNOSIS — Z79899 Other long term (current) drug therapy: Secondary | ICD-10-CM | POA: Insufficient documentation

## 2020-05-14 DIAGNOSIS — Y929 Unspecified place or not applicable: Secondary | ICD-10-CM | POA: Diagnosis not present

## 2020-05-14 DIAGNOSIS — J45909 Unspecified asthma, uncomplicated: Secondary | ICD-10-CM | POA: Diagnosis not present

## 2020-05-14 DIAGNOSIS — M5441 Lumbago with sciatica, right side: Secondary | ICD-10-CM | POA: Diagnosis not present

## 2020-05-14 DIAGNOSIS — Y998 Other external cause status: Secondary | ICD-10-CM | POA: Insufficient documentation

## 2020-05-14 DIAGNOSIS — Y9389 Activity, other specified: Secondary | ICD-10-CM | POA: Insufficient documentation

## 2020-05-14 DIAGNOSIS — G8929 Other chronic pain: Secondary | ICD-10-CM | POA: Diagnosis not present

## 2020-05-14 DIAGNOSIS — M25551 Pain in right hip: Secondary | ICD-10-CM

## 2020-05-14 DIAGNOSIS — W19XXXA Unspecified fall, initial encounter: Secondary | ICD-10-CM | POA: Diagnosis not present

## 2020-05-14 LAB — POCT PREGNANCY, URINE: Preg Test, Ur: NEGATIVE

## 2020-05-14 MED ORDER — TRAMADOL HCL 50 MG PO TABS
50.0000 mg | ORAL_TABLET | Freq: Once | ORAL | Status: AC
  Administered 2020-05-14: 50 mg via ORAL
  Filled 2020-05-14: qty 1

## 2020-05-14 MED ORDER — TRAMADOL HCL 50 MG PO TABS: 50.0000 mg | ORAL_TABLET | Freq: Three times a day (TID) | ORAL | 0 refills | Status: AC | PRN

## 2020-05-14 NOTE — ED Triage Notes (Addendum)
Patient coming [facility] from home for chronic right hip pain. Patient denies fall for ems. Patient seen for same in past. Patient was informed she has "bone on bone" in right hip. Patient ambulatory to the stretcher.   Patient now informing this RN that she fell in the tub 2 days ago onto right hip.

## 2020-05-14 NOTE — Discharge Instructions (Addendum)
You were seen today for right hip pain after fall.  Your x-ray is essentially unchanged from your prior x-ray at [facility] 12/2019.  I have given you a prescription for tramadol to take every 8 hours as needed.  Continue your gabapentin and muscle relaxers as previously prescribed.  You already have a follow-up with [facility] planned, please keep this appointment.

## 2020-05-14 NOTE — ED Notes (Signed)
See triage note  Presents with right hip pain  States she was told she had"bone on bone" to that hip  Having increasing pain

## 2020-05-14 NOTE — ED Provider Notes (Signed)
[facility] Emergency Department Provider Note ____________________________________________  Time seen: 0720  I have reviewed the triage vital signs and the nursing notes.  HISTORY  Chief Complaint  Hip Pain   HPI Sierra Wiley is a 49 y.o. female presents to the ER today with complaint of right hip pain after a fall that occurred 2 days ago.  She describes the pain as grinding.  The pain is worse with weightbearing.  She has not noticed any bruising or swelling of the right hip.  She has a history of chronic low back pain with right-sided sciatica and chronic right hip pain.  She is following with [facility] orthopedics.  She reports she has had multiple injections in her back with minimal relief.  She is about to have a anterior hip injection.  She has been referred for physical therapy but has not started this yet.  She is taking Gabapentin and muscle relaxers as prescribed with minimal relief.  Past Medical History:  Diagnosis Date  . Asthma   . Bipolar 1 disorder (HCC)   . Chronic back pain   . Depression   . Opiate use 02/21/2016  . PTSD (post-traumatic stress disorder)   . Seizures [facility])     Patient Active Problem List   Diagnosis Date Noted  . 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 Bipolar 1 disorder, depressed (HCC) 09/05/2016  . Chronic pain 02/21/2016  . Asthma 11/06/2015  . Post traumatic stress disorder 07/01/2015    Past Surgical History:  Procedure Laterality Date  . CHOLECYSTECTOMY    . ELBOW SURGERY Left 2000  . TUBAL LIGATION      Prior to Admission medications   Medication Sig Start Date End Date Taking? Authorizing Provider  albuterol (PROVENTIL HFA;VENTOLIN HFA) 108 (90 Base) MCG/ACT inhaler Inhale 2 puffs into the lungs every 6 (six) hours as needed for wheezing or shortness of breath.    [provider]  beclomethasone (QVAR) 80 MCG/ACT inhaler Inhale 2 puffs into the lungs 2 (two) times daily.     [provider]  cetirizine (ZYRTEC) 10 MG tablet Take 1 tablet (10 mg total) by mouth daily. 03/02/20   Cuthriell, Delorise Royals, PA-C  clonazePAM (KLONOPIN) 0.5 MG tablet Take 1 tablet by mouth 2 (two) times daily. As needed    [provider]  fluticasone (FLONASE) 50 MCG/ACT nasal spray Place 1 spray into both nostrils 2 (two) times daily. 03/02/20   Cuthriell, Delorise Royals, PA-C  gabapentin (NEURONTIN) 300 MG capsule Take 300 mg by mouth 2 (two) times a week.    [provider]  omeprazole (PRILOSEC) 10 MG capsule Take 1 capsule (10 mg total) by mouth daily. 03/02/20   Cuthriell, Delorise Royals, PA-C  oxybutynin (DITROPAN-XL) 10 MG 24 hr tablet Take 10 mg by mouth at bedtime.    [provider]  propranolol (INDERAL) 20 MG tablet Take 10 mg by mouth 2 (two) times daily.    [provider]  traMADol (ULTRAM) 50 MG tablet Take 1 tablet (50 mg total) by mouth every 8 (eight) hours as needed. 05/14/20 05/14/21  Lorre Munroe, NP  traZODone (DESYREL) 100 MG tablet Take 150-300 mg by mouth at bedtime.    [provider]    Allergies Penicillins  Family History  Problem Relation Age of Onset  . Alcohol abuse Mother   . Drug abuse Mother   . HIV Mother   . Bipolar disorder Mother   . Cirrhosis Mother   .  Cancer Father   . Heart disease Father   . Drug abuse Sister   . Alcohol abuse Sister     Social History Social History   Tobacco Use  . Smoking status: Never Smoker  . Smokeless tobacco: Never Used  Substance Use Topics  . Alcohol use: No    Alcohol/week: 0.0 standard drinks  . Drug use: No    Review of Systems  Constitutional: Negative for fever, chills or body aches. Cardiovascular: Negative for chest pain or chest tightness. Respiratory: Negative for cough or shortness of breath. Musculoskeletal: Positive for chronic low back pain with right-sided sciatica, acute on chronic right hip pain, difficulty with gait.  Negative for right  knee or ankle pain.  Negative for joint swelling. Skin: Negative for bruising or abrasion. Neurological: Positive for numbness, tingling and weakness of the RLE.   ____________________________________________  PHYSICAL EXAM:  VITAL SIGNS: ED Triage Vitals  Enc Vitals Group     BP 05/14/20 0635 (!) 147/89     Pulse Rate 05/14/20 0635 74     Resp 05/14/20 0635 18     Temp 05/14/20 0635 99 F (37.2 C)     Temp Source 05/14/20 0635 Oral     SpO2 05/14/20 0635 100 %     Weight 05/14/20 0637 249 lb 1.9 oz (113 kg)     Height 05/14/20 0637 5\' 3"  (1.6 m)     Head Circumference --      Peak Flow --      Pain Score 05/14/20 0637 8     Pain Loc --      Pain Edu? --      Excl. in GC? --     Constitutional: Alert and oriented. Obese, appears in pain, but  in no distress. Head: Normocephalic and atraumatic. Eyes: Conjunctivae are normal. PERRL. Normal extraocular movements Cardiovascular: Normal rate, regular rhythm. Pedal pulses 2+ bilaterally. Respiratory: Normal respiratory effort. No wheezes/rales/rhonchi. Musculoskeletal: Decreased flexion and extension of the spine secondary to pain. Normal rotation and lateral bending of the spine. Pain with palpation of the lumbar spine, right SI joint and bilateral paralumbar muscles. Normal abduction and adduction of the right hip, but painful. Decreased internal and external rotation of the right hip due to pain. Pain with palpation of the right hip. Strength 5/5 BLE. Limping gait. Able to stand on heels and toes.  Neurologic:  Normal gait without ataxia. Normal speech and language. Decreased sensation to light touch RLE.  Skin:  Skin is warm, dry and intact. No abrasion or bruising noted. Psychiatric: Mood and affect are normal. Patient exhibits appropriate insight and judgment. ____________________________________________   RADIOLOGY  Imaging Orders     DG Hip Unilat  With Pelvis 2-3 Views Right IMPRESSION:  1. No acute findings.  2.  Osteoarthritis.    ____________________________________________  ________________________________________  INITIAL IMPRESSION / ASSESSMENT AND PLAN / ED COURSE  Acute on Chronic Right Hip Pain s/p Fall, Chronic Low Back Pain with Right Sided Sciatica:  Xray right hip negative Tramadol 50 mg PO x 1 Encouraged ice, stretching RX for Tramadol 50 mg TID prn  Continue Gabapentin and muscle relaxers as previously prescribed Follow up with [facility] as an outpatient   I reviewed the patient's prescription history over the last 12 months in the multi-state controlled substances database(s) that includes [place], [place], [place], [place], [place], [place], [place], [place], [place], [place], [place], [place], [place], and [place].  Results were notable for Tramadol 50 mg #  12, 07/2019. ____________________________________________  FINAL CLINICAL IMPRESSION(S) / ED DIAGNOSES  Final diagnoses:  Chronic right hip pain  Fall, initial encounter  Chronic midline low back pain with right-sided sciatica      Jearld Fenton, NP 05/14/20 0818    Vanessa Onley, MD 05/14/20 0830

## 2020-10-08 ENCOUNTER — Other Ambulatory Visit: Payer: Self-pay

## 2020-10-08 ENCOUNTER — Emergency Department: Payer: Medicaid Other

## 2020-10-08 DIAGNOSIS — Z7951 Long term (current) use of inhaled steroids: Secondary | ICD-10-CM | POA: Insufficient documentation

## 2020-10-08 DIAGNOSIS — E876 Hypokalemia: Secondary | ICD-10-CM | POA: Diagnosis not present

## 2020-10-08 DIAGNOSIS — J4541 Moderate persistent asthma with (acute) exacerbation: Secondary | ICD-10-CM | POA: Diagnosis not present

## 2020-10-08 DIAGNOSIS — R0602 Shortness of breath: Secondary | ICD-10-CM | POA: Diagnosis present

## 2020-10-08 LAB — BASIC METABOLIC PANEL
Anion gap: 11 (ref 5–15)
BUN: 9 mg/dL (ref 6–20)
CO2: 25 mmol/L (ref 22–32)
Calcium: 8.9 mg/dL (ref 8.9–10.3)
Chloride: 103 mmol/L (ref 98–111)
Creatinine, Ser: 0.82 mg/dL (ref 0.44–1.00)
GFR, Estimated: >60 mL/min (ref >60)
Glucose, Bld: 115 mg/dL — ABNORMAL HIGH (ref 70–99)
Potassium: 3.2 mmol/L — ABNORMAL LOW (ref 3.5–5.1)
Sodium: 139 mmol/L (ref 135–145)

## 2020-10-08 LAB — CBC
HCT: 37 % (ref 36.0–46.0)
Hemoglobin: 12 g/dL (ref 12.0–15.0)
MCH: 28.9 pg (ref 26.0–34.0)
MCHC: 32.4 g/dL (ref 30.0–36.0)
MCV: 89.2 fL (ref 80.0–100.0)
Platelets: 356 10*3/uL (ref 150–400)
RBC: 4.15 MIL/uL (ref 3.87–5.11)
RDW: 15.4 % (ref 11.5–15.5)
WBC: 7.1 10*3/uL (ref 4.0–10.5)
nRBC: 0 % (ref 0.0–0.2)

## 2020-10-08 LAB — POCT PREGNANCY, URINE: Preg Test, Ur: NEGATIVE

## 2020-10-08 NOTE — ED Triage Notes (Signed)
Pt arrived via ems with c/o SOB. Pt reports dry cough x 1 year, but increased SOB over the last couple day. Pt states she has been seen multiple times over last year for sob/cough, PCP informed pt on Tuesday he believes it is due to COPD. Pt currently uses inhalers and steroids, started HTN medications on Tuesday. NAD noted at this time.

## 2020-10-09 ENCOUNTER — Emergency Department
Admission: EM | Admit: 2020-10-09 | Discharge: 2020-10-09 | Disposition: A | Discharge status: Home / Self Care | Payer: Medicaid Other | Attending: Emergency Medicine | Admitting: Emergency Medicine

## 2020-10-09 DIAGNOSIS — R058 Other specified cough: Secondary | ICD-10-CM

## 2020-10-09 DIAGNOSIS — J4541 Moderate persistent asthma with (acute) exacerbation: Secondary | ICD-10-CM

## 2020-10-09 DIAGNOSIS — R0602 Shortness of breath: Secondary | ICD-10-CM

## 2020-10-09 DIAGNOSIS — E876 Hypokalemia: Secondary | ICD-10-CM

## 2020-10-09 LAB — TROPONIN I (HIGH SENSITIVITY): Troponin I (High Sensitivity): 6 ng/L (ref <18)

## 2020-10-09 MED ORDER — PREDNISONE 50 MG PO TABS: 50.0000 mg | ORAL_TABLET | Freq: Every day | ORAL | 0 refills | Status: AC

## 2020-10-09 MED ORDER — IPRATROPIUM BROMIDE 0.02 % IN SOLN
0.5000 mg | Freq: Once | RESPIRATORY_TRACT | Status: AC
  Administered 2020-10-09: 0.5 mg via RESPIRATORY_TRACT
  Filled 2020-10-09: qty 2.5

## 2020-10-09 MED ORDER — ACETAMINOPHEN 500 MG PO TABS
1000.0000 mg | ORAL_TABLET | Freq: Once | ORAL | Status: AC
  Administered 2020-10-09: 1000 mg via ORAL
  Filled 2020-10-09: qty 2

## 2020-10-09 MED ORDER — DEXAMETHASONE 4 MG PO TABS: 8.0000 mg | ORAL_TABLET | Freq: Once | ORAL | Status: DC

## 2020-10-09 MED ORDER — PREDNISONE 20 MG PO TABS
60.0000 mg | ORAL_TABLET | Freq: Once | ORAL | Status: AC
  Administered 2020-10-09: 60 mg via ORAL
  Filled 2020-10-09: qty 3

## 2020-10-09 MED ORDER — POTASSIUM CHLORIDE 20 MEQ PO PACK
40.0000 meq | PACK | Freq: Once | ORAL | Status: AC
  Administered 2020-10-09: 40 meq via ORAL
  Filled 2020-10-09: qty 2

## 2020-10-09 MED ORDER — ALBUTEROL SULFATE (2.5 MG/3ML) 0.083% IN NEBU
5.0000 mg | INHALATION_SOLUTION | Freq: Once | RESPIRATORY_TRACT | Status: AC
  Administered 2020-10-09: 5 mg via RESPIRATORY_TRACT
  Filled 2020-10-09: qty 6

## 2020-10-09 NOTE — Discharge Instructions (Addendum)
You were seen in the ED because of your shortness of breath.  As we discussed, you very well may have a degree of COPD on top of your asthma.   You have no signs of pneumonia or strain/damage to your heart.  You are being discharged with a prescription for prednisone steroids to take once daily for the next 4 days.  We gave you 1 tablet around midnight early Monday morning, please take in the evenings for the next 4 days to finish a 5-day course of this medication.  I attached the information for [place] [facility], this is a group of local lung doctors should be able to see you in the clinic in the next couple weeks to be evaluated for possible COPD.  If you develop any worsening symptoms despite the steroids, please return to the ED.  Otherwise continue all of your normal medications.

## 2020-10-09 NOTE — ED Provider Notes (Signed)
[facility] Emergency Department Provider Note ____________________________________________   First MD Initiated Contact with Patient 10/09/20 0002     (approximate)  I have reviewed the triage vital signs and the nursing notes.  HISTORY  Chief Complaint Shortness of Breath   HPI Sierra Wiley is a 49 y.o. femalewho presents to the ED for evaluation of shortness of breath.   Chart review indicates hx Bipolar disorder, GERD, HTN and anxiety. Patient with a history of asthma on Symbicort inhaled corticosteroids, as needed albuterol.  She reports finishing a course of oral steroid burst about a month ago. She reports copious secondhand smoke exposure at home.  She reports about 1 year of nonproductive cough and shortness of breath that is waxing and waning.    Patient been to the ED with 3 days of acute worsening of her baseline shortness of breath and nonproductive cough.  She reports associated tightness sensation substernally when she takes a large breath and when she gets into a coughing fit.  She denies any chest pain at rest or outside of her increased respiratory symptoms.  She denies syncope, increased pedal production, fever, abdominal pain, vomiting or diarrhea.  She reports tolerating p.o. intake and toileting at her baseline.  She notes minimal improvement of her symptoms with home albuterol.    Past Medical History:  Diagnosis Date  . Asthma   . Bipolar 1 disorder (HCC)   . Chronic back pain   . Depression   . Opiate use 02/21/2016  . PTSD (post-traumatic stress disorder)   . Seizures [facility])     Patient Active Problem List   Diagnosis Date Noted  . 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 Bipolar 1 disorder, depressed (HCC) 09/05/2016  . Chronic pain 02/21/2016  . Asthma 11/06/2015  . Post traumatic stress disorder 07/01/2015    Past Surgical History:  Procedure Laterality Date  . CHOLECYSTECTOMY    . ELBOW SURGERY  Left 2000  . TUBAL LIGATION      Prior to Admission medications   Medication Sig Start Date End Date Taking? Authorizing Provider  albuterol (PROVENTIL HFA;VENTOLIN HFA) 108 (90 Base) MCG/ACT inhaler Inhale 2 puffs into the lungs every 6 (six) hours as needed for wheezing or shortness of breath.    [provider]  beclomethasone (QVAR) 80 MCG/ACT inhaler Inhale 2 puffs into the lungs 2 (two) times daily.    [provider]  cetirizine (ZYRTEC) 10 MG tablet Take 1 tablet (10 mg total) by mouth daily. 03/02/20   Cuthriell, Delorise Royals, PA-C  clonazePAM (KLONOPIN) 0.5 MG tablet Take 1 tablet by mouth 2 (two) times daily. As needed    [provider]  fluticasone (FLONASE) 50 MCG/ACT nasal spray Place 1 spray into both nostrils 2 (two) times daily. 03/02/20   Cuthriell, Delorise Royals, PA-C  gabapentin (NEURONTIN) 300 MG capsule Take 300 mg by mouth 2 (two) times a week.    [provider]  omeprazole (PRILOSEC) 10 MG capsule Take 1 capsule (10 mg total) by mouth daily. 03/02/20   Cuthriell, Delorise Royals, PA-C  oxybutynin (DITROPAN-XL) 10 MG 24 hr tablet Take 10 mg by mouth at bedtime.    [provider]  predniSONE (DELTASONE) 50 MG tablet Take 1 tablet (50 mg total) by mouth daily for 4 days. 10/09/20 10/13/20  Delton Prairie, MD  propranolol (INDERAL) 20 MG tablet Take 10 mg by mouth 2 (two) times daily.    [provider]  traMADol (  ULTRAM) 50 MG tablet Take 1 tablet (50 mg total) by mouth every 8 (eight) hours as needed. 05/14/20 05/14/21  Lorre Munroe, NP  traZODone (DESYREL) 100 MG tablet Take 150-300 mg by mouth at bedtime.    [provider]    Allergies Penicillins  Family History  Problem Relation Age of Onset  . Alcohol abuse Mother   . Drug abuse Mother   . HIV Mother   . Bipolar disorder Mother   . Cirrhosis Mother   . Cancer Father   . Heart disease Father   . Drug abuse Sister   . Alcohol abuse Sister     Social  History Social History   Tobacco Use  . Smoking status: Never Smoker  . Smokeless tobacco: Never Used  Vaping Use  . Vaping Use: Never used  Substance Use Topics  . Alcohol use: No    Alcohol/week: 0.0 standard drinks  . Drug use: No    Review of Systems  Constitutional: No fever/chills Eyes: No visual changes. ENT: No sore throat. Cardiovascular: Positive for chest pain. Respiratory: Positive for nonproductive cough and shortness of breath. Gastrointestinal: No abdominal pain.  No nausea, no vomiting.  No diarrhea.  No constipation. Genitourinary: Negative for dysuria. Musculoskeletal: Negative for back pain. Skin: Negative for rash. Neurological: Negative for headaches, focal weakness or numbness.  ____________________________________________   PHYSICAL EXAM:  VITAL SIGNS: Vitals:   10/09/20 0100 10/09/20 0125  BP: 133/79   Pulse: 94 87  Resp: 20 19  Temp:    SpO2: 100% 99%      Constitutional: Alert and oriented. Well appearing and in no acute distress.  Speaking in full sentences. Eyes: Conjunctivae are normal. PERRL. EOMI. Head: Atraumatic. Nose: No congestion/rhinnorhea. Mouth/Throat: Mucous membranes are moist.  Oropharynx non-erythematous. Neck: No stridor. No cervical spine tenderness to palpation. Cardiovascular: Normal rate, regular rhythm. Grossly normal heart sounds.  Good peripheral circulation. Respiratory:  No retractions.  Mild tachypnea to the low 20s, otherwise no signs of distress. Faint expiratory wheezes, only moderate air movement throughout and a prolonged expiratory phase. Gastrointestinal: Soft , nondistended, nontender to palpation. No abdominal bruits. No CVA tenderness. Musculoskeletal: No lower extremity tenderness nor edema.  No joint effusions. No signs of acute trauma. Neurologic:  Normal speech and language. No gross focal neurologic deficits are appreciated. No gait instability noted. Skin:  Skin is warm, dry and intact. No  rash noted. Psychiatric: Mood and affect are normal. Speech and behavior are normal.  ____________________________________________   LABS (all labs ordered are listed, but only abnormal results are displayed)  Labs Reviewed  BASIC METABOLIC PANEL - Abnormal; Notable for the following components:      Result Value   Potassium 3.2 (*)    Glucose, Bld 115 (*)    All other components within normal limits  CBC  POCT PREGNANCY, URINE  TROPONIN I (HIGH SENSITIVITY)   ____________________________________________  12 Lead EKG  Sinus rhythm, rate 88 bpm.  Normal axis and intervals.  No evidence of acute ischemia. ____________________________________________  RADIOLOGY  ED MD interpretation: 2 view CXR reviewed without evidence of acute cardiopulmonary pathology  Official radiology report(s): DG Chest 2 View  Result Date: 10/08/2020 CLINICAL DATA:  Worsening shortness of breath for several days. Chronic nonproductive cough. EXAM: CHEST - 2 VIEW COMPARISON:  03/02/2020 FINDINGS: The heart size and mediastinal contours are within normal limits. Both lungs are clear. The visualized skeletal structures are unremarkable. IMPRESSION: No active cardiopulmonary disease. Electronically Signed  By: Danae Orleans M.D.   On: 10/08/2020 20:02    ____________________________________________   PROCEDURES and INTERVENTIONS  Procedure(s) performed (including Critical Care):  Procedures  Medications  albuterol (PROVENTIL) (2.5 MG/3ML) 0.083% nebulizer solution 5 mg (5 mg Nebulization Given 10/09/20 0029)  ipratropium (ATROVENT) nebulizer solution 0.5 mg (0.5 mg Nebulization Given 10/09/20 0030)  acetaminophen (TYLENOL) tablet 1,000 mg (1,000 mg Oral Given 10/09/20 0029)  potassium chloride (KLOR-CON) packet 40 mEq (40 mEq Oral Given 10/09/20 0029)  predniSONE (DELTASONE) tablet 60 mg (60 mg Oral Given 10/09/20 0029)    ____________________________________________   MDM / ED  COURSE  49 year old female with known asthma presenting with acute on chronic shortness of breath and nonproductive cough, most consistent with acute exacerbation amenable to outpatient management with steroid course and pulmonology follow-up.  Normal vital signs on room air.  Exam with mild tachypnea, otherwise without distress.  Patient speaking in full sentences, but with poor air movement and prolonged expiratory phase.  Blood work with mild hypokalemia, that was repleted orally.  EKG shows nonischemic sinus rhythm and negative troponin.  Patient has no history of cardiac disease and her chest pain is likely due to her bronchoconstriction.  CXR shows no infiltrates, patient has no increased sputum production to suggest an infectious etiology of her symptoms.  Patient reports improving symptoms after albuterol neb with Atrovent and oral prednisone administration.  Improved pulmonary auscultation examination.  Will discharge with steroid burst and information for pulmonology assessment.  We discussed return precautions for the ED and patient is medically stable for discharge home.  Clinical Course as of Oct 09 130  Mon Oct 09, 2020  0107 Reassessed.  Patient reports improving symptoms.  Reevaluation of pulmonary exam demonstrates improved airflow.   [DS]    Clinical Course User Index [DS] Delton Prairie, MD     ____________________________________________   FINAL CLINICAL IMPRESSION(S) / ED DIAGNOSES  Final diagnoses:  Shortness of breath  Nonproductive cough  Moderate persistent asthma with exacerbation  Hypokalemia     ED Discharge Orders         Ordered    predniSONE (DELTASONE) 50 MG tablet  Daily        10/09/20 0019           Sameen Leas Katrinka Blazing   Note:  This document was prepared using Dragon voice recognition software and may include unintentional dictation errors.   Delton Prairie, MD 10/09/20 [phone]

## 2020-11-08 ENCOUNTER — Encounter: Payer: Self-pay | Admitting: Pulmonary Disease

## 2020-11-08 ENCOUNTER — Other Ambulatory Visit
Admission: RE | Admit: 2020-11-08 | Discharge: 2020-11-08 | Disposition: A | Discharge status: Home / Self Care | Payer: Medicaid Other | Attending: Pulmonary Disease | Admitting: Pulmonary Disease

## 2020-11-08 ENCOUNTER — Other Ambulatory Visit: Payer: Self-pay

## 2020-11-08 ENCOUNTER — Ambulatory Visit (INDEPENDENT_AMBULATORY_CARE_PROVIDER_SITE_OTHER): Payer: Medicaid Other | Admitting: Pulmonary Disease

## 2020-11-08 VITALS — BP 112/74 | HR 81 | Temp 97.1°F | Ht 63.0 in | Wt 240.4 lb

## 2020-11-08 DIAGNOSIS — J4540 Moderate persistent asthma, uncomplicated: Secondary | ICD-10-CM | POA: Diagnosis not present

## 2020-11-08 DIAGNOSIS — R0602 Shortness of breath: Secondary | ICD-10-CM | POA: Diagnosis present

## 2020-11-08 DIAGNOSIS — J3089 Other allergic rhinitis: Secondary | ICD-10-CM

## 2020-11-08 DIAGNOSIS — Z7722 Contact with and (suspected) exposure to environmental tobacco smoke (acute) (chronic): Secondary | ICD-10-CM

## 2020-11-08 MED ORDER — SPIRIVA RESPIMAT 1.25 MCG/ACT IN AERS: 2.0000 | INHALATION_SPRAY | Freq: Every day | RESPIRATORY_TRACT | 11 refills | Status: DC

## 2020-11-08 MED ORDER — SPIRIVA RESPIMAT 1.25 MCG/ACT IN AERS
2.0000 | INHALATION_SPRAY | Freq: Every day | RESPIRATORY_TRACT | 0 refills | Status: AC
Start: 2020-11-08 — End: 2020-11-09

## 2020-11-08 MED ORDER — BUDESONIDE-FORMOTEROL FUMARATE 160-4.5 MCG/ACT IN AERO: 2.0000 | INHALATION_SPRAY | Freq: Two times a day (BID) | RESPIRATORY_TRACT | 11 refills | Status: DC

## 2020-11-08 MED ORDER — CETIRIZINE HCL 10 MG PO TABS: 10.0000 mg | ORAL_TABLET | Freq: Every day | ORAL | 6 refills | Status: DC

## 2020-11-08 NOTE — Patient Instructions (Addendum)
We are getting breathing tests.  Put in for a blood test to check for allergies.  I will make your Symbicort stronger it will be 160/4.5, 2 inhalations twice a day.  We are adding Spiriva 1.25 mcg, 2 inhalations daily, use it in the morning after your Symbicort.  May continue using albuterol as needed (emergency inhaler).  I recommend you take Zyrtec (certirizine) 10 mg 1 tablet at bedtime you can get this over the counter.  You can use generic/pharmacy brand (we sent a prescription to your pharmacy).  We will see you in follow-up in 4 to 6 weeks time call sooner should any new problems arise.

## 2020-11-08 NOTE — Progress Notes (Signed)
Subjective:    Patient ID: Sierra Wiley, female    DOB: 1971/02/12, 49 y.o.   MRN: [id number]  HPI The patient is a 49 year old lifelong never smoker, who presents for evaluation and management of asthma.  She is referred by [facility] emergency room and [facility].  For the last year she has had issues with recurrent dry cough and shortness of breath which waxes and wanes.  Is on Symbicort 80/4.52 puffs twice a day and as needed albuterol with poor control of the symptoms.  She does note that they do improve her symptoms some but do not keep her controlled.  She does note relief when she takes steroids.  She was seen in the emergency room on 11 October with an exacerbation and required steroids and nebulizer treatments.  She does doors environmental allergies but not sure what specifically triggers are.  Have significant issues with nasal congestion and postnasal drip.  She has not had any orthopnea or paroxysmal nocturnal dyspnea.  No nausea vomiting.  No hemoptysis.  No fevers, chills or sweats.  No she does not smoke she is exposed to secondhand smoke as her boyfriend smokes 2 packs of cigarettes per day.  He will smoke inside the house.  We have reviewed her medications as below note is made that she is on Inderal, this is a nonselective beta-blocker which can exacerbate asthmatic symptoms.   Review of Systems A 10 point review of systems was performed and it is as noted above otherwise negative.  Past Medical History:  Diagnosis Date  . Asthma   . Bipolar 1 disorder (HCC)   . Chronic back pain   . Depression   . Opiate use 02/21/2016  . PTSD (post-traumatic stress disorder)   . Seizures [facility])    Patient Active Problem List   Diagnosis Date Noted  . 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 Bipolar 1 disorder, depressed (HCC) 09/05/2016  . Chronic pain 02/21/2016  . Asthma 11/06/2015  . Post traumatic stress disorder 07/01/2015   Past Surgical History:   Procedure Laterality Date  . CHOLECYSTECTOMY    . ELBOW SURGERY Left 2000  . TUBAL LIGATION     Family History  Problem Relation Age of Onset  . Alcohol abuse Mother   . Drug abuse Mother   . HIV Mother   . Bipolar disorder Mother   . Cirrhosis Mother   . Cancer Father   . Heart disease Father   . Drug abuse Sister   . Alcohol abuse Sister    Social History   Tobacco Use  . Smoking status: Never Smoker  . Smokeless tobacco: Never Used  Substance Use Topics  . Alcohol use: No    Alcohol/week: 0.0 standard drinks   Allergies  Allergen Reactions  . Penicillins Nausea And Vomiting   Current Meds  Medication Sig  . albuterol (PROVENTIL HFA;VENTOLIN HFA) 108 (90 Base) MCG/ACT inhaler Inhale 2 puffs into the lungs every 6 (six) hours as needed for wheezing or shortness of breath.  Marland Kitchen amLODipine (NORVASC) 2.5 MG tablet Take 2.5 mg by mouth daily.  Marland Kitchen aripiprazole (ABILIFY) 10 MG disintegrating tablet Take 10 mg by mouth in the morning and at bedtime.  . cetirizine (ZYRTEC) 10 MG tablet Take 1 tablet (10 mg total) by mouth at bedtime.  . fluticasone (FLONASE) 50 MCG/ACT nasal spray Place 1 spray into both nostrils 2 (two) times daily.  . hydrOXYzine (ATARAX/VISTARIL) 10 MG tablet Take 10 mg by  mouth every 6 (six) hours as needed.  . lidocaine (LIDODERM) 5 % Place 1 patch onto the skin daily. Remove & Discard patch within 12 hours or as directed by MD  . liraglutide (VICTOZA) 18 MG/3ML SOPN Inject 0.6 mg into the skin daily.  . prazosin (MINIPRESS) 2 MG capsule Take 2 mg by mouth at bedtime.  . vitamin B-12 (CYANOCOBALAMIN) 1000 MCG tablet Take 1,000 mcg by mouth daily.  . [DISCONTINUED] budesonide-formoterol (SYMBICORT) 80-4.5 MCG/ACT inhaler Inhale 2 puffs into the lungs 2 (two) times daily.           Immunization History  Administered Date(s) Administered  . Influenza-Unspecified 10/30/2020  . Moderna SARS-COVID-2 Vaccination 04/30/2020, 05/28/2020      Objective:    Physical Exam BP 112/74 (BP Location: Left Arm, Patient Position: Sitting, Cuff Size: Normal)   Pulse 81   Temp (!) 97.1 F (36.2 C) (Temporal)   Ht 5\' 3"  (1.6 m)   Wt 240 lb 6.4 oz (109 kg)   SpO2 99%   BMI 42.58 kg/m    GENERAL: This woman, no acute distress.  Fully ambulatory.  No conversational dyspnea. HEAD: Normocephalic, atraumatic.  EYES: Pupils equal, round, reactive to light.  No scleral icterus.  MOUTH: Nose/mouth/throat not examined due to masking requirements for COVID 19. NECK: Supple. No thyromegaly. Trachea midline. No JVD.  No adenopathy. PULMONARY: Good air entry bilaterally.  Coarse breath sounds, some faint end expiratory wheezes, no rhonchi. CARDIOVASCULAR: S1 and S2. Regular rate and rhythm. No rubs, murmurs or gallops heard. ABDOMEN: Obese otherwise benign. MUSCULOSKELETAL: No joint deformity, no clubbing, no edema.  NEUROLOGIC: No focal deficits noted.  Speech is fluent, no gait disturbance. SKIN: Intact,warm,dry. PSYCH: Mood and behavior normal.      Assessment & Plan:     ICD-10-CM   1. Moderate persistent asthma without complication  J45.40 Pulmonary Function Test [facility] Only    Allergen Panel (27) + IGE   Patient needs to control of her symptoms Increase Symbicort to 160/4.5 2 puffs twice a day Add Spiriva 1.25 mcg 2 puffs daily Check PFTs/allergen panel  2. Shortness of breath  R06.02 Pulmonary Function Test [facility] Only    Allergen Panel (27) + IGE   PFTs Likely due to poorly controlled asthma May be aggravated by obesity  3. Allergic rhinitis due to other allergic trigger, unspecified seasonality  J30.89    Allergen panel Zyrtec 10 mg at bedtime  4. Second hand smoke exposure  Z77.22    This issue adds complexity to her management I have asked her to ask her so to smoke outside  5. Morbid obesity (HCC)  E66.01    This issue adds complexity to her management Obese individuals with asthma have more difficulty with control Obesity increases type  II inflammation   Orders Placed This Encounter  Procedures  . Allergen Panel (27) + IGE    Standing Status:   Future    Number of Occurrences:   1    Standing Expiration Date:   11/08/2021  . Pulmonary Function Test [facility] Only    Standing Status:   Future    Standing Expiration Date:   11/08/2021    Scheduling Instructions:     Next available.    Order Specific Question:   Full PFT: includes the following: basic spirometry, spirometry pre & post bronchodilator, diffusion capacity (DLCO), lung volumes    Answer:   Full PFT   Meds ordered this encounter  Medications  . Tiotropium Bromide Monohydrate (  SPIRIVA RESPIMAT) 1.25 MCG/ACT AERS    Sig: Inhale 2 puffs into the lungs daily for 1 day.    Dispense:  4 g    Refill:  0    Order Specific Question:   Lot Number?    Answer:   [id number]    Order Specific Question:   Expiration Date?    Answer:   12/30/2021    Order Specific Question:   Quantity    Answer:   2  . Tiotropium Bromide Monohydrate (SPIRIVA RESPIMAT) 1.25 MCG/ACT AERS    Sig: Inhale 2 puffs into the lungs daily.    Dispense:  4 g    Refill:  11  . budesonide-formoterol (SYMBICORT) 160-4.5 MCG/ACT inhaler    Sig: Inhale 2 puffs into the lungs 2 (two) times daily.    Dispense:  10.2 g    Refill:  11  . cetirizine (ZYRTEC) 10 MG tablet    Sig: Take 1 tablet (10 mg total) by mouth at bedtime.    Dispense:  30 tablet    Refill:  6   Discussion:  Patient symptoms are from poorly controlled asthma.  She has issues also associated with allergic rhinitis.  These individuals type II inflammation escalates and asthma becomes very difficult to control.  We will optimize her Symbicort to 160/4.5, 2 inhalations twice a day and add Spiriva Respimat 1.25 mcg, 2 inhalations daily.  He has also been advised to take her cetirizine daily.  Have recommended change in her home environment by not allowing cigarette smoke inside the home.  Her medications should be reviewed by her primary care  provider.  She is on propranolol and this medication is contraindicated in asthmatic individuals due to the fact that it is not a selective beta-blocker.  Recommend that this be switched to a more selective beta-blocker if continued use of beta-blocker is necessary.  We will see the patient in follow-up in 4 to 6 weeks time she is to contact us prior to that time should any new difficulties arise.   Gailen Shelter, MD [facility]   *This note was dictated using voice recognition software/Dragon.  Despite best efforts to proofread, errors can occur which can change the meaning.  Any change was purely unintentional.

## 2020-11-10 LAB — ALLERGEN PANEL (27) + IGE
Alternaria Alternata IgE: 0.42 kU/L — AB
Aspergillus Fumigatus IgE: 0.32 kU/L — AB
Bahia Grass IgE: 0.82 kU/L — AB
Bermuda Grass IgE: 0.2 kU/L — AB
Cat Dander IgE: 0.14 kU/L — AB
Cedar, Mountain IgE: 0.43 kU/L — AB
Cladosporium Herbarum IgE: 0.15 kU/L — AB
Cocklebur IgE: 1.37 kU/L — AB
Cockroach, American IgE: 0.11 kU/L — AB
Common Silver Birch IgE: 0.51 kU/L — AB
D Farinae IgE: 34.5 kU/L — AB
D Pteronyssinus IgE: 34.1 kU/L — AB
Dog Dander IgE: 5.09 kU/L — AB
Elm, American IgE: 1.36 kU/L — AB
Hickory, White IgE: 9.35 kU/L — AB
IgE (Immunoglobulin E), Serum: 843 IU/mL — ABNORMAL HIGH (ref 6–495)
Johnson Grass IgE: 0.21 kU/L — AB
Kentucky Bluegrass IgE: 32.4 kU/L — AB
Maple/Box Elder IgE: 0.25 kU/L — AB
Mucor Racemosus IgE: <0.1 kU/L
Oak, White IgE: 0.4 kU/L — AB
Penicillium Chrysogen IgE: 3.35 kU/L — AB
Pigweed, Rough IgE: 0.18 kU/L — AB
Plantain, English IgE: 0.35 kU/L — AB
Ragweed, Short IgE: 4.54 kU/L — AB
Setomelanomma Rostrat: 0.59 kU/L — AB
Timothy Grass IgE: 26.8 kU/L — AB
White Mulberry IgE: 0.15 kU/L — AB

## 2020-11-14 ENCOUNTER — Telehealth: Payer: Self-pay | Admitting: Pulmonary Disease

## 2020-11-14 DIAGNOSIS — Z9109 Other allergy status, other than to drugs and biological substances: Secondary | ICD-10-CM

## 2020-11-14 NOTE — Telephone Encounter (Signed)
Salena Saner, MD  11/14/2020 2:36 PM EST Back to Top    She has multiple allergies and would benefit from allergy evaluation.    Patient is aware of results and voiced her understanding. Patient is agreeable with allergy referral. Referral has been placed. Nothing further needed.

## 2020-12-01 ENCOUNTER — Telehealth: Payer: Self-pay

## 2020-12-01 NOTE — Telephone Encounter (Signed)
Patient is aware of date/time of covid test prior to PFT.  

## 2020-12-05 ENCOUNTER — Other Ambulatory Visit: Payer: Self-pay

## 2020-12-05 ENCOUNTER — Other Ambulatory Visit
Admission: RE | Admit: 2020-12-05 | Discharge: 2020-12-05 | Disposition: A | Discharge status: Home / Self Care | Payer: Medicaid Other | Source: Ambulatory Visit | Attending: Pulmonary Disease | Admitting: Pulmonary Disease

## 2020-12-05 DIAGNOSIS — Z20822 Contact with and (suspected) exposure to covid-19: Secondary | ICD-10-CM | POA: Insufficient documentation

## 2020-12-05 DIAGNOSIS — Z01812 Encounter for preprocedural laboratory examination: Secondary | ICD-10-CM | POA: Insufficient documentation

## 2020-12-06 ENCOUNTER — Ambulatory Visit: Payer: Medicaid Other | Attending: Pulmonary Disease

## 2020-12-06 DIAGNOSIS — J4540 Moderate persistent asthma, uncomplicated: Secondary | ICD-10-CM | POA: Insufficient documentation

## 2020-12-06 DIAGNOSIS — E669 Obesity, unspecified: Secondary | ICD-10-CM | POA: Diagnosis not present

## 2020-12-06 DIAGNOSIS — R0602 Shortness of breath: Secondary | ICD-10-CM | POA: Diagnosis present

## 2020-12-06 DIAGNOSIS — J984 Other disorders of lung: Secondary | ICD-10-CM | POA: Diagnosis not present

## 2020-12-06 DIAGNOSIS — Z7951 Long term (current) use of inhaled steroids: Secondary | ICD-10-CM | POA: Diagnosis not present

## 2020-12-06 LAB — SARS CORONAVIRUS 2 (TAT 6-24 HRS): SARS Coronavirus 2: NEGATIVE

## 2020-12-12 LAB — PULMONARY FUNCTION TEST ARMC ONLY
DL/VA % pred: 132 %
DL/VA: 5.8 ml/min/mmHg/L
DLCO unc % pred: 98 %
DLCO unc: 20.33 ml/min/mmHg
FEF 25-75 Post: 1.49 L/sec
FEF 25-75 Pre: 2.16 L/sec
FEF2575-%Change-Post: -31 %
FEF2575-%Pred-Post: 59 %
FEF2575-%Pred-Pre: 86 %
FEV1-%Change-Post: -8 %
FEV1-%Pred-Post: 72 %
FEV1-%Pred-Pre: 78 %
FEV1-Post: 1.66 L
FEV1-Pre: 1.81 L
FEV1FVC-%Change-Post: 2 %
FEV1FVC-%Pred-Pre: 103 %
FEV6-%Change-Post: -10 %
FEV6-%Pred-Post: 68 %
FEV6-%Pred-Pre: 77 %
FEV6-Post: 1.91 L
FEV6-Pre: 2.13 L
FEV6FVC-%Pred-Post: 102 %
FEV6FVC-%Pred-Pre: 102 %
FVC-%Change-Post: -10 %
FVC-%Pred-Post: 66 %
FVC-%Pred-Pre: 74 %
FVC-Post: 1.91 L
Post FEV1/FVC ratio: 87 %
Post FEV6/FVC ratio: 100 %
Pre FEV1/FVC ratio: 85 %
Pre FEV6/FVC Ratio: 100 %
RV % pred: 87 %
RV: 1.46 L
TLC % pred: 71 %
TLC: 3.49 L

## 2020-12-18 ENCOUNTER — Ambulatory Visit: Payer: Medicaid Other | Admitting: Pulmonary Disease

## 2021-07-10 ENCOUNTER — Emergency Department: Payer: Medicaid Other

## 2021-07-10 ENCOUNTER — Emergency Department
Admission: EM | Admit: 2021-07-10 | Discharge: 2021-07-11 | Disposition: A | Discharge status: Home / Self Care | Payer: Medicaid Other | Attending: Emergency Medicine | Admitting: Emergency Medicine

## 2021-07-10 ENCOUNTER — Encounter: Payer: Self-pay | Admitting: Emergency Medicine

## 2021-07-10 DIAGNOSIS — Z23 Encounter for immunization: Secondary | ICD-10-CM | POA: Diagnosis not present

## 2021-07-10 DIAGNOSIS — W231XXA Caught, crushed, jammed, or pinched between stationary objects, initial encounter: Secondary | ICD-10-CM | POA: Insufficient documentation

## 2021-07-10 DIAGNOSIS — J45909 Unspecified asthma, uncomplicated: Secondary | ICD-10-CM | POA: Diagnosis not present

## 2021-07-10 DIAGNOSIS — S61215A Laceration without foreign body of left ring finger without damage to nail, initial encounter: Secondary | ICD-10-CM

## 2021-07-10 DIAGNOSIS — S6992XA Unspecified injury of left wrist, hand and finger(s), initial encounter: Secondary | ICD-10-CM | POA: Diagnosis present

## 2021-07-10 MED ORDER — LIDOCAINE HCL (PF) 1 % IJ SOLN
10.0000 mL | Freq: Once | INTRAMUSCULAR | Status: AC
  Administered 2021-07-11: 10 mL
  Filled 2021-07-10: qty 10

## 2021-07-10 MED ORDER — TETANUS-DIPHTH-ACELL PERTUSSIS 5-2.5-18.5 LF-MCG/0.5 IM SUSY
0.5000 mL | PREFILLED_SYRINGE | Freq: Once | INTRAMUSCULAR | Status: AC
  Administered 2021-07-11: 0.5 mL via INTRAMUSCULAR
  Filled 2021-07-10: qty 0.5

## 2021-07-10 NOTE — ED Triage Notes (Addendum)
Pt arrived via [facility] from home where she got her finger caught in between the grill and porch railing, leaving approx 1 inch laceration to the side of left 4th finger. Bleeding controlled and new bandage applied.

## 2021-07-11 MED ORDER — SULFAMETHOXAZOLE-TRIMETHOPRIM 800-160 MG PO TABS: 1.0000 | ORAL_TABLET | Freq: Two times a day (BID) | ORAL | 0 refills | Status: DC

## 2021-07-11 NOTE — ED Provider Notes (Signed)
[facility] Emergency Department Provider Note  ____________________________________________  Time seen: Approximately 12:15 AM  I have reviewed the triage vital signs and the nursing notes.   HISTORY  Chief Complaint Finger Injury    HPI Sierra Wiley is a 50 y.o. female who presents the emergency department for evaluation of a laceration to the ring finger of the left hand.  Patient was helping move a grill on the grill caught her finger between the grill and the porch railing.  Patient sustained a laceration over the PIP joint.  Full range of motion is currently intact.  No loss of sensation.  No other injury or complaint.  Unsure of her last tetanus shot.       Past Medical History:  Diagnosis Date   Asthma    Bipolar 1 disorder (HCC)    Chronic back pain    Depression    Opiate use 02/21/2016   PTSD (post-traumatic stress disorder)    Seizures 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 Authorizing Provider  sulfamethoxazole-trimethoprim (BACTRIM DS) 800-160 MG tablet Take 1 tablet by mouth 2 (two) times daily. 07/11/21  Yes Earlisha Sharples, Delorise Royals, PA-C  albuterol (PROVENTIL HFA;VENTOLIN HFA) 108 (90 Base) MCG/ACT inhaler Inhale 2 puffs into the lungs every 6 (six) hours as needed for wheezing or shortness of breath.    [provider]  amLODipine (NORVASC) 2.5 MG tablet Take 2.5 mg by mouth daily.    [provider]  aripiprazole (ABILIFY) 10 MG disintegrating tablet Take 10 mg by mouth in the morning and at bedtime.     [provider]  budesonide-formoterol (SYMBICORT) 160-4.5 MCG/ACT inhaler Inhale 2 puffs into the lungs 2 (two) times daily. 11/08/20   Salena Saner, MD  cetirizine (ZYRTEC) 10 MG tablet Take 1 tablet (10 mg total) by mouth at bedtime. 11/08/20   Salena Saner, MD  citalopram (CELEXA) 10 MG tablet Take by mouth.    [provider]  clonazePAM (KLONOPIN) 0.5 MG tablet Take 1 tablet by mouth 2 (two) times daily. As needed Patient not taking: Reported on 11/08/2020    [provider]  fluticasone (FLONASE) 50 MCG/ACT nasal spray Place 1 spray into both nostrils 2 (two) times daily. 03/02/20   Tiani Stanbery, Delorise Royals, PA-C  gabapentin (NEURONTIN) 300 MG capsule Take 300 mg by mouth 2 (two) times a week. Patient not taking: Reported on 11/08/2020    [provider]  hydrOXYzine (ATARAX/VISTARIL) 10 MG tablet Take 10 mg by mouth every 6 (six) hours as needed.    [provider]  ibuprofen (ADVIL) 400 MG tablet Take 400 mg by mouth 3 (three) times daily as needed. 07/13/20   [provider]  lidocaine (LIDODERM) 5 % Place 1 patch onto the skin daily. Remove & Discard patch within 12 hours or as directed by MD    [provider]  liraglutide (VICTOZA) 18 MG/3ML SOPN Inject 0.6 mg into the skin daily.    [provider]  omeprazole (PRILOSEC)  10 MG capsule Take 1 capsule (10 mg total) by mouth daily. Patient not taking: Reported on 11/08/2020 03/02/20   Lonzell Dorris, Delorise RoyalsJonathan D, PA-C  oxybutynin (DITROPAN-XL) 10 MG 24 hr tablet Take 10 mg by mouth at bedtime. Patient not taking: Reported on 11/08/2020    [provider]  prazosin (MINIPRESS) 2 MG capsule Take 2 mg by mouth at bedtime.    [provider]  propranolol (INDERAL) 20 MG tablet Take 10 mg by mouth 2 (two) times daily. Patient not taking: Reported on 11/08/2020    [provider]  Tiotropium Bromide Monohydrate (SPIRIVA RESPIMAT) 1.25 MCG/ACT  AERS Inhale 2 puffs into the lungs daily for 1 day. 11/08/20 11/09/20  Salena SanerGonzalez, Carmen L, MD  Tiotropium Bromide Monohydrate (SPIRIVA RESPIMAT) 1.25 MCG/ACT AERS Inhale 2 puffs into the lungs daily. 11/28/20   Salena SanerGonzalez, Carmen L, MD  tiZANidine (ZANAFLEX) 4 MG capsule Take by mouth.    [provider]  traZODone (DESYREL) 100 MG tablet Take 150-300 mg by mouth at bedtime. Patient not taking: Reported on 11/08/2020    [provider]  vitamin B-12 (CYANOCOBALAMIN) 1000 MCG tablet Take 1,000 mcg by mouth daily.    [provider]    Allergies Penicillins  Family History  Problem Relation Age of Onset   Alcohol abuse Mother    Drug abuse Mother    HIV Mother    Bipolar disorder Mother    Cirrhosis Mother    Cancer Father    Heart disease Father    Drug abuse Sister    Alcohol abuse Sister     Social History Social History   Tobacco Use   Smoking status: Never   Smokeless tobacco: Never  Vaping Use   Vaping Use: Never used  Substance Use Topics   Alcohol use: No    Alcohol/week: 0.0 standard drinks   Drug use: No     Review of Systems  Constitutional: No fever/chills Eyes: No visual changes. No discharge ENT: No upper respiratory complaints. Cardiovascular: no chest pain. Respiratory: no cough. No SOB. Gastrointestinal: No abdominal pain.  No nausea, no vomiting.  No diarrhea.  No constipation. Musculoskeletal: Laceration to the ring finger of the left hand Skin: Negative for rash, abrasions, lacerations, ecchymosis. Neurological: Negative for headaches, focal weakness or numbness.  10 System ROS otherwise negative.  ____________________________________________   PHYSICAL EXAM:  VITAL SIGNS: ED Triage Vitals  Enc Vitals Group     BP 07/10/21 2009 (!) 120/106     Pulse Rate 07/10/21 2009 87     Resp 07/10/21 2009 18     Temp 07/10/21 2009 98.9 F (37.2 C)     Temp Source 07/10/21 2009 Oral     SpO2 07/10/21 2009 98 %     Weight  07/10/21 2010 219 lb (99.3 kg)     Height 07/10/21 2010 5\' 4"  (1.626 m)     Head Circumference --      Peak Flow --      Pain Score --      Pain Loc --      Pain Edu? --      Excl. in GC? --      Constitutional: Alert and oriented. Well appearing and in no acute distress. Eyes: Conjunctivae are normal. PERRL. EOMI. Head: Atraumatic. ENT:      Ears:       Nose: No congestion/rhinnorhea.      Mouth/Throat: Mucous membranes are moist.  Neck: No stridor.    Cardiovascular:  Normal rate, regular rhythm. Normal S1 and S2.  Good peripheral circulation. Respiratory: Normal respiratory effort without tachypnea or retractions. Lungs CTAB. Good air entry to the bases with no decreased or absent breath sounds. Musculoskeletal: Full range of motion to all extremities. No gross deformities appreciated.  Visualization of the left hand reveals small laceration over the PIP joint of the left ring finger.  Smith edges.  No active bleeding.  Full range of motion.  Sensation capillary refill intact. Neurologic:  Normal speech and language. No gross focal neurologic deficits are appreciated.  Skin:  Skin is warm, dry and intact. No rash noted. Psychiatric: Mood and affect are normal. Speech and behavior are normal. Patient exhibits appropriate insight and judgement.   ____________________________________________   LABS (all labs ordered are listed, but only abnormal results are displayed)  Labs Reviewed - No data to display ____________________________________________  EKG   ____________________________________________  RADIOLOGY I personally viewed and evaluated these images as part of my medical decision making, as well as reviewing the written report by the radiologist.  ED Provider Interpretation: No acute fractures or retained foreign body  DG Hand Complete Left  Result Date: 07/10/2021 CLINICAL DATA:  Laceration left ring finger on a grill today. Initial encounter. EXAM: LEFT HAND -  COMPLETE 3+ VIEW COMPARISON:  None. FINDINGS: There is no evidence of fracture or dislocation. There is no evidence of arthropathy or other focal bone abnormality. Soft tissues are unremarkable. IMPRESSION: Negative exam. Electronically Signed   By: Drusilla Kanner M.D.   On: 07/10/2021 21:02    ____________________________________________    PROCEDURES  Procedure(s) performed:    Marland KitchenMarland KitchenLaceration Repair  Date/Time: 07/11/2021 12:37 AM Performed by: Racheal Patches, PA-C Authorized by: Racheal Patches, PA-C   Consent:    Consent obtained:  Verbal   Consent given by:  Patient   Risks discussed:  Infection and pain Universal protocol:    Procedure explained and questions answered to patient or proxy's satisfaction: yes     Immediately prior to procedure, a time out was called: yes     Patient identity confirmed:  Verbally with patient Anesthesia:    Anesthesia method:  Nerve block   Block location:  Ring finger L hand   Block needle gauge:  27 G   Block anesthetic:  Lidocaine 1% w/o epi   Block injection procedure:  Anatomic landmarks identified, introduced needle, negative aspiration for blood and incremental injection   Block outcome:  Anesthesia achieved Laceration details:    Location:  Finger   Finger location:  L ring finger   Length (cm):  2.5 Exploration:    Hemostasis achieved with:  Direct pressure   Imaging obtained: x-ray     Wound exploration: wound explored through full range of motion and entire depth of wound visualized     Wound extent: no foreign bodies/material noted and no underlying fracture noted     Contaminated: no   Treatment:    Area cleansed with:  Povidone-iodine and saline   Amount of cleaning:  Standard   Irrigation solution:  Sterile saline   Irrigation volume:  500 ml   Irrigation method:  Syringe   Debridement:  None Skin repair:    Repair method:  Sutures   Suture size:  4-0   Suture material:  Prolene   Suture technique:   Simple interrupted   Number of sutures:  4 Approximation:    Approximation:  Close Repair type:    Repair type:  Simple Post-procedure  details:    Dressing:  Open (no dressing)   Procedure completion:  Tolerated well, no immediate complications    Medications  Tdap (BOOSTRIX) injection 0.5 mL (0.5 mLs Intramuscular Given 07/11/21 0024)  lidocaine (PF) (XYLOCAINE) 1 % injection 10 mL (10 mLs Infiltration Given by Other 07/11/21 0023)     ____________________________________________   INITIAL IMPRESSION / ASSESSMENT AND PLAN / ED COURSE  Pertinent labs & imaging results that were available during my care of the patient were reviewed by me and considered in my medical decision making (see chart for details).  Review of the [place] CSRS was performed in accordance of the [facility] prior to dispensing any controlled drugs.           Patient's diagnosis is consistent with finger laceration.  Patient presented to the emergency department with a laceration to the ring finger of the left hand.  This was closed as described above with no complications.  No underlying fracture or retained foreign body.  Antibiotics prophylactically.  Tetanus shot updated today.  Follow-up with primary care in 1 week for suture removal..  Patient is given ED precautions to return to the ED for any worsening or new symptoms.     ____________________________________________  FINAL CLINICAL IMPRESSION(S) / ED DIAGNOSES  Final diagnoses:  Laceration of left ring finger without foreign body without damage to nail, initial encounter      NEW MEDICATIONS STARTED DURING THIS VISIT:  ED Discharge Orders          Ordered    sulfamethoxazole-trimethoprim (BACTRIM DS) 800-160 MG tablet  2 times daily        07/11/21 0016                This chart was dictated using voice recognition software/Dragon. Despite best efforts to proofread, errors can occur which can change the meaning. Any change was purely  unintentional.    Racheal Patches, PA-C 07/11/21 [id number]    Shaune Pollack, MD 07/12/21 [phone]

## 2021-08-26 ENCOUNTER — Emergency Department: Payer: Medicaid Other

## 2021-08-26 ENCOUNTER — Other Ambulatory Visit: Payer: Self-pay

## 2021-08-26 ENCOUNTER — Emergency Department
Admission: EM | Admit: 2021-08-26 | Discharge: 2021-08-27 | Disposition: A | Discharge status: Home / Self Care | Payer: Medicaid Other | Attending: Emergency Medicine | Admitting: Emergency Medicine

## 2021-08-26 DIAGNOSIS — R1010 Upper abdominal pain, unspecified: Secondary | ICD-10-CM

## 2021-08-26 DIAGNOSIS — J45909 Unspecified asthma, uncomplicated: Secondary | ICD-10-CM | POA: Insufficient documentation

## 2021-08-26 DIAGNOSIS — K219 Gastro-esophageal reflux disease without esophagitis: Secondary | ICD-10-CM

## 2021-08-26 DIAGNOSIS — R1011 Right upper quadrant pain: Secondary | ICD-10-CM | POA: Diagnosis present

## 2021-08-26 DIAGNOSIS — K5900 Constipation, unspecified: Secondary | ICD-10-CM

## 2021-08-26 DIAGNOSIS — K567 Ileus, unspecified: Secondary | ICD-10-CM | POA: Diagnosis not present

## 2021-08-26 DIAGNOSIS — Z7951 Long term (current) use of inhaled steroids: Secondary | ICD-10-CM | POA: Insufficient documentation

## 2021-08-26 LAB — URINALYSIS, COMPLETE (UACMP) WITH MICROSCOPIC
Bilirubin Urine: NEGATIVE
Glucose, UA: NEGATIVE mg/dL
Ketones, ur: NEGATIVE mg/dL
Leukocytes,Ua: NEGATIVE
Nitrite: NEGATIVE
Protein, ur: NEGATIVE mg/dL
Specific Gravity, Urine: 1.016 (ref 1.005–1.030)
pH: 7 (ref 5.0–8.0)

## 2021-08-26 LAB — COMPREHENSIVE METABOLIC PANEL
ALT: 106 U/L — ABNORMAL HIGH (ref 0–44)
AST: 64 U/L — ABNORMAL HIGH (ref 15–41)
Albumin: 3.9 g/dL (ref 3.5–5.0)
Alkaline Phosphatase: 282 U/L — ABNORMAL HIGH (ref 38–126)
Anion gap: 8 (ref 5–15)
BUN: 9 mg/dL (ref 6–20)
CO2: 25 mmol/L (ref 22–32)
Calcium: 9.4 mg/dL (ref 8.9–10.3)
Chloride: 103 mmol/L (ref 98–111)
Creatinine, Ser: 0.91 mg/dL (ref 0.44–1.00)
GFR, Estimated: >60 mL/min (ref >60)
Glucose, Bld: 107 mg/dL — ABNORMAL HIGH (ref 70–99)
Potassium: 3.1 mmol/L — ABNORMAL LOW (ref 3.5–5.1)
Sodium: 136 mmol/L (ref 135–145)
Total Bilirubin: 0.8 mg/dL (ref 0.3–1.2)
Total Protein: 7.7 g/dL (ref 6.5–8.1)

## 2021-08-26 LAB — CBC
HCT: 39.2 % (ref 36.0–46.0)
Hemoglobin: 13.2 g/dL (ref 12.0–15.0)
MCH: 29.8 pg (ref 26.0–34.0)
MCHC: 33.7 g/dL (ref 30.0–36.0)
MCV: 88.5 fL (ref 80.0–100.0)
Platelets: 396 10*3/uL (ref 150–400)
RBC: 4.43 MIL/uL (ref 3.87–5.11)
RDW: 14.6 % (ref 11.5–15.5)
WBC: 7.2 10*3/uL (ref 4.0–10.5)
nRBC: 0 % (ref 0.0–0.2)

## 2021-08-26 LAB — LIPASE, BLOOD: Lipase: 34 U/L (ref 11–51)

## 2021-08-26 NOTE — ED Triage Notes (Signed)
Pt in via [facility] from home with c/o ruq abd pain and nausea intermittently for a few months. Pt was given 4mg  of zofran orally. 178/99, HR 117, 100% RA.

## 2021-08-27 ENCOUNTER — Emergency Department: Payer: Medicaid Other

## 2021-08-27 ENCOUNTER — Encounter: Payer: Self-pay | Admitting: Radiology

## 2021-08-27 LAB — TSH: TSH: 1.836 u[IU]/mL (ref 0.350–4.500)

## 2021-08-27 LAB — TROPONIN I (HIGH SENSITIVITY): Troponin I (High Sensitivity): 4 ng/L (ref <18)

## 2021-08-27 LAB — T4, FREE: Free T4: 0.82 ng/dL (ref 0.61–1.12)

## 2021-08-27 LAB — PREGNANCY, URINE: Preg Test, Ur: NEGATIVE

## 2021-08-27 MED ORDER — LACTULOSE 10 GM/15ML PO SOLN: 20.0000 g | Freq: Every day | ORAL | 0 refills | Status: DC | PRN

## 2021-08-27 MED ORDER — IOHEXOL 350 MG/ML SOLN
100.0000 mL | Freq: Once | INTRAVENOUS | Status: AC | PRN
  Administered 2021-08-27: 100 mL via INTRAVENOUS

## 2021-08-27 MED ORDER — METOCLOPRAMIDE HCL 10 MG PO TABS: 10.0000 mg | ORAL_TABLET | Freq: Three times a day (TID) | ORAL | 0 refills | Status: DC | PRN

## 2021-08-27 MED ORDER — ONDANSETRON 4 MG PO TBDP: 4.0000 mg | ORAL_TABLET | Freq: Three times a day (TID) | ORAL | 0 refills | Status: DC | PRN

## 2021-08-27 MED ORDER — SODIUM CHLORIDE 0.9 % IV BOLUS
500.0000 mL | Freq: Once | INTRAVENOUS | Status: AC
  Administered 2021-08-27: 500 mL via INTRAVENOUS

## 2021-08-27 MED ORDER — MORPHINE SULFATE (PF) 4 MG/ML IV SOLN
4.0000 mg | Freq: Once | INTRAVENOUS | Status: AC
  Administered 2021-08-27: 4 mg via INTRAVENOUS
  Filled 2021-08-27: qty 1

## 2021-08-27 MED ORDER — ONDANSETRON HCL 4 MG/2ML IJ SOLN
4.0000 mg | Freq: Once | INTRAMUSCULAR | Status: AC
  Administered 2021-08-27: 4 mg via INTRAVENOUS
  Filled 2021-08-27: qty 2

## 2021-08-27 MED ORDER — SUCRALFATE 1 GM/10ML PO SUSP: 1.0000 g | Freq: Four times a day (QID) | ORAL | 1 refills | Status: DC

## 2021-08-27 MED ORDER — FAMOTIDINE IN NACL 20-0.9 MG/50ML-% IV SOLN
20.0000 mg | Freq: Once | INTRAVENOUS | Status: AC
  Administered 2021-08-27: 20 mg via INTRAVENOUS
  Filled 2021-08-27: qty 50

## 2021-08-27 MED ORDER — FAMOTIDINE 20 MG PO TABS: 20.0000 mg | ORAL_TABLET | Freq: Two times a day (BID) | ORAL | 0 refills | Status: DC

## 2021-08-27 NOTE — Discharge Instructions (Addendum)
1.  Start Pepcid twice daily and Carafate 4 times daily as prescribed. 2.  You may take Reglan as needed for nausea. 3.  Take Lactulose as needed for bowel movements. 4.  Speak with your GI doctor about having an outpatient MRI to evaluate your bile duct. 5.  Return to the ER for worsening symptoms, persistent vomiting, difficulty breathing or other concerns.

## 2021-08-27 NOTE — ED Provider Notes (Signed)
[facility] Emergency Department Provider Note   ____________________________________________   Event Date/Time   First MD Initiated Contact with Patient 08/27/21 0012     (approximate)  I have reviewed the triage vital signs and the nursing notes.   HISTORY  Chief Complaint Abdominal Pain    HPI Sierra Wiley is a 50 y.o. female brought to the ED via EMS from home with a chief complaint of right upper quadrant abdominal pain and nausea.  Patient is status postcholecystectomy, currently being evaluated for gastric bypass and told she has elevated liver enzymes.  Reports symptoms times several months, worse over the past week.  Exacerbated by food.  Associated with nausea, no vomiting.  Denies fever, cough, chest pain, shortness of breath, dysuria or diarrhea.     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 Authorizing Provider  famotidine (PEPCID) 20 MG tablet Take 1 tablet (20 mg total) by mouth 2 (two) times daily. 08/27/21  Yes Irean HongSung, Jaymee Tilson J, MD  lactulose (CHRONULAC) 10 GM/15ML solution Take 30 mLs (20 g total) by mouth daily as needed for mild constipation. 08/27/21  Yes Irean HongSung, Lakeena Downie J, MD  metoCLOPramide (REGLAN) 10 MG tablet Take 1 tablet (10 mg total) by mouth every 8 (eight) hours as needed for nausea. 08/27/21  Yes Irean HongSung, Sofiya Ezelle J, MD  sucralfate (CARAFATE)  1 GM/10ML suspension Take 10 mLs (1 g total) by mouth 4 (four) times daily. 08/27/21  Yes Irean HongSung, Crystalmarie Yasin J, MD  albuterol (PROVENTIL HFA;VENTOLIN HFA) 108 (90 Base) MCG/ACT inhaler Inhale 2 puffs into the lungs every 6 (six) hours as needed for wheezing or shortness of breath.    [provider]  amLODipine (NORVASC) 2.5 MG tablet Take 2.5 mg by mouth daily.    [provider]  aripiprazole (ABILIFY) 10 MG disintegrating tablet Take 10 mg by mouth in the morning and at bedtime.    [provider]  budesonide-formoterol (SYMBICORT) 160-4.5 MCG/ACT inhaler Inhale 2 puffs into the lungs 2 (two) times daily. 11/08/20   Salena SanerGonzalez, Carmen L, MD  cetirizine (ZYRTEC) 10 MG tablet Take 1 tablet (10 mg total) by mouth at bedtime. 11/08/20   Salena SanerGonzalez, Carmen L, MD  citalopram (CELEXA) 10 MG tablet Take by mouth.    [provider]  clonazePAM (KLONOPIN) 0.5 MG tablet Take 1 tablet by mouth 2 (two) times daily. As needed Patient not taking: Reported on 11/08/2020    [provider]  fluticasone (FLONASE) 50 MCG/ACT nasal spray Place 1 spray into both nostrils 2 (two) times daily. 03/02/20   Cuthriell, Delorise RoyalsJonathan D, PA-C  gabapentin (NEURONTIN) 300 MG capsule Take 300 mg by mouth 2 (two) times a week. Patient not taking: Reported on 11/08/2020    [provider]  hydrOXYzine (ATARAX/VISTARIL) 10  MG tablet Take 10 mg by mouth every 6 (six) hours as needed.    [provider]  ibuprofen (ADVIL) 400 MG tablet Take 400 mg by mouth 3 (three) times daily as needed. 07/13/20   [provider]  lidocaine (LIDODERM) 5 % Place 1 patch onto the skin daily. Remove & Discard patch within 12 hours or as directed by MD    [provider]  liraglutide (VICTOZA) 18 MG/3ML SOPN Inject 0.6 mg into the skin daily.    [provider]  omeprazole (PRILOSEC) 10 MG capsule Take 1 capsule (10 mg total) by mouth daily. Patient not taking: Reported on  11/08/2020 03/02/20   Cuthriell, Delorise Royals, PA-C  oxybutynin (DITROPAN-XL) 10 MG 24 hr tablet Take 10 mg by mouth at bedtime. Patient not taking: Reported on 11/08/2020    [provider]  prazosin (MINIPRESS) 2 MG capsule Take 2 mg by mouth at bedtime.    [provider]  propranolol (INDERAL) 20 MG tablet Take 10 mg by mouth 2 (two) times daily. Patient not taking: Reported on 11/08/2020    [provider]  sulfamethoxazole-trimethoprim (BACTRIM DS) 800-160 MG tablet Take 1 tablet by mouth 2 (two) times daily. 07/11/21   Cuthriell, Delorise Royals, PA-C  Tiotropium Bromide Monohydrate (SPIRIVA RESPIMAT) 1.25 MCG/ACT AERS Inhale 2 puffs into the lungs daily for 1 day. 11/08/20 11/09/20  Salena Saner, MD  Tiotropium Bromide Monohydrate (SPIRIVA RESPIMAT) 1.25 MCG/ACT AERS Inhale 2 puffs into the lungs daily. 11/28/20   Salena Saner, MD  tiZANidine (ZANAFLEX) 4 MG capsule Take by mouth.    [provider]  traZODone (DESYREL) 100 MG tablet Take 150-300 mg by mouth at bedtime. Patient not taking: Reported on 11/08/2020    [provider]  vitamin B-12 (CYANOCOBALAMIN) 1000 MCG tablet Take 1,000 mcg by mouth daily.    [provider]    Allergies Penicillins  Family History  Problem Relation Age of Onset   Alcohol abuse Mother    Drug abuse Mother    HIV Mother    Bipolar disorder Mother    Cirrhosis Mother    Cancer Father    Heart disease Father    Drug abuse Sister    Alcohol abuse Sister     Social History Social History   Tobacco Use   Smoking status: Never   Smokeless tobacco: Never  Vaping Use   Vaping Use: Never used  Substance Use Topics   Alcohol use: No    Alcohol/week: 0.0 standard drinks   Drug use: No    Review of Systems  Constitutional: No fever/chills Eyes: No visual changes. ENT: No sore throat. Cardiovascular: Denies chest pain. Respiratory: Denies shortness of breath. Gastrointestinal:  Positive for abdominal pain and nausea, no vomiting.  No diarrhea.  No constipation. Genitourinary: Negative for dysuria. Musculoskeletal: Negative for back pain. Skin: Negative for rash. Neurological: Negative for headaches, focal weakness or numbness.   ____________________________________________   PHYSICAL EXAM:  VITAL SIGNS: ED Triage Vitals  Enc Vitals Group     BP 08/26/21 1754 (!) 151/91     Pulse Rate 08/26/21 1754 86     Resp 08/26/21 1754 18     Temp 08/26/21 1754 99.1 F (37.3 C)     Temp Source 08/26/21 1754 Oral     SpO2 08/26/21 1754 98 %     Weight 08/26/21 1755 226 lb (102.5 kg)     Height 08/26/21 1755 5\' 4"  (1.626 m)  Head Circumference --      Peak Flow --      Pain Score 08/26/21 1755 7     Pain Loc --      Pain Edu? --      Excl. in GC? --     Constitutional: Alert and oriented. Well appearing and in no acute distress. Eyes: Conjunctivae are normal. PERRL. EOMI. Head: Atraumatic. Nose: No congestion/rhinnorhea. Mouth/Throat: Mucous membranes are moist.   Neck: No stridor.   Cardiovascular: Normal rate, regular rhythm. Grossly normal heart sounds.  Good peripheral circulation. Respiratory: Normal respiratory effort.  No retractions. Lungs CTAB. Gastrointestinal: Soft and mildly tender to palpation right upper quadrant without rebound or guarding. No distention. No abdominal bruits. No CVA tenderness. Musculoskeletal: No lower extremity tenderness nor edema.  No joint effusions. Neurologic:  Normal speech and language. No gross focal neurologic deficits are appreciated. No gait instability. Skin:  Skin is warm, dry and intact. No rash noted. Psychiatric: Mood and affect are normal. Speech and behavior are normal.  ____________________________________________   LABS (all labs ordered are listed, but only abnormal results are displayed)  Labs Reviewed  COMPREHENSIVE METABOLIC PANEL - Abnormal; Notable for the following components:      Result  Value   Potassium 3.1 (*)    Glucose, Bld 107 (*)    AST 64 (*)    ALT 106 (*)    Alkaline Phosphatase 282 (*)    All other components within normal limits  URINALYSIS, COMPLETE (UACMP) WITH MICROSCOPIC - Abnormal; Notable for the following components:   Color, Urine YELLOW (*)    APPearance CLEAR (*)    Hgb urine dipstick LARGE (*)    Bacteria, UA RARE (*)    All other components within normal limits  LIPASE, BLOOD  CBC  PREGNANCY, URINE  TSH  T4, FREE  TROPONIN I (HIGH SENSITIVITY)   ____________________________________________  EKG  ED ECG REPORT I, Jeyren Danowski J, the attending physician, personally viewed and interpreted this ECG.   Date: 08/27/2021  EKG Time: 1800  Rate: 95  Rhythm: normal sinus rhythm  Axis: Normal  Intervals:none  ST&T Change: Nonspecific  ____________________________________________  RADIOLOGY I, Abeer Iversen J, personally viewed and evaluated these images (plain radiographs) as part of my medical decision making, as well as reviewing the written report by the radiologist.  ED MD interpretation: Status postcholecystectomy, otherwise unremarkable; CT demonstrates ileus versus increased transit time versus small intestinal bacterial overgrowth, less likely obstruction, outpatient MRCP recommended for increased CBD without stone  Official radiology report(s): CT Abdomen Pelvis W Contrast  Result Date: 08/27/2021 CLINICAL DATA:  Nausea vomiting and abdominal pain. EXAM: CT ABDOMEN AND PELVIS WITH CONTRAST TECHNIQUE: Multidetector CT imaging of the abdomen and pelvis was performed using the standard protocol following bolus administration of intravenous contrast. CONTRAST:  OMNIPAQUE IOHEXOL 350 MG/ML SOLN COMPARISON:  CT abdomen pelvis dated 01/31/2018. FINDINGS: Lower chest: The visualized lung bases are clear. No intra-abdominal free air or free fluid. Hepatobiliary: Mild fatty liver. There is biliary ductal dilatation, likely post cholecystectomy.  No retained calcified stone noted in the central CBD. The common bile duct measures up to 13 mm in diameter and significantly increased since the study of 2019. This can be better evaluated with MRI/MRCP on a nonemergent/outpatient basis. Pancreas: Unremarkable. No pancreatic ductal dilatation or surrounding inflammatory changes. Spleen: Normal in size without focal abnormality. Adrenals/Urinary Tract: The adrenal glands unremarkable. The kidneys, visualized ureters, and urinary bladder appear unremarkable. Stomach/Bowel: There is moderate stool throughout  the colon. Several top-normal caliber fecalized loops of proximal small bowel in the upper abdomen measure up to 3 cm. Findings may represent an ileus or increased transit time or small intestinal bacterial overgrowth. An early obstruction is less likely but not entirely excluded. Clinical correlation recommended. The appendix is normal. Several small scattered distal colonic and sigmoid diverticula noted without active inflammation. Vascular/Lymphatic: The abdominal aorta and IVC are unremarkable. No pain venous gas. There is no adenopathy. Reproductive: The uterus is anteverted. An intrauterine device is noted. No adnexal masses. Other: None Musculoskeletal: Mild degenerative changes of the spine. No acute osseous pathology. IMPRESSION: 1. Several top-normal caliber fecalized loops of proximal small bowel in the upper abdomen may represent an ileus or increased transit time or small intestinal bacterial overgrowth. An early obstruction is less likely but not entirely excluded. Clinical correlation recommended. 2. Moderate colonic stool burden. Normal appendix. 3. Colonic diverticulosis. 4. Mild fatty liver. 5. Interval increase in the biliary ductal dilatation since the study of 2019. No retained calcified stone noted in the central CBD. This can be better evaluated with MRI/MRCP on a nonemergent/outpatient basis. Electronically Signed   By: Elgie Collard  M.D.   On: 08/27/2021 02:11   US ABDOMEN LIMITED RUQ (LIVER/GB)  Result Date: 08/26/2021 CLINICAL DATA:  Epigastric pain and nausea. EXAM: ULTRASOUND ABDOMEN LIMITED RIGHT UPPER QUADRANT COMPARISON:  Ultrasound dated 08/10/2009. FINDINGS: Gallbladder: Cholecystectomy. Common bile duct: Diameter: 1 cm Liver: The liver demonstrates a normal echogenicity. Portal vein is patent on color Doppler imaging with normal direction of blood flow towards the liver. Other: None. IMPRESSION: Cholecystectomy, otherwise unremarkable right upper quadrant ultrasound. Electronically Signed   By: Elgie Collard M.D.   On: 08/26/2021 23:47    ____________________________________________   PROCEDURES  Procedure(s) performed (including Critical Care):  Procedures   ____________________________________________   INITIAL IMPRESSION / ASSESSMENT AND PLAN / ED COURSE  As part of my medical decision making, I reviewed the following data within the electronic MEDICAL RECORD NUMBER Nursing notes reviewed and incorporated, Labs reviewed, EKG interpreted, Old chart reviewed, Radiograph reviewed, and Notes from prior ED visits     50 year old female presenting with a several month history of right upper quadrant abdominal pain and nausea. Differential diagnosis includes, but is not limited to, biliary disease (biliary colic, acute cholecystitis, cholangitis, choledocholithiasis, etc), intrathoracic causes for epigastric abdominal pain including ACS, gastritis, duodenitis, pancreatitis, small bowel or large bowel obstruction, abdominal aortic aneurysm, hernia, and ulcer(s).   Laboratory results notable for mild hypokalemia, mild elevation of LFTs.  Ultrasound unremarkable.  Patient also complains of occasional palpitations; will obtain thyroid panel and troponin.  We will proceed with CT abdomen/pelvis, administer IV morphine/Zofran/Pepcid and reassess.  Clinical Course as of 08/27/21 0531  Mon Aug 27, 2021  0320 Patient  resting in no acute distress, feeling better.  Updated her on CT imaging and troponin results.  It does not look like she currently takes anything for GERD.  Will place on Reglan, Sulcrafate, Pepcid and refer to GI for outpatient follow-up.  Strict return precautions given.  Patient verbalizes understanding and agrees with plan of care. [JS]    Clinical Course User Index [JS] Irean Hong, MD     ____________________________________________   FINAL CLINICAL IMPRESSION(S) / ED DIAGNOSES  Final diagnoses:  Pain of upper abdomen  Gastroesophageal reflux disease, unspecified whether esophagitis present  Ileus (HCC)  Constipation, unspecified constipation type     ED Discharge Orders  Ordered    lactulose (CHRONULAC) 10 GM/15ML solution  Daily PRN        08/27/21 0323    ondansetron (ZOFRAN ODT) 4 MG disintegrating tablet  Every 8 hours PRN,   Status:  Discontinued        08/27/21 0323    sucralfate (CARAFATE) 1 GM/10ML suspension  4 times daily        08/27/21 0323    famotidine (PEPCID) 20 MG tablet  2 times daily        08/27/21 0323    metoCLOPramide (REGLAN) 10 MG tablet  Every 8 hours PRN        08/27/21 0325             Note:  This document was prepared using Dragon voice recognition software and may include unintentional dictation errors.    Irean Hong, MD 08/27/21 [phone]

## 2021-08-28 ENCOUNTER — Other Ambulatory Visit: Payer: Self-pay | Admitting: Obstetrics

## 2021-08-28 DIAGNOSIS — R7989 Other specified abnormal findings of blood chemistry: Secondary | ICD-10-CM

## 2021-08-28 DIAGNOSIS — R945 Abnormal results of liver function studies: Secondary | ICD-10-CM

## 2021-09-05 ENCOUNTER — Other Ambulatory Visit: Payer: Self-pay

## 2021-09-05 ENCOUNTER — Ambulatory Visit
Admission: RE | Admit: 2021-09-05 | Discharge: 2021-09-05 | Disposition: A | Discharge status: Home / Self Care | Payer: Medicaid Other | Source: Ambulatory Visit | Attending: Obstetrics | Admitting: Obstetrics

## 2021-09-05 DIAGNOSIS — R945 Abnormal results of liver function studies: Secondary | ICD-10-CM | POA: Diagnosis not present

## 2021-09-05 DIAGNOSIS — R7989 Other specified abnormal findings of blood chemistry: Secondary | ICD-10-CM

## 2021-09-12 ENCOUNTER — Ambulatory Visit: Admission: RE | Admit: 2021-09-12 | Payer: Medicaid Other | Source: Ambulatory Visit

## 2021-09-23 ENCOUNTER — Other Ambulatory Visit: Payer: Self-pay

## 2021-09-23 ENCOUNTER — Emergency Department
Admission: EM | Admit: 2021-09-23 | Discharge: 2021-09-23 | Disposition: A | Discharge status: Home / Self Care | Payer: Medicaid Other | Attending: Emergency Medicine | Admitting: Emergency Medicine

## 2021-09-23 DIAGNOSIS — Z7951 Long term (current) use of inhaled steroids: Secondary | ICD-10-CM | POA: Diagnosis not present

## 2021-09-23 DIAGNOSIS — R1011 Right upper quadrant pain: Secondary | ICD-10-CM | POA: Diagnosis present

## 2021-09-23 DIAGNOSIS — K8050 Calculus of bile duct without cholangitis or cholecystitis without obstruction: Secondary | ICD-10-CM

## 2021-09-23 DIAGNOSIS — J45909 Unspecified asthma, uncomplicated: Secondary | ICD-10-CM | POA: Diagnosis not present

## 2021-09-23 LAB — URINALYSIS, COMPLETE (UACMP) WITH MICROSCOPIC
Bilirubin Urine: NEGATIVE
Glucose, UA: NEGATIVE mg/dL
Ketones, ur: 5 mg/dL — AB
Nitrite: NEGATIVE
Protein, ur: NEGATIVE mg/dL
Specific Gravity, Urine: 1.021 (ref 1.005–1.030)
pH: 5 (ref 5.0–8.0)

## 2021-09-23 LAB — COMPREHENSIVE METABOLIC PANEL WITH GFR
ALT: 179 U/L — ABNORMAL HIGH (ref 0–44)
AST: 102 U/L — ABNORMAL HIGH (ref 15–41)
Albumin: 4 g/dL (ref 3.5–5.0)
Alkaline Phosphatase: 317 U/L — ABNORMAL HIGH (ref 38–126)
Anion gap: 10 (ref 5–15)
BUN: 10 mg/dL (ref 6–20)
CO2: 25 mmol/L (ref 22–32)
Calcium: 9.6 mg/dL (ref 8.9–10.3)
Chloride: 103 mmol/L (ref 98–111)
Creatinine, Ser: 0.83 mg/dL (ref 0.44–1.00)
GFR, Estimated: >60 mL/min
Glucose, Bld: 83 mg/dL (ref 70–99)
Potassium: 3.4 mmol/L — ABNORMAL LOW (ref 3.5–5.1)
Sodium: 138 mmol/L (ref 135–145)
Total Bilirubin: 0.8 mg/dL (ref 0.3–1.2)
Total Protein: 7.8 g/dL (ref 6.5–8.1)

## 2021-09-23 LAB — LIPASE, BLOOD: Lipase: 33 U/L (ref 11–51)

## 2021-09-23 LAB — CBC
HCT: 38.4 % (ref 36.0–46.0)
Hemoglobin: 12.9 g/dL (ref 12.0–15.0)
MCH: 30.6 pg (ref 26.0–34.0)
MCHC: 33.6 g/dL (ref 30.0–36.0)
MCV: 91.2 fL (ref 80.0–100.0)
Platelets: 376 K/uL (ref 150–400)
RBC: 4.21 MIL/uL (ref 3.87–5.11)
RDW: 14.8 % (ref 11.5–15.5)
WBC: 8.2 K/uL (ref 4.0–10.5)
nRBC: 0 % (ref 0.0–0.2)

## 2021-09-23 NOTE — ED Notes (Signed)
Pt verbalized understanding of dc instructions. Will follow up at [facility]. Ambulated to lobby without difficulty.

## 2021-09-23 NOTE — ED Provider Notes (Signed)
Emergency Medicine Provider Triage Evaluation Note  Sierra Wiley , a 50 y.o. female  was evaluated in triage.  Pt complains of 5 years s/p cholecystectomy, with intermittent RUQ abdominal pain and nausea. She had recently confirmed choledocholithiasis on MRCP. She has been referred to [facility] GI medicine for October. She presents with persistent nausea and abdominal pain.   Review of Systems  Positive: RUQ abd pain, nausea Negative: CP,   Physical Exam  BP (!) 150/94   Pulse 96   Temp 98.7 F (37.1 C) (Oral)   Resp 18   Ht 5\' 4"  (1.626 m)   Wt 102.5 kg   SpO2 96%   BMI 38.79 kg/m  Gen:   Awake, no distress  NAD Resp:  Normal effort CTA MSK:   Moves extremities without difficulty  Other:  ABD: soft, non-tender  Medical Decision Making  Medically screening exam initiated at 4:35 PM.  Appropriate orders placed.  Brionne Schaller was informed that the remainder of the evaluation will be completed by another provider, this initial triage assessment does not replace that evaluation, and the importance of remaining in the ED until their evaluation is complete.  Patient with ED evaluation of RUQ abdominal pain.   , PA-C 09/23/21 1638    09/25/21, MD 09/23/21 [phone]

## 2021-09-23 NOTE — ED Provider Notes (Signed)
[facility] Emergency Department Provider Note  ____________________________________________   I have reviewed the triage vital signs and the nursing notes.   HISTORY  Chief Complaint Abdominal Pain   History limited by: Not Limited   HPI Jerre Diguglielmo is a 50 y.o. female who presents to the emergency department today because of concern for abdominal pain. Located in the epigastric and RUQ. It does come and go. Has been happening for a couple of months. The patient states that the pain is worse with eating. She was being worked up and had an MRCP performed at [facility] on 9/19 which was concerning for choledocholithiasis. Came into the emergency department today because of a severe episode of pain that occurred while she was at work.   Records reviewed. Per medical record review patient has a history of MRCP performed 6 days ago concerning for choledocholithiasis.   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 Authorizing Provider  albuterol (PROVENTIL HFA;VENTOLIN HFA) 108 (90 Base) MCG/ACT inhaler Inhale 2 puffs into the lungs every 6 (six) hours as needed for wheezing or shortness of breath.    [provider]  amLODipine (NORVASC) 2.5 MG tablet Take 2.5 mg by mouth daily.    [provider]  aripiprazole (ABILIFY) 10 MG disintegrating tablet Take 10  mg by mouth in the morning and at bedtime.    [provider]  budesonide-formoterol (SYMBICORT) 160-4.5 MCG/ACT inhaler Inhale 2 puffs into the lungs 2 (two) times daily. 11/08/20   Tyler Pita, MD  cetirizine (ZYRTEC) 10 MG tablet Take 1 tablet (10 mg total) by mouth at bedtime. 11/08/20   Tyler Pita, MD  citalopram (CELEXA) 10 MG tablet Take by mouth.    [provider]  clonazePAM (KLONOPIN) 0.5 MG tablet Take 1 tablet by mouth 2 (two) times daily. As needed Patient not taking: Reported on 11/08/2020    [provider]  famotidine (PEPCID) 20 MG tablet Take 1 tablet (20 mg total) by mouth 2 (two) times daily. 08/27/21   Paulette Blanch, MD  fluticasone (FLONASE) 50 MCG/ACT nasal spray Place 1 spray into both nostrils 2 (two) times daily. 03/02/20   Cuthriell, Charline Bills, PA-C  gabapentin (NEURONTIN) 300 MG capsule Take 300 mg by mouth 2 (two) times a week. Patient not taking: Reported on 11/08/2020    [provider]  hydrOXYzine (ATARAX/VISTARIL) 10 MG tablet Take 10 mg by mouth every 6 (six) hours as needed.    [provider]  ibuprofen (ADVIL) 400 MG tablet Take 400 mg by mouth 3 (three) times daily as needed. 07/13/20   [provider]  lactulose (CHRONULAC) 10 GM/15ML solution Take 30 mLs (20 g total) by mouth daily as needed for mild constipation. 08/27/21   Beather Arbour,  Gretchen Short, MD  lidocaine (LIDODERM) 5 % Place 1 patch onto the skin daily. Remove & Discard patch within 12 hours or as directed by MD    [provider]  liraglutide (VICTOZA) 18 MG/3ML SOPN Inject 0.6 mg into the skin daily.    [provider]  metoCLOPramide (REGLAN) 10 MG tablet Take 1 tablet (10 mg total) by mouth every 8 (eight) hours as needed for nausea. 08/27/21   Paulette Blanch, MD  omeprazole (PRILOSEC) 10 MG capsule Take 1 capsule (10 mg total) by mouth daily. Patient not taking: Reported on 11/08/2020 03/02/20   Cuthriell, Charline Bills, PA-C   oxybutynin (DITROPAN-XL) 10 MG 24 hr tablet Take 10 mg by mouth at bedtime. Patient not taking: Reported on 11/08/2020    [provider]  prazosin (MINIPRESS) 2 MG capsule Take 2 mg by mouth at bedtime.    [provider]  propranolol (INDERAL) 20 MG tablet Take 10 mg by mouth 2 (two) times daily. Patient not taking: Reported on 11/08/2020    [provider]  sucralfate (CARAFATE) 1 GM/10ML suspension Take 10 mLs (1 g total) by mouth 4 (four) times daily. 08/27/21   Paulette Blanch, MD  sulfamethoxazole-trimethoprim (BACTRIM DS) 800-160 MG tablet Take 1 tablet by mouth 2 (two) times daily. 07/11/21   Cuthriell, Charline Bills, PA-C  Tiotropium Bromide Monohydrate (SPIRIVA RESPIMAT) 1.25 MCG/ACT AERS Inhale 2 puffs into the lungs daily for 1 day. 11/08/20 11/09/20  Tyler Pita, MD  Tiotropium Bromide Monohydrate (SPIRIVA RESPIMAT) 1.25 MCG/ACT AERS Inhale 2 puffs into the lungs daily. 11/28/20   Tyler Pita, MD  tiZANidine (ZANAFLEX) 4 MG capsule Take by mouth.    [provider]  traZODone (DESYREL) 100 MG tablet Take 150-300 mg by mouth at bedtime. Patient not taking: Reported on 11/08/2020    [provider]  vitamin B-12 (CYANOCOBALAMIN) 1000 MCG tablet Take 1,000 mcg by mouth daily.    [provider]    Allergies Penicillins  Family History  Problem Relation Age of Onset   Alcohol abuse Mother    Drug abuse Mother    HIV Mother    Bipolar disorder Mother    Cirrhosis Mother    Cancer Father    Heart disease Father    Drug abuse Sister    Alcohol abuse Sister     Social History Social History   Tobacco Use   Smoking status: Never   Smokeless tobacco: Never  Vaping Use   Vaping Use: Never used  Substance Use Topics   Alcohol use: No    Alcohol/week: 0.0 standard drinks   Drug use: No    Review of Systems Constitutional: No fever/chills Eyes: No visual changes. ENT: No sore throat. Cardiovascular: Denies  chest pain. Respiratory: Denies shortness of breath. Gastrointestinal: Positive for abdominal pain. Genitourinary: Negative for dysuria. Musculoskeletal: Negative for back pain. Skin: Negative for rash. Neurological: Negative for headaches, focal weakness or numbness.  ____________________________________________   PHYSICAL EXAM:  VITAL SIGNS: ED Triage Vitals  Enc Vitals Group     BP 09/23/21 1622 (!) 150/94     Pulse Rate 09/23/21 1622 96     Resp 09/23/21 1622 18     Temp 09/23/21 1622 98.7 F (37.1 C)     Temp Source 09/23/21 1622 Oral     SpO2 09/23/21 1622 96 %     Weight 09/23/21 1623 226 lb (102.5 kg)     Height 09/23/21 1623 _0  (1.626  m)     Head Circumference --      Peak Flow --      Pain Score 09/23/21 1623 9   Constitutional: Alert and oriented.  Eyes: Conjunctivae are normal.  ENT      Head: Normocephalic and atraumatic.      Nose: No congestion/rhinnorhea.      Mouth/Throat: Mucous membranes are moist.      Neck: No stridor. Hematological/Lymphatic/Immunilogical: No cervical lymphadenopathy. Cardiovascular: Normal rate, regular rhythm.  No murmurs, rubs, or gallops.  Respiratory: Normal respiratory effort without tachypnea nor retractions. Breath sounds are clear and equal bilaterally. No wheezes/rales/rhonchi. Gastrointestinal: Soft and tender to palpation in the epigastric and RUQ. Genitourinary: Deferred Musculoskeletal: Normal range of motion in all extremities. No lower extremity edema. Neurologic:  Normal speech and language. No gross focal neurologic deficits are appreciated.  Skin:  Skin is warm, dry and intact. No rash noted. Psychiatric: Mood and affect are normal. Speech and behavior are normal. Patient exhibits appropriate insight and judgment.  ____________________________________________    LABS (pertinent positives/negatives)  Lipase 33 CBC wbc 8.2, hgb 12.9, plt 376 CMP k 3.4, ast 102, alt 179, alk phos  317  ____________________________________________   EKG  None  ____________________________________________    RADIOLOGY  None  ____________________________________________   PROCEDURES  Procedures  ____________________________________________   INITIAL IMPRESSION / ASSESSMENT AND PLAN / ED COURSE  Pertinent labs & imaging results that were available during my care of the patient were reviewed by me and considered in my medical decision making (see chart for details).   Patient presents to the emergency department today because of concern for continued abdominal pain and RUQ. The patient did have MRCP performed 6 days ago which was consistent with choledocholithiasis. Blood work today shows continued elevation of LFTs without any significant change. Bili within normal limits. Did discuss with [facility] about transfer to choledocholithiasis. Unfortunately no bed availability tonight for transfer. ERCP unavailable tomorrow at our facility here. However in discussion with physician at [facility] at this time will plan on discharging patient. [facility] will contact her tomorrow for scheduling ERCP. At this time I think that is reasonable. No suggestion of cholangitis at this time and given she can tolerate some PO and given that symptoms have been present for months do not think she requires emergent ERCP. Discussed plan with patient. Also discussed strict return precautions.   ____________________________________________   FINAL CLINICAL IMPRESSION(S) / ED DIAGNOSES  Final diagnoses:  RUQ pain  Choledocholithiasis     Note: This dictation was prepared with Dragon dictation. Any transcriptional errors that result from this process are unintentional     Nance Pear, MD 09/23/21 1929

## 2021-09-23 NOTE — ED Triage Notes (Signed)
FIRST NURSE NOTE:  Pt arrived via [facility] with reports of abd pain RUQ, had lap chole 5 years ago, recent MRI showed gallstone stuck in bile duct, unable to work because of pain  VSS with EMS. CBG 85

## 2021-09-23 NOTE — ED Triage Notes (Addendum)
See first nurse note- Pt reports RUQ pain that is intermittent and has been ongoing since her gallbladder removal 5 years ago. States this morning she was at work when the pain returned accompanied with nausea. Reports constant diarrhea.   Recently had a MRI which showed an obstructing 1.3cm gall stone in her bile duct. Reports having lab work drawn that showed elevated liver enzymes.

## 2021-09-23 NOTE — Discharge Instructions (Addendum)
Please seek medical attention for any high fevers, chest pain, shortness of breath, change in behavior, persistent vomiting, bloody stool or any other new or concerning symptoms.  

## 2021-10-03 ENCOUNTER — Encounter: Payer: Self-pay | Admitting: *Deleted

## 2021-10-19 NOTE — Progress Notes (Unsigned)
Pt did not show for scheduled appointment.  

## 2021-10-22 ENCOUNTER — Other Ambulatory Visit: Payer: Self-pay

## 2021-10-22 ENCOUNTER — Ambulatory Visit: Payer: Medicaid Other

## 2021-11-25 ENCOUNTER — Inpatient Hospital Stay
Admission: EM | Admit: 2021-11-25 | Discharge: 2021-11-28 | DRG: 392 | Discharge status: Against Medical Advice | Payer: Medicaid Other | Attending: Hospitalist | Admitting: Hospitalist

## 2021-11-25 ENCOUNTER — Other Ambulatory Visit: Payer: Self-pay

## 2021-11-25 ENCOUNTER — Encounter: Payer: Self-pay | Admitting: Emergency Medicine

## 2021-11-25 ENCOUNTER — Emergency Department: Payer: Medicaid Other

## 2021-11-25 DIAGNOSIS — N390 Urinary tract infection, site not specified: Secondary | ICD-10-CM | POA: Diagnosis present

## 2021-11-25 DIAGNOSIS — R0682 Tachypnea, not elsewhere classified: Secondary | ICD-10-CM | POA: Diagnosis present

## 2021-11-25 DIAGNOSIS — K529 Noninfective gastroenteritis and colitis, unspecified: Secondary | ICD-10-CM | POA: Diagnosis present

## 2021-11-25 DIAGNOSIS — K8591 Acute pancreatitis with uninfected necrosis, unspecified: Secondary | ICD-10-CM | POA: Diagnosis not present

## 2021-11-25 DIAGNOSIS — Z20822 Contact with and (suspected) exposure to covid-19: Secondary | ICD-10-CM | POA: Diagnosis present

## 2021-11-25 DIAGNOSIS — K8681 Exocrine pancreatic insufficiency: Secondary | ICD-10-CM | POA: Diagnosis present

## 2021-11-25 DIAGNOSIS — J9811 Atelectasis: Secondary | ICD-10-CM | POA: Diagnosis present

## 2021-11-25 DIAGNOSIS — R1110 Vomiting, unspecified: Secondary | ICD-10-CM | POA: Diagnosis not present

## 2021-11-25 DIAGNOSIS — R569 Unspecified convulsions: Secondary | ICD-10-CM

## 2021-11-25 DIAGNOSIS — I10 Essential (primary) hypertension: Secondary | ICD-10-CM

## 2021-11-25 DIAGNOSIS — D509 Iron deficiency anemia, unspecified: Secondary | ICD-10-CM | POA: Diagnosis present

## 2021-11-25 DIAGNOSIS — J45909 Unspecified asthma, uncomplicated: Secondary | ICD-10-CM | POA: Diagnosis present

## 2021-11-25 DIAGNOSIS — Z5329 Procedure and treatment not carried out because of patient's decision for other reasons: Secondary | ICD-10-CM | POA: Diagnosis not present

## 2021-11-25 DIAGNOSIS — A419 Sepsis, unspecified organism: Secondary | ICD-10-CM

## 2021-11-25 DIAGNOSIS — F319 Bipolar disorder, unspecified: Secondary | ICD-10-CM | POA: Diagnosis present

## 2021-11-25 DIAGNOSIS — Z8719 Personal history of other diseases of the digestive system: Secondary | ICD-10-CM

## 2021-11-25 DIAGNOSIS — R112 Nausea with vomiting, unspecified: Principal | ICD-10-CM | POA: Diagnosis present

## 2021-11-25 DIAGNOSIS — E876 Hypokalemia: Secondary | ICD-10-CM

## 2021-11-25 DIAGNOSIS — F3130 Bipolar disorder, current episode depressed, mild or moderate severity, unspecified: Secondary | ICD-10-CM | POA: Diagnosis present

## 2021-11-25 DIAGNOSIS — K859 Acute pancreatitis without necrosis or infection, unspecified: Secondary | ICD-10-CM | POA: Insufficient documentation

## 2021-11-25 LAB — COMPREHENSIVE METABOLIC PANEL
ALT: 27 U/L (ref 0–44)
AST: 21 U/L (ref 15–41)
Albumin: 3.1 g/dL — ABNORMAL LOW (ref 3.5–5.0)
Alkaline Phosphatase: 67 U/L (ref 38–126)
Anion gap: 9 (ref 5–15)
BUN: 6 mg/dL (ref 6–20)
CO2: 30 mmol/L (ref 22–32)
Calcium: 9.5 mg/dL (ref 8.9–10.3)
Chloride: 96 mmol/L — ABNORMAL LOW (ref 98–111)
Creatinine, Ser: 0.47 mg/dL (ref 0.44–1.00)
GFR, Estimated: >60 mL/min (ref >60)
Glucose, Bld: 148 mg/dL — ABNORMAL HIGH (ref 70–99)
Potassium: 3.2 mmol/L — ABNORMAL LOW (ref 3.5–5.1)
Sodium: 135 mmol/L (ref 135–145)
Total Bilirubin: 0.8 mg/dL (ref 0.3–1.2)
Total Protein: 8.5 g/dL — ABNORMAL HIGH (ref 6.5–8.1)

## 2021-11-25 LAB — CBC WITH DIFFERENTIAL/PLATELET
Abs Immature Granulocytes: 0.03 10*3/uL (ref 0.00–0.07)
Basophils Absolute: 0 10*3/uL (ref 0.0–0.1)
Basophils Relative: 0 %
Eosinophils Absolute: 0.1 10*3/uL (ref 0.0–0.5)
Eosinophils Relative: 1 %
HCT: 33 % — ABNORMAL LOW (ref 36.0–46.0)
Hemoglobin: 10.2 g/dL — ABNORMAL LOW (ref 12.0–15.0)
Immature Granulocytes: 0 %
Lymphocytes Relative: 24 %
Lymphs Abs: 2.5 10*3/uL (ref 0.7–4.0)
MCH: 26.7 pg (ref 26.0–34.0)
MCHC: 30.9 g/dL (ref 30.0–36.0)
MCV: 86.4 fL (ref 80.0–100.0)
Monocytes Absolute: 1 10*3/uL (ref 0.1–1.0)
Monocytes Relative: 10 %
Neutro Abs: 6.8 10*3/uL (ref 1.7–7.7)
Neutrophils Relative %: 65 %
Platelets: 569 10*3/uL — ABNORMAL HIGH (ref 150–400)
RBC: 3.82 MIL/uL — ABNORMAL LOW (ref 3.87–5.11)
RDW: 15 % (ref 11.5–15.5)
WBC: 10.4 10*3/uL (ref 4.0–10.5)
nRBC: 0 % (ref 0.0–0.2)

## 2021-11-25 LAB — MAGNESIUM: Magnesium: 1.4 mg/dL — ABNORMAL LOW (ref 1.7–2.4)

## 2021-11-25 LAB — RESP PANEL BY RT-PCR (FLU A&B, COVID) ARPGX2
Influenza A by PCR: NEGATIVE
Influenza B by PCR: NEGATIVE
SARS Coronavirus 2 by RT PCR: NEGATIVE

## 2021-11-25 LAB — URINALYSIS, ROUTINE W REFLEX MICROSCOPIC
Bilirubin Urine: NEGATIVE
Glucose, UA: NEGATIVE mg/dL
Ketones, ur: 40 mg/dL — AB
Leukocytes,Ua: NEGATIVE
Nitrite: POSITIVE — AB
Protein, ur: 100 mg/dL — AB
RBC / HPF: >50 RBC/hpf — ABNORMAL HIGH (ref 0–5)
Specific Gravity, Urine: <1.005 — ABNORMAL LOW (ref 1.005–1.030)
pH: 7 (ref 5.0–8.0)

## 2021-11-25 LAB — CBC
HCT: 29.5 % — ABNORMAL LOW (ref 36.0–46.0)
Hemoglobin: 9.1 g/dL — ABNORMAL LOW (ref 12.0–15.0)
MCH: 26.7 pg (ref 26.0–34.0)
MCHC: 30.8 g/dL (ref 30.0–36.0)
MCV: 86.5 fL (ref 80.0–100.0)
Platelets: 498 10*3/uL — ABNORMAL HIGH (ref 150–400)
RBC: 3.41 MIL/uL — ABNORMAL LOW (ref 3.87–5.11)
RDW: 15.2 % (ref 11.5–15.5)
WBC: 9 10*3/uL (ref 4.0–10.5)
nRBC: 0 % (ref 0.0–0.2)

## 2021-11-25 LAB — LIPASE, BLOOD: Lipase: 25 U/L (ref 11–51)

## 2021-11-25 MED ORDER — SODIUM CHLORIDE 0.9 % IV SOLN
1.0000 g | Freq: Once | INTRAVENOUS | Status: AC
  Administered 2021-11-25: 20:00:00 1 g via INTRAVENOUS
  Filled 2021-11-25: qty 10

## 2021-11-25 MED ORDER — POTASSIUM CHLORIDE 10 MEQ/100ML IV SOLN
10.0000 meq | Freq: Once | INTRAVENOUS | Status: AC
  Administered 2021-11-25: 19:00:00 10 meq via INTRAVENOUS
  Filled 2021-11-25: qty 100

## 2021-11-25 MED ORDER — ENOXAPARIN SODIUM 40 MG/0.4ML IJ SOSY: 40.0000 mg | PREFILLED_SYRINGE | INTRAMUSCULAR | Status: DC

## 2021-11-25 MED ORDER — ENOXAPARIN SODIUM 60 MG/0.6ML IJ SOSY
0.5000 mg/kg | PREFILLED_SYRINGE | INTRAMUSCULAR | Status: DC
  Administered 2021-11-25 – 2021-11-27 (×3): 45 mg via SUBCUTANEOUS
  Filled 2021-11-25: qty 0.6
  Filled 2021-11-25 (×2): qty 0.45

## 2021-11-25 MED ORDER — ARIPIPRAZOLE 10 MG PO TBDP: 10.0000 mg | ORAL_TABLET | Freq: Every day | ORAL | Status: DC

## 2021-11-25 MED ORDER — ALBUTEROL SULFATE HFA 108 (90 BASE) MCG/ACT IN AERS: 2.0000 | INHALATION_SPRAY | Freq: Four times a day (QID) | RESPIRATORY_TRACT | Status: DC | PRN

## 2021-11-25 MED ORDER — MAGNESIUM SULFATE 2 GM/50ML IV SOLN
2.0000 g | Freq: Once | INTRAVENOUS | Status: AC
  Administered 2021-11-25: 19:00:00 2 g via INTRAVENOUS
  Filled 2021-11-25: qty 50

## 2021-11-25 MED ORDER — AMLODIPINE BESYLATE 5 MG PO TABS
2.5000 mg | ORAL_TABLET | Freq: Every day | ORAL | Status: DC
  Administered 2021-11-26 – 2021-11-27 (×2): 2.5 mg via ORAL
  Filled 2021-11-25: qty 0.5
  Filled 2021-11-25: qty 1
  Filled 2021-11-25 (×3): qty 0.5

## 2021-11-25 MED ORDER — ALBUTEROL SULFATE (2.5 MG/3ML) 0.083% IN NEBU: 2.5000 mg | INHALATION_SOLUTION | Freq: Four times a day (QID) | RESPIRATORY_TRACT | Status: DC | PRN

## 2021-11-25 MED ORDER — ACETAMINOPHEN 325 MG PO TABS
650.0000 mg | ORAL_TABLET | Freq: Four times a day (QID) | ORAL | Status: DC | PRN
  Filled 2021-11-25 (×2): qty 2

## 2021-11-25 MED ORDER — IOHEXOL 300 MG/ML  SOLN
100.0000 mL | Freq: Once | INTRAMUSCULAR | Status: AC | PRN
  Administered 2021-11-25: 18:00:00 100 mL via INTRAVENOUS

## 2021-11-25 MED ORDER — SODIUM CHLORIDE 0.9 % IV SOLN: Freq: Once | INTRAVENOUS | Status: AC

## 2021-11-25 MED ORDER — ONDANSETRON HCL 4 MG PO TABS
4.0000 mg | ORAL_TABLET | Freq: Four times a day (QID) | ORAL | Status: DC | PRN
  Administered 2021-11-26: 14:00:00 4 mg via ORAL
  Filled 2021-11-25: qty 1

## 2021-11-25 MED ORDER — ONDANSETRON HCL 4 MG/2ML IJ SOLN
4.0000 mg | Freq: Four times a day (QID) | INTRAMUSCULAR | Status: DC | PRN
  Administered 2021-11-25 – 2021-11-28 (×5): 4 mg via INTRAVENOUS
  Filled 2021-11-25 (×5): qty 2

## 2021-11-25 MED ORDER — HYDROCODONE-ACETAMINOPHEN 5-325 MG PO TABS: 1.0000 | ORAL_TABLET | ORAL | Status: DC | PRN

## 2021-11-25 MED ORDER — ACETAMINOPHEN 650 MG RE SUPP: 650.0000 mg | Freq: Four times a day (QID) | RECTAL | Status: DC | PRN

## 2021-11-25 MED ORDER — MORPHINE SULFATE (PF) 2 MG/ML IV SOLN: 2.0000 mg | INTRAVENOUS | Status: DC | PRN

## 2021-11-25 MED ORDER — KCL IN DEXTROSE-NACL 40-5-0.9 MEQ/L-%-% IV SOLN
INTRAVENOUS | Status: DC
  Filled 2021-11-25 (×5): qty 1000

## 2021-11-25 MED ORDER — SODIUM CHLORIDE 0.9 % IV BOLUS
1000.0000 mL | Freq: Once | INTRAVENOUS | Status: AC
  Administered 2021-11-25: 19:00:00 1000 mL via INTRAVENOUS

## 2021-11-25 MED ORDER — PANTOPRAZOLE SODIUM 40 MG IV SOLR
40.0000 mg | INTRAVENOUS | Status: DC
  Administered 2021-11-25 – 2021-11-27 (×3): 40 mg via INTRAVENOUS
  Filled 2021-11-25 (×3): qty 40

## 2021-11-25 NOTE — ED Notes (Signed)
Pt denies pain/nausea.

## 2021-11-25 NOTE — ED Provider Notes (Signed)
[facility] Emergency Department Provider Note  ____________________________________________   I have reviewed the triage vital signs and the nursing notes.   HISTORY  Chief Complaint Abdominal Pain and Vomiting   History limited by: Not Limited   HPI Sierra Wiley is a 50 y.o. female who presents to the emergency department today because of concerns for nausea and vomiting.  Patient states that since her recent hospitalization she has been vomiting a couple of times a day.  However starting the yesterday she developed more profound vomiting.  She was unable to keep down any p.o.  Initially she was having yellow vomitus but now it is more green.  She has not had any diarrhea although has been passing a lot of gas and feels bloated.  No significant abdominal pain.  She denies any chills or fevers.  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  Records reviewed. Per medical record review patient has a history of prolonged admission to [facility] with discharge roughly 10 days ago.   Past Medical History:  Diagnosis Date   Asthma    Bipolar 1 disorder ([facility])    Chronic back pain    Depression    Opiate use 02/21/2016   PTSD (post-traumatic stress disorder)    Seizures [facility])     Patient Active Problem List   Diagnosis Date Noted   Tremor due to drug withdrawal ([facility]) 10/07/2016   Bipolar 1 disorder, depressed ([facility]) 09/05/2016   Chronic pain 02/21/2016   Asthma 11/06/2015   Post traumatic stress disorder 07/01/2015    Past Surgical History:  Procedure Laterality Date   CHOLECYSTECTOMY     ELBOW SURGERY Left 2000   TUBAL LIGATION      Prior to Admission medications   Medication Sig Start Date End Date Taking? Authorizing Provider  albuterol (PROVENTIL HFA;VENTOLIN HFA) 108 (90 Base) MCG/ACT inhaler Inhale 2 puffs into the lungs every 6 (six) hours as needed for  wheezing or shortness of breath.    [provider]  amLODipine (NORVASC) 2.5 MG tablet Take 2.5 mg by mouth daily.    [provider]  aripiprazole (ABILIFY) 10 MG disintegrating tablet Take 10 mg by mouth in the morning and at bedtime.    [provider]  budesonide-formoterol (SYMBICORT) 160-4.5 MCG/ACT inhaler Inhale 2 puffs into the lungs 2 (two) times daily. 11/08/20   Salena Saner, MD  cetirizine (ZYRTEC) 10 MG tablet Take 1 tablet (10 mg total) by mouth at bedtime. 11/08/20   Salena Saner, MD  citalopram (CELEXA) 10 MG tablet Take by mouth.    [provider]  clonazePAM (KLONOPIN) 0.5 MG tablet Take 1 tablet by mouth 2 (two) times daily. As needed Patient not taking: Reported on 11/08/2020    [provider]  famotidine (PEPCID) 20 MG tablet Take 1 tablet (20 mg total) by mouth 2 (two) times daily. 08/27/21   Irean Hong, MD  fluticasone (FLONASE) 50 MCG/ACT nasal spray Place 1 spray into both nostrils 2 (two) times daily. 03/02/20   Cuthriell, Delorise Royals, PA-C  gabapentin (NEURONTIN) 300 MG capsule Take 300 mg by mouth 2 (two) times a week. Patient not taking: Reported on 11/08/2020    [provider]  hydrOXYzine (ATARAX/VISTARIL) 10 MG tablet Take 10 mg by mouth every 6 (six) hours as needed.    [provider]  ibuprofen (ADVIL) 400 MG tablet Take 400 mg by mouth 3 (three)  times daily as needed. 07/13/20   [provider]  lactulose (CHRONULAC) 10 GM/15ML solution Take 30 mLs (20 g total) by mouth daily as needed for mild constipation. 08/27/21   Irean Hong, MD  lidocaine (LIDODERM) 5 % Place 1 patch onto the skin daily. Remove & Discard patch within 12 hours or as directed by MD    [provider]  liraglutide (VICTOZA) 18 MG/3ML SOPN Inject 0.6 mg into the skin daily.    [provider]  metoCLOPramide (REGLAN) 10 MG tablet Take 1 tablet (10 mg total) by mouth every 8 (eight) hours as  needed for nausea. 08/27/21   Irean Hong, MD  omeprazole (PRILOSEC) 10 MG capsule Take 1 capsule (10 mg total) by mouth daily. Patient not taking: Reported on 11/08/2020 03/02/20   Cuthriell, Delorise Royals, PA-C  oxybutynin (DITROPAN-XL) 10 MG 24 hr tablet Take 10 mg by mouth at bedtime. Patient not taking: Reported on 11/08/2020    [provider]  prazosin (MINIPRESS) 2 MG capsule Take 2 mg by mouth at bedtime.    [provider]  propranolol (INDERAL) 20 MG tablet Take 10 mg by mouth 2 (two) times daily. Patient not taking: Reported on 11/08/2020    [provider]  sucralfate (CARAFATE) 1 GM/10ML suspension Take 10 mLs (1 g total) by mouth 4 (four) times daily. 08/27/21   Irean Hong, MD  sulfamethoxazole-trimethoprim (BACTRIM DS) 800-160 MG tablet Take 1 tablet by mouth 2 (two) times daily. 07/11/21   Cuthriell, Delorise Royals, PA-C  Tiotropium Bromide Monohydrate (SPIRIVA RESPIMAT) 1.25 MCG/ACT AERS Inhale 2 puffs into the lungs daily for 1 day. 11/08/20 11/09/20  Salena Saner, MD  Tiotropium Bromide Monohydrate (SPIRIVA RESPIMAT) 1.25 MCG/ACT AERS Inhale 2 puffs into the lungs daily. 11/28/20   Salena Saner, MD  tiZANidine (ZANAFLEX) 4 MG capsule Take by mouth.    [provider]  traZODone (DESYREL) 100 MG tablet Take 150-300 mg by mouth at bedtime. Patient not taking: Reported on 11/08/2020    [provider]  vitamin B-12 (CYANOCOBALAMIN) 1000 MCG tablet Take 1,000 mcg by mouth daily.    [provider]    Allergies Penicillins  Family History  Problem Relation Age of Onset   Alcohol abuse Mother    Drug abuse Mother    HIV Mother    Bipolar disorder Mother    Cirrhosis Mother    Cancer Father    Heart disease Father    Drug abuse Sister    Alcohol abuse Sister     Social History Social History   Tobacco Use   Smoking status: Never   Smokeless tobacco: Never  Vaping Use   Vaping Use: Never used  Substance Use  Topics   Alcohol use: No    Alcohol/week: 0.0 standard drinks   Drug use: No    Review of Systems Constitutional: No fever/chills. Positive for weakness. Eyes: No visual changes. ENT: No sore throat. Cardiovascular: Denies chest pain. Respiratory: Denies shortness of breath. Gastrointestinal: Positive for nausea and vomiting.  Genitourinary: Negative for dysuria. Musculoskeletal: Negative for back pain. Skin: Negative for rash. Neurological: Negative for headaches, focal weakness or numbness.  ____________________________________________   PHYSICAL EXAM:  VITAL SIGNS: ED Triage Vitals  Enc Vitals Group     BP 11/25/21 1217 132/84     Pulse Rate 11/25/21 1217 98     Resp 11/25/21 1217 20     Temp 11/25/21 1217 98.6 F (37 C)  Temp Source 11/25/21 1217 Oral     SpO2 11/25/21 1217 100 %     Weight 11/25/21 1218 193 lb (87.5 kg)     Height 11/25/21 1218 5\' 4"  (1.626 m)     Head Circumference --      Peak Flow --      Pain Score 11/25/21 1218 5     Pain Loc --     Constitutional: Alert and oriented.  Eyes: Conjunctivae are normal.  ENT      Head: Normocephalic and atraumatic.      Nose: No congestion/rhinnorhea.      Mouth/Throat: Mucous membranes are moist.      Neck: No stridor. Hematological/Lymphatic/Immunilogical: No cervical lymphadenopathy. Cardiovascular: Normal rate, regular rhythm.  No murmurs, rubs, or gallops.  Respiratory: Normal respiratory effort without tachypnea nor retractions. Breath sounds are clear and equal bilaterally. No wheezes/rales/rhonchi. Gastrointestinal: Soft and minimally tender diffusely. Genitourinary: Deferred Musculoskeletal: Normal range of motion in all extremities. No lower extremity edema. Neurologic:  Normal speech and language. No gross focal neurologic deficits are appreciated.  Skin:  Skin is warm, dry and intact. No rash noted. Psychiatric: Mood and affect are normal. Speech and behavior are normal. Patient exhibits  appropriate insight and judgment.  ____________________________________________    LABS (pertinent positives/negatives)  Lipase 25 Mg 1.4 CMP na 135, k 3.2, glu 148, cr 0.47 CBC wbc 10.4, hgb 10.2, plt 569 UA clear, large hgb, positive nitrite, >50 RBC, 11-20 wbc, rare bacteria ____________________________________________   EKG  None  ____________________________________________    RADIOLOGY  CT abd/pel IMPRESSION:  Post transgastric cyst gastrostomy drainage of pancreatic fluid  collections with two small residual fluid collections as above.     Changes of necrotizing pancreatitis with significant infiltrative  changes of the peripancreatic soft tissues, including gastric wall,  distal ascending colon, and proximal descending colon as well as  diffuse thickening of tissues within the peritoneum,  retroperitoneum, and mesentery.     Small amount of nonspecific low-attenuation free fluid in pelvis.     Few foci of air within biliary tree question prior ERCP.    ____________________________________________   PROCEDURES  Procedures  ____________________________________________   INITIAL IMPRESSION / ASSESSMENT AND PLAN / ED COURSE  Pertinent labs & imaging results that were available during my care of the patient were reviewed by me and considered in my medical decision making (see chart for details).   Patient presented to the emergency department because of concern for nausea and vomiting. The patient did have recent admission at [facility] for necrotizing pancreatitis and had significant complications. Here patient without any significant abdominal pain or tenderness. Blood work without leukocytosis or lipase elevation. Did obtain a CT scan which does show findings related to necrotizing pancreatitis. Discussed with [facility] with GI. At this time given lack of significant abdominal pain think likely sequelae of recent necrotizing pancreatitis. Patient will be admitted  for hydration and electrolyte repletion. Patient also found to have UA.  ___________________________________________   FINAL CLINICAL IMPRESSION(S) / ED DIAGNOSES  Final diagnoses:  Nausea and vomiting, unspecified vomiting type  Lower urinary tract infectious disease     Note: This dictation was prepared with Dragon dictation. Any transcriptional errors that result from this process are unintentional     Tobi Bastos, MD 11/25/21 2024

## 2021-11-25 NOTE — ED Provider Notes (Signed)
Emergency Medicine Provider Triage Evaluation Note  Sierra Wiley, a 50 y.o. female  was evaluated in triage.  Pt complains of with complaints of nausea and vomiting that have worsened last night.  Patient has been home for a week following a extended admission at [facility] for necrotizing pancreatitis.  She denies any known fevers, but endorses bilious vomitus since last night.  She has been able to keep down some ice chips and Pedialyte in between the nausea and vomiting.  Review of Systems  Positive: Nausea vomiting, abdominal pain Negative: FCS  Physical Exam  There were no vitals taken for this visit. Gen:   Awake, no distress  NAD Resp:  Normal effort CTA MSK:   Moves extremities without difficulty  Other:  ABD: soft, nontender  Medical Decision Making  Medically screening exam initiated at 12:13 PM.  Appropriate orders placed.  Lesta Huberty was informed that the remainder of the evaluation will be completed by another provider, this initial triage assessment does not replace that evaluation, and the importance of remaining in the ED until their evaluation is complete.  Patient with a recent ERCP for necrotizing pancreatitis, presents with 1 day of nausea and vomiting.   Lissa Hoard, PA-C 11/25/21 1254    Delton Prairie, MD 11/25/21 [phone]

## 2021-11-25 NOTE — H&P (Signed)
History and Physical    Sierra Wiley YQM:[id number] DOB: 01-01-1971 DOA: 11/25/2021  PCP: [facility]   Patient coming from: home  I have personally briefly reviewed patient's relevant medical records in [facility] Link  Chief Complaint: Nausea and vomiting  HPI: Sierra Wiley is a 50 y.o. female with medical history significant for Asthma, bipolar 1, HTN, seizures, recent prolonged hospitalization at [facility] from 9/28-11/17 for post ERCP necrotizing pancreatitis, complicated by respiratory failure requiring trach from 10/25-11/14, who is s/p cyst gastrostomy on 10/26 with improvement and s/p necrosectomy on 11/3 and 11/9, discharged on 11/17, who was doing fairly well until several hours prior to admission when she developed nausea and vomiting, with inability to keep down anything except ice chips and Pedialyte.  Vomiting is nonbloody and nonbilious and without coffee grounds.  She denies associated abdominal pain and denies fever or chills.  Denies diarrhea or dysuria.  Has had no cough, shortness of breath or chest pain.    ED course: On arrival mildly tachypneic at 22 with BP 141/88 with otherwise normal vitals Blood work: WBC 10,000.  Lipase 25, LFTs WNL.  Potassium 3.2, magnesium 1.4. COVID and flu negative Urinalysis with positive nitrites and rare bacteria  CT abdomen and pelvis: Compared to CT abdomen from 11/15 showed post transgastric cyst gastrostomy drainage of pancreatic fluid collections with 2 small residual fluid collections ... Sequelae of necrotizing pancreatitis with phlegmon.  No new pancreatic bed fluid collections(please refer to complete report)  The ED provider spoke with gastroenterologist, Dr. Tobi Bastos who advised that patient can remain at this facility with GI following  Patient started on IV fluids, given magnesium and potassium repletion, and also given a dose of Rocephin for possible UTI.  Hospitalist consulted for admission.      Review of  Systems: As per HPI otherwise all other systems on review of systems negative.   Assessment/Plan    Intractable vomiting   Hx of post ERCP necrotizing pancreatitis s/p cyst gastrostomy and necrosectomy 10/2021 at [facility] -Denies abdominal pain and lipase and liver enzymes WNL - CT abdomen showing residual findings from prior necrosectomy and appears nonacute - GI consulted from the ED and will follow - Keep n.p.o. except for ice chips and then advance as tolerated - IV antiemetics  Hypomagnesemia and hypokalemia - IV repletion and monitor  Possible UTI - Received a dose of Rocephin in the ED - Follow culture and continue antibiotics if culture positive  Chronic diarrhea - Patient had negative fecal fat at [facility] with low suspicion for pancreatic insufficiency.  C. difficile was negative.  Creon was discontinued and she was placed on loperamide - Consider continuing loperamide as needed    Asthma - As needed DuoNebs    Bipolar 1 disorder, depressed (HCC) - Continue Abilify, Celexa, Klonopin pending med rec    HTN (hypertension) - Continue amlodipine and prazosin   Seizures (HCC)  Chronic pain, history of - Med rec     Past Medical History:  Diagnosis Date   Asthma    Bipolar 1 disorder (HCC)    Chronic back pain    Depression    Opiate use 02/21/2016   PTSD (post-traumatic stress disorder)    Seizures (HCC)     Past Surgical History:  Procedure Laterality Date   CHOLECYSTECTOMY     ELBOW SURGERY Left 2000   TUBAL LIGATION       reports that she has never smoked. She has never used smokeless tobacco. She reports  that she does not drink alcohol and does not use drugs.  Allergies  Allergen Reactions   Penicillins Nausea And Vomiting    Family History  Problem Relation Age of Onset   Alcohol abuse Mother    Drug abuse Mother    HIV Mother    Bipolar disorder Mother    Cirrhosis Mother    Cancer Father    Heart disease Father    Drug abuse Sister    Alcohol  abuse Sister       Prior to Admission medications   Medication Sig Start Date End Date Taking? Authorizing Provider  albuterol (PROVENTIL HFA;VENTOLIN HFA) 108 (90 Base) MCG/ACT inhaler Inhale 2 puffs into the lungs every 6 (six) hours as needed for wheezing or shortness of breath.    [provider]  amLODipine (NORVASC) 2.5 MG tablet Take 2.5 mg by mouth daily.    [provider]  aripiprazole (ABILIFY) 10 MG disintegrating tablet Take 10 mg by mouth in the morning and at bedtime.    [provider]  budesonide-formoterol (SYMBICORT) 160-4.5 MCG/ACT inhaler Inhale 2 puffs into the lungs 2 (two) times daily. 11/08/20   Salena Saner, MD  cetirizine (ZYRTEC) 10 MG tablet Take 1 tablet (10 mg total) by mouth at bedtime. 11/08/20   Salena Saner, MD  citalopram (CELEXA) 10 MG tablet Take by mouth.    [provider]  clonazePAM (KLONOPIN) 0.5 MG tablet Take 1 tablet by mouth 2 (two) times daily. As needed Patient not taking: Reported on 11/08/2020    [provider]  famotidine (PEPCID) 20 MG tablet Take 1 tablet (20 mg total) by mouth 2 (two) times daily. 08/27/21   Irean Hong, MD  fluticasone (FLONASE) 50 MCG/ACT nasal spray Place 1 spray into both nostrils 2 (two) times daily. 03/02/20   Cuthriell, Delorise Royals, PA-C  gabapentin (NEURONTIN) 300 MG capsule Take 300 mg by mouth 2 (two) times a week. Patient not taking: Reported on 11/08/2020    [provider]  hydrOXYzine (ATARAX/VISTARIL) 10 MG tablet Take 10 mg by mouth every 6 (six) hours as needed.    [provider]  ibuprofen (ADVIL) 400 MG tablet Take 400 mg by mouth 3 (three) times daily as needed. 07/13/20   [provider]  lactulose (CHRONULAC) 10 GM/15ML solution Take 30 mLs (20 g total) by mouth daily as needed for mild constipation. 08/27/21   Irean Hong, MD  lidocaine (LIDODERM) 5 % Place 1 patch onto the skin daily. Remove & Discard patch within 12  hours or as directed by MD    [provider]  liraglutide (VICTOZA) 18 MG/3ML SOPN Inject 0.6 mg into the skin daily.    [provider]  metoCLOPramide (REGLAN) 10 MG tablet Take 1 tablet (10 mg total) by mouth every 8 (eight) hours as needed for nausea. 08/27/21   Irean Hong, MD  omeprazole (PRILOSEC) 10 MG capsule Take 1 capsule (10 mg total) by mouth daily. Patient not taking: Reported on 11/08/2020 03/02/20   Cuthriell, Delorise Royals, PA-C  oxybutynin (DITROPAN-XL) 10 MG 24 hr tablet Take 10 mg by mouth at bedtime. Patient not taking: Reported on 11/08/2020    [provider]  prazosin (MINIPRESS) 2 MG capsule Take 2 mg by mouth at bedtime.    [provider]  propranolol (INDERAL) 20 MG tablet Take 10 mg by mouth 2 (two) times daily. Patient not taking: Reported on 11/08/2020    [provider]  sucralfate (CARAFATE) 1 GM/10ML suspension Take 10 mLs (1 g total) by mouth 4 (four) times daily. 08/27/21   Irean Hong, MD  sulfamethoxazole-trimethoprim (BACTRIM DS) 800-160 MG tablet Take 1 tablet by mouth 2 (two) times daily. 07/11/21   Cuthriell, Delorise Royals, PA-C  Tiotropium Bromide Monohydrate (SPIRIVA RESPIMAT) 1.25 MCG/ACT AERS Inhale 2 puffs into the lungs daily for 1 day. 11/08/20 11/09/20  Salena Saner, MD  Tiotropium Bromide Monohydrate (SPIRIVA RESPIMAT) 1.25 MCG/ACT AERS Inhale 2 puffs into the lungs daily. 11/28/20   Salena Saner, MD  tiZANidine (ZANAFLEX) 4 MG capsule Take by mouth.    [provider]  traZODone (DESYREL) 100 MG tablet Take 150-300 mg by mouth at bedtime. Patient not taking: Reported on 11/08/2020    [provider]  vitamin B-12 (CYANOCOBALAMIN) 1000 MCG tablet Take 1,000 mcg by mouth daily.    [provider]    Physical Exam: Vitals:   11/25/21 1218 11/25/21 1600 11/25/21 1730 11/25/21 1934  BP:  139/84 (!) 141/88 135/89  Pulse:  92 95 92  Resp:  (!) Temp:       TempSrc:      SpO2:  98% 98% 99%  Weight: 87.5 kg     Height:  (1.626 m)      Constitutional: Alert, oriented x 3 . Not in any apparent distress HEENT:      Head: Normocephalic and atraumatic.         Eyes: PERLA, EOMI, Conjunctivae are normal. Sclera is non-icteric.       Mouth/Throat: Mucous membranes are moist.       Neck: Supple with no signs of meningismus. Cardiovascular: Regular rate and rhythm. No murmurs, gallops, or rubs. 2+ symmetrical distal pulses are present . No JVD. No  LE edema Respiratory: Respiratory effort normal .Lungs sounds clear bilaterally. No wheezes, crackles, or rhonchi.  Gastrointestinal: Soft, non tender, non distended. Positive bowel sounds.  Genitourinary: No CVA tenderness. Musculoskeletal: Nontender with normal range of motion in all extremities. No cyanosis, or erythema of extremities. Neurologic:  Face is symmetric. Moving all extremities. No gross focal neurologic deficits . Skin: Skin is warm, dry.  No rash or ulcers Psychiatric: Mood and affect are appropriate    Labs on Admission: I have personally reviewed following labs and imaging studies  CBC: Recent Labs  Lab 11/25/21 1223  WBC 10.4  NEUTROABS 6.8  HGB 10.2*  HCT 33.0*  MCV 86.4  PLT 569*   Basic Metabolic Panel: Recent Labs  Lab 11/25/21 1223  NA 135  K 3.2*  CL 96*  CO2 30  GLUCOSE 148*  BUN 6  CREATININE 0.47  CALCIUM 9.5  MG 1.4*   GFR: Estimated Creatinine Clearance: 92 mL/min (by C-G formula based on SCr of 0.47 mg/dL). Liver Function Tests: Recent Labs  Lab 11/25/21 1223  AST 21  ALT 27  ALKPHOS 67  BILITOT 0.8  PROT 8.5*  ALBUMIN 3.1*   Recent Labs  Lab 11/25/21 1223  LIPASE 25   No results for input(s): AMMONIA in the last 168 hours. Coagulation Profile: No results for input(s): INR, PROTIME in the last 168 hours. Cardiac Enzymes: No results for input(s): CKTOTAL, CKMB, CKMBINDEX, TROPONINI in the last 168 hours. BNP (last 3  results) No results for input(s): PROBNP in the last 8760 hours. HbA1C: No results for input(s): HGBA1C in the last 72 hours. CBG: No results for input(s): GLUCAP in the last 168 hours.  Lipid Profile: No results for input(s): CHOL, HDL, LDLCALC, TRIG, CHOLHDL, LDLDIRECT in the last 72 hours. Thyroid Function Tests: No results for input(s): TSH, T4TOTAL, FREET4, T3FREE, THYROIDAB in the last 72 hours. Anemia Panel: No results for input(s): VITAMINB12, FOLATE, FERRITIN, TIBC, IRON, RETICCTPCT in the last 72 hours. Urine analysis:    Component Value Date/Time   COLORURINE YELLOW 11/25/2021 1544   APPEARANCEUR CLEAR 11/25/2021 1544   APPEARANCEUR Clear 03/24/2014 2008   LABSPEC <1.005 (L) 11/25/2021 1544   LABSPEC 1.004 03/24/2014 2008   PHURINE 7.0 11/25/2021 1544   GLUCOSEU NEGATIVE 11/25/2021 1544   GLUCOSEU Negative 03/24/2014 2008   HGBUR LARGE (A) 11/25/2021 1544   BILIRUBINUR NEGATIVE 11/25/2021 1544   BILIRUBINUR Negative 03/24/2014 2008   KETONESUR 40 (A) 11/25/2021 1544   PROTEINUR 100 (A) 11/25/2021 1544   NITRITE POSITIVE (A) 11/25/2021 1544   LEUKOCYTESUR NEGATIVE 11/25/2021 1544   LEUKOCYTESUR Trace 03/24/2014 2008    Radiological Exams on Admission: CT ABDOMEN PELVIS W CONTRAST  Result Date: 11/25/2021 CLINICAL DATA:  Abdominal pain, nausea, vomiting, recent necrotizing pancreatitis EXAM: CT ABDOMEN AND PELVIS WITH CONTRAST TECHNIQUE: Multidetector CT imaging of the abdomen and pelvis was performed using the standard protocol following bolus administration of intravenous contrast. CONTRAST:  OMNIPAQUE IOHEXOL 300 MG/ML SOLN IV. No oral contrast. COMPARISON:  08/27/2021 Correlation: Report from an outside CT, [facility], 11/13/2021 FINDINGS: Lower chest: Lung bases clear Hepatobiliary: Gallbladder surgically absent. Few foci of air within biliary tree question prior ERCP. No biliary dilatation. Liver otherwise unremarkable. Pancreas: Post transgastric cyst gastrostomy  drainage of pancreatic fluid collections, with 2 stents identified. Small residual fluid collection at the pancreatic tail 2.6 x 1.6 cm image 27. Extensive infiltrative changes of the peripancreatic soft tissues with ill-defined pancreatic bed. Foci of gas are seen at the head and tail portions of the pancreatic bed likely related to prior necrosis and drainage. Small residual fluid collection posterior to the pancreas 19 x 11 mm image 34, likely adjacent to the anterior margin of the IVC. Remainder of pancreatic bed demonstrates sequela of necrotizing pancreatitis with phlegmon. No new pancreatic bed fluid collections. Spleen: Normal appearance Adrenals/Urinary Tract: Adrenal glands, kidneys, ureters, and bladder normal appearance Stomach/Bowel: Thickening of gastric wall likely related to pancreatitis and surgery. Additional thickening is seen of the distal ascending through proximal descending colon likely due to pancreatitis and associated inflammatory changes. Appendix not visualized. Thickening of this regional small bowel loops in the upper abdomen similar to colon. Vascular/Lymphatic: Vascular structures patent. SMV appears diminutive at the level of the pancreas but grossly patent. No adenopathy. Reproductive: Unremarkable adnexa.  IUD within uterus. Other: Small amount of nonspecific low-attenuation free fluid in pelvis. Diffuse infiltrative changes of anterior and posterior pararenal space fat likely inflammatory related to preceding pancreatitis. Musculoskeletal: No acute osseous findings. IMPRESSION: Post transgastric cyst gastrostomy drainage of pancreatic fluid collections with two small residual fluid collections as above. Changes of necrotizing pancreatitis with significant infiltrative changes of the peripancreatic soft tissues, including gastric wall, distal ascending colon, and proximal descending colon as well as diffuse thickening of tissues within the peritoneum, retroperitoneum, and  mesentery. Small amount of nonspecific low-attenuation free fluid in pelvis. Few foci of air within biliary tree question prior ERCP. Electronically Signed   By: Ulyses Southward M.D.   On: 11/25/2021 18:43     DVT prophylaxis: Lovenox  Code Status: full code  Family Communication:  none  Disposition Plan: Back to previous home environment Consults called: none  Status:At the time of admission, it appears that the appropriate admission status for this patient is INPATIENT. This is judged to be reasonable and necessary in order to provide the required intensity of service to ensure the patient's safety given the presenting symptoms, physical exam findings, and initial radiographic and laboratory data in the context of their  Comorbid conditions.   Patient requires inpatient status due to high intensity of service, high risk for further deterioration and high frequency of surveillance required.   I certify that at the point of admission it is my clinical judgment that the patient will require inpatient hospital care spanning beyond 2 midnights    Andris Baumann MD [facility]   11/25/2021, 8:56 PM

## 2021-11-25 NOTE — ED Notes (Signed)
This RN assisted pt to toilet to urinate.  Pt tolerated well.

## 2021-11-25 NOTE — ED Notes (Signed)
This RN called mother Baby unit to give report.  Per Tyler Aas, waiting on a call back to determine if appropriate assignment.

## 2021-11-25 NOTE — ED Triage Notes (Signed)
Pt via EMS from home. Pt c/o abd pain and vomiting since last night. Pt was recently seen and admitted to [facility] for severe pancreatitis. Pt has been home since last Thursday. Pt states that she is suppose to have another procedure on Wednesday. Pt is A&Ox4 and NAD.

## 2021-11-25 NOTE — ED Notes (Signed)
Pt c/o heartburn, requested TUMS.  Provider notified.

## 2021-11-25 NOTE — Progress Notes (Signed)
PHARMACIST - PHYSICIAN COMMUNICATION  CONCERNING:  Enoxaparin (Lovenox) for DVT Prophylaxis    RECOMMENDATION: Patient was prescribed enoxaprin 40mg  q24 hours for VTE prophylaxis.   Filed Weights   11/25/21 1218  Weight: 87.5 kg (193 lb)    Body mass index is 33.13 kg/m.  Estimated Creatinine Clearance: 92 mL/min (by C-G formula based on SCr of 0.47 mg/dL).   Based on [facility] policy patient is candidate for enoxaparin 0.5mg /kg TBW SQ every 24 hours based on BMI being >30.   DESCRIPTION: Pharmacy has adjusted enoxaparin dose per [facility] policy.  Patient is now receiving enoxaparin 45 mg every 24 hours    [facility], PharmD Clinical Pharmacist  11/25/2021 9:32 PM

## 2021-11-26 DIAGNOSIS — R1110 Vomiting, unspecified: Secondary | ICD-10-CM | POA: Diagnosis not present

## 2021-11-26 DIAGNOSIS — R112 Nausea with vomiting, unspecified: Secondary | ICD-10-CM | POA: Diagnosis not present

## 2021-11-26 LAB — COMPREHENSIVE METABOLIC PANEL
ALT: 21 U/L (ref 0–44)
AST: 18 U/L (ref 15–41)
Albumin: 2.6 g/dL — ABNORMAL LOW (ref 3.5–5.0)
Alkaline Phosphatase: 68 U/L (ref 38–126)
Anion gap: 7 (ref 5–15)
BUN: <5 mg/dL — ABNORMAL LOW (ref 6–20)
CO2: 26 mmol/L (ref 22–32)
Calcium: 8.4 mg/dL — ABNORMAL LOW (ref 8.9–10.3)
Chloride: 102 mmol/L (ref 98–111)
Creatinine, Ser: 0.48 mg/dL (ref 0.44–1.00)
GFR, Estimated: >60 mL/min (ref >60)
Glucose, Bld: 182 mg/dL — ABNORMAL HIGH (ref 70–99)
Potassium: 3.4 mmol/L — ABNORMAL LOW (ref 3.5–5.1)
Sodium: 135 mmol/L (ref 135–145)
Total Bilirubin: 0.5 mg/dL (ref 0.3–1.2)
Total Protein: 7.2 g/dL (ref 6.5–8.1)

## 2021-11-26 LAB — MAGNESIUM: Magnesium: 1.7 mg/dL (ref 1.7–2.4)

## 2021-11-26 LAB — CBC
HCT: 30.8 % — ABNORMAL LOW (ref 36.0–46.0)
Hemoglobin: 9.5 g/dL — ABNORMAL LOW (ref 12.0–15.0)
MCH: 26.2 pg (ref 26.0–34.0)
MCHC: 30.8 g/dL (ref 30.0–36.0)
MCV: 84.8 fL (ref 80.0–100.0)
Platelets: 517 10*3/uL — ABNORMAL HIGH (ref 150–400)
RBC: 3.63 MIL/uL — ABNORMAL LOW (ref 3.87–5.11)
RDW: 15.3 % (ref 11.5–15.5)
WBC: 10.9 10*3/uL — ABNORMAL HIGH (ref 4.0–10.5)
nRBC: 0 % (ref 0.0–0.2)

## 2021-11-26 LAB — FOLATE: Folate: 11.1 ng/mL (ref >5.9)

## 2021-11-26 LAB — CREATININE, SERUM
Creatinine, Ser: 0.45 mg/dL (ref 0.44–1.00)
GFR, Estimated: >60 mL/min (ref >60)

## 2021-11-26 LAB — IRON AND TIBC
Iron: 9 ug/dL — ABNORMAL LOW (ref 28–170)
Saturation Ratios: 4 % — ABNORMAL LOW (ref 10.4–31.8)
TIBC: 211 ug/dL — ABNORMAL LOW (ref 250–450)
UIBC: 202 ug/dL

## 2021-11-26 LAB — VITAMIN B12: Vitamin B-12: 335 pg/mL (ref 180–914)

## 2021-11-26 LAB — FERRITIN: Ferritin: 113 ng/mL (ref 11–307)

## 2021-11-26 LAB — HIV ANTIBODY (ROUTINE TESTING W REFLEX): HIV Screen 4th Generation wRfx: NONREACTIVE

## 2021-11-26 MED ORDER — ALUM & MAG HYDROXIDE-SIMETH 200-200-20 MG/5ML PO SUSP
30.0000 mL | ORAL | Status: DC | PRN
  Administered 2021-11-26: 13:00:00 30 mL via ORAL
  Filled 2021-11-26: qty 30

## 2021-11-26 MED ORDER — SODIUM CHLORIDE 0.9 % IV SOLN
250.0000 mg | Freq: Every day | INTRAVENOUS | Status: DC
  Administered 2021-11-26 – 2021-11-27 (×2): 250 mg via INTRAVENOUS
  Filled 2021-11-26 (×3): qty 20

## 2021-11-26 MED ORDER — ARIPIPRAZOLE 10 MG PO TABS
10.0000 mg | ORAL_TABLET | Freq: Every day | ORAL | Status: DC
  Administered 2021-11-26 – 2021-11-27 (×2): 10 mg via ORAL
  Filled 2021-11-26 (×3): qty 1

## 2021-11-26 MED ORDER — PROMETHAZINE HCL 25 MG PO TABS
25.0000 mg | ORAL_TABLET | Freq: Four times a day (QID) | ORAL | Status: DC | PRN
  Filled 2021-11-26: qty 1

## 2021-11-26 MED ORDER — PANCRELIPASE (LIP-PROT-AMYL) 36000-114000 UNITS PO CPEP
36000.0000 [IU] | ORAL_CAPSULE | Freq: Three times a day (TID) | ORAL | Status: DC
  Administered 2021-11-26 – 2021-11-27 (×3): 36000 [IU] via ORAL
  Filled 2021-11-26 (×6): qty 1

## 2021-11-26 MED ORDER — BOOST / RESOURCE BREEZE PO LIQD CUSTOM
1.0000 | Freq: Three times a day (TID) | ORAL | Status: DC
  Administered 2021-11-27: 235 mL via ORAL

## 2021-11-26 MED ORDER — SODIUM CHLORIDE 0.9 % IV SOLN
12.5000 mg | Freq: Four times a day (QID) | INTRAVENOUS | Status: DC | PRN
  Administered 2021-11-26: 07:00:00 12.5 mg via INTRAVENOUS
  Filled 2021-11-26: qty 0.5

## 2021-11-26 MED ORDER — INFLUENZA VAC SPLIT QUAD 0.5 ML IM SUSY: 0.5000 mL | PREFILLED_SYRINGE | INTRAMUSCULAR | Status: DC
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
## 2021-11-26 NOTE — Plan of Care (Signed)
Transferred to Room 348 from E.R. Pt. Is alert and oriented with quiet affect. Color good, skin w&d. BBS clear. Denies Nausea at this time. Instructed in Fall Risk and to call RN when OOB and Pt. V/o. I assisted her up to Bathroom and she had an independent,steady and strong gait. She denies syncope. Pt. States she was D/C'd from [facility] appx. One week ago after 50 Day stay for complications from Pancreatitis. Pt. States she was sent home with a Dan Humphreys but she does not use it. Pt. also stated she was supposed to have a home visit from Physical Therapy next week since she had been in the Hospital so so long. Pt. Has a scabbed over area mid neck from where she had a Tracheostomy during her hospitalization. Pt. States this was removed 11/09/21. Pt. Denies any difficulty breathing and her RR and effort are wnl. BBS clear and voice is clear. Her abdomen is soft with Positive Bowel Sounds and Pt. States last B.M. 11/25/2021. SHe denies pain. Pt. Oriented to room, Safety and Security and Fall Prevention. Pt. V/O.

## 2021-11-26 NOTE — Consult Note (Signed)
Sierra Darby, MD [address]  [place], [place] [address]  Main: [phone]  Fax: [phone] Pager: [phone]   Consultation  Referring Provider:     No ref. provider found Primary Care Physician:  Inc, Bessie Primary Gastroenterologist: [facility] advanced endoscopy team         Reason for Consultation:     Intractable nausea and vomiting  Date of Admission:  11/25/2021 Date of Consultation:  11/26/2021         HPI:   Sierra Wiley is a 50 y.o. female with history of choledocholithiasis s/p ERCP as outpatient on 09/26/2021 with biliary sphincterotomy and stone extraction, post ERCP necrotizing pancreatitis, prolonged hospitalization at [facility] requiring ICU stay, tracheostomy. Patient underwent cyst gastrostomy at [facility] on 10/24/2021, repeat upper endoscopy with necrosectomy on 11/01/2021 and 11/07/2021 who was discharged on 11/17.  Patient presented to ER yesterday with nausea, vomiting and poor p.o. intake.  Patient is admitted for further management.  Serum lipase was normal.  CT revealed Compared to CT abdomen from 11/15 showed post transgastric cyst gastrostomy drainage of pancreatic fluid collections with 2 small residual fluid collections. Sequelae of necrotizing pancreatitis with phlegmon.  No new pancreatic bed fluid collections.  Patient is afebrile, hemodynamically stable, mild leukocytosis, received Rocephin in the ER with thrombocytosis, mild hypokalemia, normal LFTs  Patient is kept n.p.o., on PPI and antiemetics When I interviewed the patient, she denied any abdominal pain, nausea and vomiting.  She was started on clear liquids and she tolerated well.  Patient's daughter is bedside.  Patient states she wants to go home.  She has been having 4-5 watery bowel movements daily since she had an attack of pancreatitis  Patient denies alcohol use, she does not smoke tobacco  NSAIDs: None  Antiplts/Anticoagulants/Anti thrombotics: None  GI  Procedures:  ERCP at [facility] 09/26/2021 for CBD stones Impression:            - The major papilla appeared normal.                       - Choledocholithiasis was found. Complete removal was                        accomplished by biliary sphincterotomy, balloon                        dilation of the biliary orifice to 55m, and balloon                        extraction.                       - One temporary plastic pancreatic stent was placed                        into the ventral pancreatic duct, which migrated out                        by the end of the procedure.                       - No specimens collected.  [facility]            - Extrinsic compression in the posterior wall of the  stomach.                         - A Walled-off pancreatic necrotic collection was                         seen..                         - Cystogastrostomy was performed.                         - No specimens collected.  Upper endoscopy 11/01/2021 at [facility]      - A nasogastric tube were found in the esophagus. Wire                         inserted to maintain position, with position the same                         at the end of the procedure.                         - Pre-existing cystgastrostomy. Plastic stent removed                         and replaced by the end of the procedure.                         - Necrosectomy was performed with a large amount of                         removed material.                         - No specimens collected.  Upper endoscopy 11/07/2021 at [facility] Impression:            - A nasogastric tube was found. Wire inserted which                         maintained its position at the end of the procedure.                         - Pre-existing cystgastrostomy stent (AXIOS only,                         double pigtail migrated to third portion of the                         duodenum, which was removed). At the end of the                          procedure a 7Fr x 6cm and a 7Fr x 7cm double pigtail                         stents were inserted into each lobe of the bilobar                         collection across the AXIOS.                         -  Necrosectomy was performed with small amount of                         remaining debris but with thick liquid obscuring                         complete evaluation.   Past Medical History:  Diagnosis Date   Asthma    Bipolar 1 disorder ([place])    Chronic back pain    Depression    Opiate use 02/21/2016   PTSD (post-traumatic stress disorder)    Seizures (HCC)     Past Surgical History:  Procedure Laterality Date   CHOLECYSTECTOMY     ELBOW SURGERY Left 2000   TUBAL LIGATION      Prior to Admission medications   Medication Sig Start Date End Date Taking? Authorizing Provider  albuterol (PROVENTIL HFA;VENTOLIN HFA) 108 (90 Base) MCG/ACT inhaler Inhale 2 puffs into the lungs every 6 (six) hours as needed for wheezing or shortness of breath.    [provider]  amLODipine (NORVASC) 2.5 MG tablet Take 2.5 mg by mouth daily.    [provider]  aripiprazole (ABILIFY) 10 MG disintegrating tablet Take 10 mg by mouth in the morning and at bedtime.    [provider]  budesonide-formoterol (SYMBICORT) 160-4.5 MCG/ACT inhaler Inhale 2 puffs into the lungs 2 (two) times daily. 11/08/20   Tyler Pita, MD  cetirizine (ZYRTEC) 10 MG tablet Take 1 tablet (10 mg total) by mouth at bedtime. 11/08/20   Tyler Pita, MD  citalopram (CELEXA) 10 MG tablet Take by mouth.    [provider]  clonazePAM (KLONOPIN) 0.5 MG tablet Take 1 tablet by mouth 2 (two) times daily. As needed Patient not taking: Reported on 11/08/2020    [provider]  famotidine (PEPCID) 20 MG tablet Take 1 tablet (20 mg total) by mouth 2 (two) times daily. 08/27/21   Paulette Blanch, MD  fluticasone (FLONASE) 50 MCG/ACT nasal spray Place 1 spray into both nostrils  2 (two) times daily. 03/02/20   Cuthriell, Charline Bills, PA-C  gabapentin (NEURONTIN) 300 MG capsule Take 300 mg by mouth 2 (two) times a week. Patient not taking: Reported on 11/08/2020    [provider]  hydrOXYzine (ATARAX/VISTARIL) 10 MG tablet Take 10 mg by mouth every 6 (six) hours as needed.    [provider]  ibuprofen (ADVIL) 400 MG tablet Take 400 mg by mouth 3 (three) times daily as needed. 07/13/20   [provider]  lactulose (CHRONULAC) 10 GM/15ML solution Take 30 mLs (20 g total) by mouth daily as needed for mild constipation. 08/27/21   Paulette Blanch, MD  lidocaine (LIDODERM) 5 % Place 1 patch onto the skin daily. Remove & Discard patch within 12 hours or as directed by MD    [provider]  liraglutide (VICTOZA) 18 MG/3ML SOPN Inject 0.6 mg into the skin daily.    [provider]  metoCLOPramide (REGLAN) 10 MG tablet Take 1 tablet (10 mg total) by mouth every 8 (eight) hours as needed for nausea. 08/27/21   Paulette Blanch, MD  omeprazole (PRILOSEC) 10 MG capsule Take 1 capsule (10 mg total) by mouth daily. Patient not taking: Reported on 11/08/2020 03/02/20   Cuthriell, Charline Bills, PA-C  oxybutynin (DITROPAN-XL) 10 MG 24 hr tablet Take 10 mg by mouth at bedtime. Patient not taking: Reported on 11/08/2020  [provider]  prazosin (MINIPRESS) 2 MG capsule Take 2 mg by mouth at bedtime.    [provider]  propranolol (INDERAL) 20 MG tablet Take 10 mg by mouth 2 (two) times daily. Patient not taking: Reported on 11/08/2020    [provider]  sucralfate (CARAFATE) 1 GM/10ML suspension Take 10 mLs (1 g total) by mouth 4 (four) times daily. 08/27/21   Paulette Blanch, MD  sulfamethoxazole-trimethoprim (BACTRIM DS) 800-160 MG tablet Take 1 tablet by mouth 2 (two) times daily. 07/11/21   Cuthriell, Charline Bills, PA-C  Tiotropium Bromide Monohydrate (SPIRIVA RESPIMAT) 1.25 MCG/ACT AERS Inhale 2 puffs into the lungs daily for 1  day. 11/08/20 11/09/20  Tyler Pita, MD  Tiotropium Bromide Monohydrate (SPIRIVA RESPIMAT) 1.25 MCG/ACT AERS Inhale 2 puffs into the lungs daily. 11/28/20   Tyler Pita, MD  tiZANidine (ZANAFLEX) 4 MG capsule Take by mouth.    [provider]  traZODone (DESYREL) 100 MG tablet Take 150-300 mg by mouth at bedtime. Patient not taking: Reported on 11/08/2020    [provider]  vitamin B-12 (CYANOCOBALAMIN) 1000 MCG tablet Take 1,000 mcg by mouth daily.    [provider]    Current Facility-Administered Medications:    acetaminophen (TYLENOL) tablet 650 mg, 650 mg, Oral, Q6H PRN **OR** acetaminophen (TYLENOL) suppository 650 mg, 650 mg, Rectal, Q6H PRN, Athena Masse, MD   albuterol (PROVENTIL) (2.5 MG/3ML) 0.083% nebulizer solution 2.5 mg, 2.5 mg, Nebulization, Q6H PRN, Rauer, Samantha O, RPH   alum & mag hydroxide-simeth (MAALOX/MYLANTA) 200-200-20 MG/5ML suspension 30 mL, 30 mL, Oral, Q4H PRN, Enzo Bi, MD, 30 mL at 11/26/21 1248   amLODipine (NORVASC) tablet 2.5 mg, 2.5 mg, Oral, Daily, Judd Gaudier V, MD, 2.5 mg at 11/26/21 1012   ARIPiprazole (ABILIFY) tablet 10 mg, 10 mg, Oral, Daily, Judd Gaudier V, MD, 10 mg at 11/26/21 0933   enoxaparin (LOVENOX) injection 45 mg, 0.5 mg/kg, Subcutaneous, Q24H, Rauer, Forde Dandy, RPH, 45 mg at 11/25/21 2208   feeding supplement (BOOST / RESOURCE BREEZE) liquid 1 Container, 1 Container, Oral, TID BM, Samariya Rockhold, Tally Due, MD   HYDROcodone-acetaminophen (NORCO/VICODIN) 5-325 MG per tablet 1-2 tablet, 1-2 tablet, Oral, Q4H PRN, Athena Masse, MD   [START ON 11/27/2021] influenza vac split quadrivalent PF (FLUARIX) injection 0.5 mL, 0.5 mL, Intramuscular, Tomorrow-1000, Duncan, Hazel V, MD   lipase/protease/amylase (CREON) capsule 36,000 Units, 36,000 Units, Oral, TID WC, Jose Alleyne, Tally Due, MD   morphine 2 MG/ML injection 2 mg, 2 mg, Intravenous, Q2H PRN, Athena Masse, MD   ondansetron [facility]) tablet 4 mg, 4  mg, Oral, Q6H PRN, 4 mg at 11/26/21 1414 **OR** ondansetron (ZOFRAN) injection 4 mg, 4 mg, Intravenous, Q6H PRN, Athena Masse, MD, 4 mg at 11/26/21 0325   pantoprazole (PROTONIX) injection 40 mg, 40 mg, Intravenous, Q24H, Judd Gaudier V, MD, 40 mg at 11/25/21 2216   promethazine (PHENERGAN) tablet 25 mg, 25 mg, Oral, Q6H PRN, Enzo Bi, MD  Family History  Problem Relation Age of Onset   Alcohol abuse Mother    Drug abuse Mother    HIV Mother    Bipolar disorder Mother    Cirrhosis Mother    Cancer Father    Heart disease Father    Drug abuse Sister    Alcohol abuse Sister      Social History   Tobacco Use   Smoking status: Never   Smokeless tobacco: Never  Vaping Use   Vaping Use: Never  used  Substance Use Topics   Alcohol use: No    Alcohol/week: 0.0 standard drinks   Drug use: No    Allergies as of 11/25/2021 - Review Complete 11/25/2021  Allergen Reaction Noted   Penicillins Nausea And Vomiting 07/01/2015    Review of Systems:    All systems reviewed and negative except where noted in HPI.   Physical Exam:  Vital signs in last 24 hours: Temp:  [98 F (36.7 C)-98.9 F (37.2 C)] 98.9 F (37.2 C) (11/28 1219) Pulse Rate:  [86-99] 99 (11/28 1219) Resp:  [15-22] 20 (11/28 0826) BP: (124-144)/(78-91) 124/78 (11/28 1219) SpO2:  [98 %-100 %] 99 % (11/28 1219) Last BM Date: 11/25/21 General:   Pleasant, cooperative in NAD Head:  Normocephalic and atraumatic. Eyes:   No icterus.   Conjunctiva pink. PERRLA. Ears:  Normal auditory acuity. Neck:  Supple; no masses or thyroidomegaly Lungs: Respirations even and unlabored. Lungs clear to auscultation bilaterally.   No wheezes, crackles, or rhonchi.  Heart:  Regular rate and rhythm;  Without murmur, clicks, rubs or gallops Abdomen:  Soft, nondistended, nontender. Normal bowel sounds. No appreciable masses or hepatomegaly.  No rebound or guarding.  Rectal:  Not performed. Msk:  Symmetrical without gross deformities.   Strength normal Extremities:  Without edema, cyanosis or clubbing. Neurologic:  Alert and oriented x3;  grossly normal neurologically. Skin:  Intact without significant lesions or rashes. Cervical Nodes:  No significant cervical adenopathy. Psych:  Alert and cooperative. Normal affect.  LAB RESULTS: CBC Latest Ref Rng & Units 11/26/2021 11/25/2021 11/25/2021  WBC 4.0 - 10.5 K/uL 10.9(H) 9.0 10.4  Hemoglobin 12.0 - 15.0 g/dL 9.5(L) 9.1(L) 10.2(L)  Hematocrit 36.0 - 46.0 % 30.8(L) 29.5(L) 33.0(L)  Platelets 150 - 400 K/uL 517(H) 498(H) 569(H)    BMET BMP Latest Ref Rng & Units 11/26/2021 11/25/2021 11/25/2021  Glucose 70 - 99 mg/dL 182(H) - 148(H)  BUN 6 - 20 mg/dL <5(L) - 6  Creatinine 0.44 - 1.00 mg/dL 0.48 0.45 0.47  Sodium 135 - 145 mmol/L 135 - 135  Potassium 3.5 - 5.1 mmol/L 3.4(L) - 3.2(L)  Chloride 98 - 111 mmol/L 102 - 96(L)  CO2 22 - 32 mmol/L 26 - 30  Calcium 8.9 - 10.3 mg/dL 8.4(L) - 9.5    LFT Hepatic Function Latest Ref Rng & Units 11/26/2021 11/25/2021 09/23/2021  Total Protein 6.5 - 8.1 g/dL 7.2 8.5(H) 7.8  Albumin 3.5 - 5.0 g/dL 2.6(L) 3.1(L) 4.0  AST 15 - 41 U/L 18 21 102(H)  ALT 0 - 44 U/L 21 27 179(H)  Alk Phosphatase 38 - 126 U/L 68 67 317(H)  Total Bilirubin 0.3 - 1.2 mg/dL 0.5 0.8 0.8     STUDIES: CT ABDOMEN PELVIS W CONTRAST  Result Date: 11/25/2021 CLINICAL DATA:  Abdominal pain, nausea, vomiting, recent necrotizing pancreatitis EXAM: CT ABDOMEN AND PELVIS WITH CONTRAST TECHNIQUE: Multidetector CT imaging of the abdomen and pelvis was performed using the standard protocol following bolus administration of intravenous contrast. CONTRAST:  160m OMNIPAQUE IOHEXOL 300 MG/ML SOLN IV. No oral contrast. COMPARISON:  08/27/2021 Correlation: [facility] FINDINGS: Lower chest: Lung bases clear Hepatobiliary: Gallbladder surgically absent. Few foci of air within biliary tree question prior ERCP. No biliary dilatation. Liver  otherwise unremarkable. Pancreas: Post transgastric cyst gastrostomy drainage of pancreatic fluid collections, with 2 stents identified. Small residual fluid collection at the pancreatic tail 2.6 x 1.6 cm image 27. Extensive infiltrative changes of the peripancreatic soft tissues with  ill-defined pancreatic bed. Foci of gas are seen at the head and tail portions of the pancreatic bed likely related to prior necrosis and drainage. Small residual fluid collection posterior to the pancreas 19 x 11 mm image 34, likely adjacent to the anterior margin of the IVC. Remainder of pancreatic bed demonstrates sequela of necrotizing pancreatitis with phlegmon. No new pancreatic bed fluid collections. Spleen: Normal appearance Adrenals/Urinary Tract: Adrenal glands, kidneys, ureters, and bladder normal appearance Stomach/Bowel: Thickening of gastric wall likely related to pancreatitis and surgery. Additional thickening is seen of the distal ascending through proximal descending colon likely due to pancreatitis and associated inflammatory changes. Appendix not visualized. Thickening of this regional small bowel loops in the upper abdomen similar to colon. Vascular/Lymphatic: Vascular structures patent. SMV appears diminutive at the level of the pancreas but grossly patent. No adenopathy. Reproductive: Unremarkable adnexa.  IUD within uterus. Other: Small amount of nonspecific low-attenuation free fluid in pelvis. Diffuse infiltrative changes of anterior and posterior pararenal space fat likely inflammatory related to preceding pancreatitis. Musculoskeletal: No acute osseous findings. IMPRESSION: Post transgastric cyst gastrostomy drainage of pancreatic fluid collections with two small residual fluid collections as above. Changes of necrotizing pancreatitis with significant infiltrative changes of the peripancreatic soft tissues, including gastric wall, distal ascending colon, and proximal descending colon as well as diffuse  thickening of tissues within the peritoneum, retroperitoneum, and mesentery. Small amount of nonspecific low-attenuation free fluid in pelvis. Few foci of air within biliary tree question prior ERCP. Electronically Signed   By: Lavonia Dana M.D.   On: 11/25/2021 18:43      Impression / Plan:   Dareen Gutzwiller is a 50 y.o. pleasant African-American female with history of post ERCP acute necrotizing pancreatitis s/p cyst gastrostomy in 09/2021 is admitted with 1 day history of intractable nausea and vomiting.  There is no evidence of recurrence of acute pancreatitis.  Patient is tolerating clear liquids today.  Advance to full liquids.  She is no longer experiencing nausea and vomiting.  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  GI will sign off at this time, please call us back with questions or concerns    LOS: 1 day   Sherri Sear, MD  11/26/2021, 3:29 PM    Note: This dictation was prepared with Dragon dictation along with smaller phrase technology. Any transcriptional errors that result from this process are unintentional.

## 2021-11-26 NOTE — Progress Notes (Addendum)
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 female with medical history significant for Asthma, bipolar 1, HTN, seizures, recent prolonged hospitalization at [facility] from 9/28-11/17 for post ERCP necrotizing pancreatitis, complicated by respiratory failure requiring trach from 10/25-11/14, who is s/p cyst gastrostomy on 10/26 with improvement and s/p necrosectomy on 11/3 and 11/9, discharged on 11/17, who was doing fairly well until several hours prior to admission when she developed nausea and vomiting, with inability to keep down anything except ice chips and Pedialyte.      Intractable vomiting -Denies abdominal pain and lipase and liver enzymes WNL - CT abdomen showing residual findings from prior necrosectomy and appears nonacute Plan: --GI consult today --advance diet to liquid --anti-emetics --d/c MIVF --Monitor overnight, and if tolerating full liquid diet and stable labs, will discharge tomorrow.  Hypomagnesemia and hypokalemia --monitor and replete PRN   UTI, ruled out - Received a dose of Rocephin in the ED --Pt denied dysuria.   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 --IV iron   DVT prophylaxis: Lovenox SQ Code Status: Full code   Family Communication:  Level of care: Med-Surg Dispo:   The patient is from: home Anticipated d/c is to: home Anticipated d/c date is: likely tomorrow Patient currently is not medically ready to d/c due to: need to see if pt tolerating at least full liquid diet.   Subjective and Interval History:  RN reported pt had an episode of large amount vomit earlier this morning.  Pt denied abdominal pain, but complained of feeling very gassy.     Objective: Vitals:   11/26/21 0247 11/26/21 0826 11/26/21 1219 11/26/21 1619  BP: (!) 140/91 (!) 144/80 124/78 120/72  Pulse: 97 98 99 91  Resp: 18 20  18   Temp: 98.9 F (37.2 C) 98 F (36.7 C) 98.9 F (37.2 C) 99.9 F (37.7 C)  TempSrc: Oral  Oral Oral  SpO2: 100% 100% 99% 99%  Weight:      Height:        Intake/Output Summary (Last 24 hours) at 11/26/2021 1634 Last data filed at 11/26/2021 1200 Gross per 24 hour  Intake 1005.86 ml  Output 650 ml  Net 355.86 ml   Filed Weights   11/25/21 1218  Weight: 87.5 kg    Examination:   Constitutional: NAD, AAOx3 HEENT: conjunctivae and lids normal, EOMI CV: No cyanosis.   RESP: normal respiratory effort, on RA Extremities: No effusions, edema in BLE SKIN: warm, dry Neuro: II - XII grossly intact.   Psych: depressed mood and affect.  Appropriate judgement and reason   Data Reviewed: I have personally reviewed following labs and imaging studies  CBC: Recent Labs  Lab 11/25/21 1223 11/25/21 2309  11/26/21 0545  WBC 10.4 9.0 10.9*  NEUTROABS 6.8  --   --   HGB 10.2* 9.1* 9.5*  HCT 33.0* 29.5* 30.8*  MCV 86.4 86.5 84.8  PLT 569* 498* 517*   Basic Metabolic Panel: Recent Labs  Lab 11/25/21 1223 11/25/21 2309 11/26/21 0545  NA 135  --  135  K 3.2*  --  3.4*  CL 96*  --  102  CO2 30  --  26  GLUCOSE 148*  --  182*  BUN 6  --  <5*  CREATININE 0.47 0.45 0.48  CALCIUM 9.5  --  8.4*  MG 1.4*  --  1.7   GFR: Estimated Creatinine Clearance: 92 mL/min (by C-G formula  based on SCr of 0.48 mg/dL). Liver Function Tests: Recent Labs  Lab 11/25/21 1223 11/26/21 0545  AST 21 18  ALT 27 21  ALKPHOS 67 68  BILITOT 0.8 0.5  PROT 8.5* 7.2  ALBUMIN 3.1* 2.6*   Recent Labs  Lab 11/25/21 1223  LIPASE 25   No results for input(s): AMMONIA in the last 168 hours. Coagulation Profile: No results for input(s): INR, PROTIME in the last 168 hours. Cardiac Enzymes: No results for input(s): CKTOTAL, CKMB, CKMBINDEX, TROPONINI in the last 168 hours. BNP (last 3 results) No results for input(s): PROBNP in the last 8760 hours. HbA1C: No results for input(s): HGBA1C in the last 72 hours. CBG: No results for input(s): GLUCAP in the last 168 hours. Lipid Profile: No results for input(s): CHOL, HDL, LDLCALC, TRIG, CHOLHDL, LDLDIRECT in the last 72 hours. Thyroid Function Tests: No results for input(s): TSH, T4TOTAL, FREET4, T3FREE, THYROIDAB in the last 72 hours. Anemia Panel: No results for input(s): VITAMINB12, FOLATE, FERRITIN, TIBC, IRON, RETICCTPCT in the last 72 hours. Sepsis Labs: No results for input(s): PROCALCITON, LATICACIDVEN in the last 168 hours.  Recent Results (from the past 240 hour(s))  Resp Panel by RT-PCR (Flu A&B, Covid) Nasopharyngeal Swab     Status: None   Collection Time: 11/25/21  3:44 PM   Specimen: Nasopharyngeal Swab; Nasopharyngeal(NP) swabs in vial transport medium  Result Value Ref Range Status   SARS Coronavirus 2 by RT PCR NEGATIVE NEGATIVE Final    Comment: (NOTE) SARS-CoV-2 target nucleic acids are NOT DETECTED.  The SARS-CoV-2 RNA is generally detectable in upper respiratory specimens during the acute phase of infection. The lowest concentration of SARS-CoV-2 viral copies this assay can detect is 138 copies/mL. A negative result does not preclude SARS-Cov-2 infection and should not be used as the sole basis for treatment or other patient management decisions. A negative result may occur with  improper specimen  collection/handling, submission of specimen other than nasopharyngeal swab, presence of viral mutation(s) within the areas targeted by this assay, and inadequate number of viral copies(<138 copies/mL). A negative result must be combined with clinical observations, patient history, and epidemiological information. The expected result is Negative.  Fact Sheet for Patients:  BloggerCourse.com  Fact Sheet for Healthcare Providers:  SeriousBroker.it  This test is no t yet approved or cleared by the [place] FDA and  has been authorized for detection and/or diagnosis of SARS-CoV-2 by FDA under an Emergency Use Authorization (EUA). This EUA will remain  in effect (meaning this test can be used) for the duration of the COVID-19 declaration under Section 564(b)(1) of the Act, 21 U.S.C.section 360bbb-3(b)(1), unless the authorization is terminated  or revoked sooner.       Influenza A by PCR NEGATIVE NEGATIVE Final  Influenza B by PCR NEGATIVE NEGATIVE Final    Comment: (NOTE) The Xpert Xpress SARS-CoV-2/FLU/RSV plus assay is intended as an aid in the diagnosis of influenza from Nasopharyngeal swab specimens and should not be used as a sole basis for treatment. Nasal washings and aspirates are unacceptable for Xpert Xpress SARS-CoV-2/FLU/RSV testing.  Fact Sheet for Patients: BloggerCourse.com  Fact Sheet for Healthcare Providers: SeriousBroker.it  This test is not yet approved or cleared by the [place] FDA and has been authorized for detection and/or diagnosis of SARS-CoV-2 by FDA under an Emergency Use Authorization (EUA). This EUA will remain in effect (meaning this test can be used) for the duration of the COVID-19 declaration under Section 564(b)(1) of the Act, 21 U.S.C. section 360bbb-3(b)(1), unless the authorization is terminated or revoked.  Performed at [facility], [address] [place], [place] [address]       Radiology Studies: CT ABDOMEN PELVIS W CONTRAST  Result Date: 11/25/2021 CLINICAL DATA:  Abdominal pain, nausea, vomiting, recent necrotizing pancreatitis EXAM: CT ABDOMEN AND PELVIS WITH CONTRAST TECHNIQUE: Multidetector CT imaging of the abdomen and pelvis was performed using the standard protocol following bolus administration of intravenous contrast. CONTRAST:  OMNIPAQUE IOHEXOL 300 MG/ML SOLN IV. No oral contrast. COMPARISON:  08/27/2021 Correlation: Report from an outside CT, [facility], 11/13/2021 FINDINGS: Lower chest: Lung bases clear Hepatobiliary: Gallbladder surgically absent. Few foci of air within biliary tree question prior ERCP. No biliary dilatation. Liver otherwise unremarkable. Pancreas: Post transgastric cyst gastrostomy drainage of pancreatic fluid collections, with 2 stents identified. Small residual fluid collection at the pancreatic tail 2.6 x 1.6 cm image 27. Extensive infiltrative changes of the peripancreatic soft tissues with ill-defined pancreatic bed. Foci of gas are seen at the head and tail portions of the pancreatic bed likely related to prior necrosis and drainage. Small residual fluid collection posterior to the pancreas 19 x 11 mm image 34, likely adjacent to the anterior margin of the IVC. Remainder of pancreatic bed demonstrates sequela of necrotizing pancreatitis with phlegmon. No new pancreatic bed fluid collections. Spleen: Normal appearance Adrenals/Urinary Tract: Adrenal glands, kidneys, ureters, and bladder normal appearance Stomach/Bowel: Thickening of gastric wall likely related to pancreatitis and surgery. Additional thickening is seen of the distal ascending through proximal descending colon likely due to pancreatitis and associated inflammatory changes. Appendix not visualized. Thickening of this regional small bowel loops in the upper abdomen similar to colon. Vascular/Lymphatic: Vascular  structures patent. SMV appears diminutive at the level of the pancreas but grossly patent. No adenopathy. Reproductive: Unremarkable adnexa.  IUD within uterus. Other: Small amount of nonspecific low-attenuation free fluid in pelvis. Diffuse infiltrative changes of anterior and posterior pararenal space fat likely inflammatory related to preceding pancreatitis. Musculoskeletal: No acute osseous findings. IMPRESSION: Post transgastric cyst gastrostomy drainage of pancreatic fluid collections with two small residual fluid collections as above. Changes of necrotizing pancreatitis with significant infiltrative changes of the peripancreatic soft tissues, including gastric wall, distal ascending colon, and proximal descending colon as well as diffuse thickening of tissues within the peritoneum, retroperitoneum, and mesentery. Small amount of nonspecific low-attenuation free fluid in pelvis. Few foci of air within biliary tree question prior ERCP. Electronically Signed   By: Ulyses Southward M.D.   On: 11/25/2021 18:43     Scheduled Meds:  amLODipine  2.5 mg Oral Daily   ARIPiprazole  10 mg Oral Daily   enoxaparin (LOVENOX) injection  0.5 mg/kg Subcutaneous Q24H   feeding supplement  1 Container Oral TID  BM   [START ON 11/27/2021] influenza vac split quadrivalent PF  0.5 mL Intramuscular Tomorrow-1000   lipase/protease/amylase  36,000 Units Oral TID WC   pantoprazole (PROTONIX) IV  40 mg Intravenous Q24H   Continuous Infusions:   LOS: 1 day     Darlin Priestly, MD [facility] If 7PM-7AM, please contact night-coverage 11/26/2021, 4:34 PM

## 2021-11-26 NOTE — Progress Notes (Signed)
Dr. Para March notified that Pt. Has had large bile colored emesis and last received Zofran at 0325. Request for antiemetic placed. Phenergan ordered and awaiting medication from Pharmacy.

## 2021-11-26 NOTE — ED Notes (Signed)
Report given to Kennis Carina, Mother Baby unit.

## 2021-11-27 ENCOUNTER — Inpatient Hospital Stay: Payer: Medicaid Other

## 2021-11-27 DIAGNOSIS — K8591 Acute pancreatitis with uninfected necrosis, unspecified: Secondary | ICD-10-CM

## 2021-11-27 DIAGNOSIS — R1110 Vomiting, unspecified: Secondary | ICD-10-CM | POA: Diagnosis not present

## 2021-11-27 DIAGNOSIS — R112 Nausea with vomiting, unspecified: Secondary | ICD-10-CM | POA: Diagnosis not present

## 2021-11-27 LAB — PROCALCITONIN: Procalcitonin: 1.53 ng/mL

## 2021-11-27 LAB — BASIC METABOLIC PANEL
Anion gap: 6 (ref 5–15)
BUN: <5 mg/dL — ABNORMAL LOW (ref 6–20)
CO2: 27 mmol/L (ref 22–32)
Calcium: 8.7 mg/dL — ABNORMAL LOW (ref 8.9–10.3)
Chloride: 102 mmol/L (ref 98–111)
Creatinine, Ser: 0.55 mg/dL (ref 0.44–1.00)
GFR, Estimated: >60 mL/min (ref >60)
Glucose, Bld: 147 mg/dL — ABNORMAL HIGH (ref 70–99)
Potassium: 3.3 mmol/L — ABNORMAL LOW (ref 3.5–5.1)
Sodium: 135 mmol/L (ref 135–145)

## 2021-11-27 LAB — CBC
HCT: 28.2 % — ABNORMAL LOW (ref 36.0–46.0)
Hemoglobin: 8.7 g/dL — ABNORMAL LOW (ref 12.0–15.0)
MCH: 26.2 pg (ref 26.0–34.0)
MCHC: 30.9 g/dL (ref 30.0–36.0)
MCV: 84.9 fL (ref 80.0–100.0)
Platelets: 446 10*3/uL — ABNORMAL HIGH (ref 150–400)
RBC: 3.32 MIL/uL — ABNORMAL LOW (ref 3.87–5.11)
RDW: 15.4 % (ref 11.5–15.5)
WBC: 13.2 10*3/uL — ABNORMAL HIGH (ref 4.0–10.5)
nRBC: 0 % (ref 0.0–0.2)

## 2021-11-27 LAB — PANCREATIC ELASTASE, FECAL: Pancreatic Elastase-1, Stool: 427 ug Elast./g (ref >200)

## 2021-11-27 LAB — MAGNESIUM: Magnesium: 1.4 mg/dL — ABNORMAL LOW (ref 1.7–2.4)

## 2021-11-27 MED ORDER — SODIUM CHLORIDE 0.9 % IV SOLN
2.0000 g | Freq: Three times a day (TID) | INTRAVENOUS | Status: DC
  Filled 2021-11-27 (×4): qty 2

## 2021-11-27 MED ORDER — POTASSIUM CHLORIDE 10 MEQ/100ML IV SOLN
10.0000 meq | INTRAVENOUS | Status: AC
  Administered 2021-11-27 (×4): 10 meq via INTRAVENOUS
  Filled 2021-11-27 (×4): qty 100

## 2021-11-27 MED ORDER — METRONIDAZOLE 500 MG/100ML IV SOLN
500.0000 mg | Freq: Two times a day (BID) | INTRAVENOUS | Status: DC
  Filled 2021-11-27: qty 100

## 2021-11-27 MED ORDER — MAGNESIUM SULFATE 2 GM/50ML IV SOLN
2.0000 g | Freq: Once | INTRAVENOUS | Status: AC
  Administered 2021-11-27: 2 g via INTRAVENOUS
  Filled 2021-11-27: qty 50

## 2021-11-27 MED ORDER — SODIUM CHLORIDE 0.9 % IV SOLN: INTRAVENOUS | Status: DC | PRN

## 2021-11-27 MED ORDER — LACTATED RINGERS IV SOLN: INTRAVENOUS | Status: AC

## 2021-11-27 MED ORDER — SODIUM CHLORIDE 0.9 % IV SOLN
12.5000 mg | Freq: Three times a day (TID) | INTRAVENOUS | Status: AC | PRN
  Administered 2021-11-27 (×2): 12.5 mg via INTRAVENOUS
  Filled 2021-11-27: qty 0.5
  Filled 2021-11-27: qty 12.5

## 2021-11-27 NOTE — Progress Notes (Signed)
Arlyss Repress, MD [address]  [address]  [place], [place] [address]  Main: [phone]  Fax: [phone] Pager: [phone]   Subjective: Patient denies abdominal pain, nausea.  She is spitting up phlegm.  She states she is tolerating clear liquids.  Patient had fever of 100.4 at midnight.  She was given Tylenol.  Her WBC count slightly increased from 10.9 to 13.2 within last 24 hours.  She received IV iron yesterday evening and this afternoon.  Has been hemodynamically stable.  Blood cultures drawn   Objective: Vital signs in last 24 hours: Vitals:   11/27/21 0220 11/27/21 0410 11/27/21 0755 11/27/21 1541  BP:  124/67 (!) 141/80 (!) 144/79  Pulse:  (!) 105 93 99  Resp:  18 20 18   Temp: 100.1 F (37.8 C) 99.1 F (37.3 C) 99.3 F (37.4 C) (!) 100.5 F (38.1 C)  TempSrc: Oral Oral Oral Oral  SpO2:  95% 99% 99%  Weight:      Height:       Weight change:   Intake/Output Summary (Last 24 hours) at 11/27/2021 1554 Last data filed at 11/27/2021 1347 Gross per 24 hour  Intake 950.67 ml  Output 601 ml  Net 349.67 ml     Exam: Heart:: Regular rate and rhythm, S1S2 present, or without murmur or extra heart sounds Lungs: normal and clear to auscultation Abdomen: soft, nontender, normal bowel sounds   Lab Results: CBC Latest Ref Rng & Units 11/27/2021 11/26/2021 11/25/2021  WBC 4.0 - 10.5 K/uL 13.2(H) 10.9(H) 9.0  Hemoglobin 12.0 - 15.0 g/dL 11/27/2021) 6.5(K) 8.1(E)  Hematocrit 36.0 - 46.0 % 28.2(L) 30.8(L) 29.5(L)  Platelets 150 - 400 K/uL 446(H) 517(H) 498(H)   CMP Latest Ref Rng & Units 11/27/2021 11/26/2021 11/25/2021  Glucose 70 - 99 mg/dL 11/27/2021) 700(F) -  BUN 6 - 20 mg/dL 749(S) <4(H) -  Creatinine 0.44 - 1.00 mg/dL <6(P 5.91 6.38  Sodium 135 - 145 mmol/L 135 135 -  Potassium 3.5 - 5.1 mmol/L 3.3(L) 3.4(L) -  Chloride 98 - 111 mmol/L 102 102 -  CO2 22 - 32 mmol/L 27 26 -  Calcium 8.9 - 10.3 mg/dL 4.66) 5.9(D) -  Total Protein 6.5 - 8.1 g/dL - 7.2 -   Total Bilirubin 0.3 - 1.2 mg/dL - 0.5 -  Alkaline Phos 38 - 126 U/L - 68 -  AST 15 - 41 U/L - 18 -  ALT 0 - 44 U/L - 21 -    Micro Results: Recent Results (from the past 240 hour(s))  Resp Panel by RT-PCR (Flu A&B, Covid) Nasopharyngeal Swab     Status: None   Collection Time: 11/25/21  3:44 PM   Specimen: Nasopharyngeal Swab; Nasopharyngeal(NP) swabs in vial transport medium  Result Value Ref Range Status   SARS Coronavirus 2 by RT PCR NEGATIVE NEGATIVE Final    Comment: (NOTE) SARS-CoV-2 target nucleic acids are NOT DETECTED.  The SARS-CoV-2 RNA is generally detectable in upper respiratory specimens during the acute phase of infection. The lowest concentration of SARS-CoV-2 viral copies this assay can detect is 138 copies/mL. A negative result does not preclude SARS-Cov-2 infection and should not be used as the sole basis for treatment or other patient management decisions. A negative result may occur with  improper specimen collection/handling, submission of specimen other than nasopharyngeal swab, presence of viral mutation(s) within the areas targeted by this assay, and inadequate number of viral copies(<138 copies/mL). A negative result must be combined with clinical observations,  patient history, and epidemiological information. The expected result is Negative.  Fact Sheet for Patients:  EntrepreneurPulse.com.au  Fact Sheet for Healthcare Providers:  IncredibleEmployment.be  This test is no t yet approved or cleared by the [place] FDA and  has been authorized for detection and/or diagnosis of SARS-CoV-2 by FDA under an Emergency Use Authorization (EUA). This EUA will remain  in effect (meaning this test can be used) for the duration of the COVID-19 declaration under Section 564(b)(1) of the Act, 21 U.S.C.section 360bbb-3(b)(1), unless the authorization is terminated  or revoked sooner.       Influenza A by PCR NEGATIVE  NEGATIVE Final   Influenza B by PCR NEGATIVE NEGATIVE Final    Comment: (NOTE) The Xpert Xpress SARS-CoV-2/FLU/RSV plus assay is intended as an aid in the diagnosis of influenza from Nasopharyngeal swab specimens and should not be used as a sole basis for treatment. Nasal washings and aspirates are unacceptable for Xpert Xpress SARS-CoV-2/FLU/RSV testing.  Fact Sheet for Patients: EntrepreneurPulse.com.au  Fact Sheet for Healthcare Providers: IncredibleEmployment.be  This test is not yet approved or cleared by the [place] FDA and has been authorized for detection and/or diagnosis of SARS-CoV-2 by FDA under an Emergency Use Authorization (EUA). This EUA will remain in effect (meaning this test can be used) for the duration of the COVID-19 declaration under Section 564(b)(1) of the Act, 21 U.S.C. section 360bbb-3(b)(1), unless the authorization is terminated or revoked.  Performed at [facility], [address] [place], [place] [address]    Studies/Results: CT ABDOMEN PELVIS W CONTRAST  Result Date: 11/25/2021 CLINICAL DATA:  Abdominal pain, nausea, vomiting, recent necrotizing pancreatitis EXAM: CT ABDOMEN AND PELVIS WITH CONTRAST TECHNIQUE: Multidetector CT imaging of the abdomen and pelvis was performed using the standard protocol following bolus administration of intravenous contrast. CONTRAST:  153mL OMNIPAQUE IOHEXOL 300 MG/ML SOLN IV. No oral contrast. COMPARISON:  08/27/2021 Correlation: Report from an outside CT, [facility], 11/13/2021 FINDINGS: Lower chest: Lung bases clear Hepatobiliary: Gallbladder surgically absent. Few foci of air within biliary tree question prior ERCP. No biliary dilatation. Liver otherwise unremarkable. Pancreas: Post transgastric cyst gastrostomy drainage of pancreatic fluid collections, with 2 stents identified. Small residual fluid collection at the pancreatic tail 2.6 x 1.6 cm image 27. Extensive  infiltrative changes of the peripancreatic soft tissues with ill-defined pancreatic bed. Foci of gas are seen at the head and tail portions of the pancreatic bed likely related to prior necrosis and drainage. Small residual fluid collection posterior to the pancreas 19 x 11 mm image 34, likely adjacent to the anterior margin of the IVC. Remainder of pancreatic bed demonstrates sequela of necrotizing pancreatitis with phlegmon. No new pancreatic bed fluid collections. Spleen: Normal appearance Adrenals/Urinary Tract: Adrenal glands, kidneys, ureters, and bladder normal appearance Stomach/Bowel: Thickening of gastric wall likely related to pancreatitis and surgery. Additional thickening is seen of the distal ascending through proximal descending colon likely due to pancreatitis and associated inflammatory changes. Appendix not visualized. Thickening of this regional small bowel loops in the upper abdomen similar to colon. Vascular/Lymphatic: Vascular structures patent. SMV appears diminutive at the level of the pancreas but grossly patent. No adenopathy. Reproductive: Unremarkable adnexa.  IUD within uterus. Other: Small amount of nonspecific low-attenuation free fluid in pelvis. Diffuse infiltrative changes of anterior and posterior pararenal space fat likely inflammatory related to preceding pancreatitis. Musculoskeletal: No acute osseous findings. IMPRESSION: Post transgastric cyst gastrostomy drainage of pancreatic fluid collections with two small residual fluid collections as above. Changes  of necrotizing pancreatitis with significant infiltrative changes of the peripancreatic soft tissues, including gastric wall, distal ascending colon, and proximal descending colon as well as diffuse thickening of tissues within the peritoneum, retroperitoneum, and mesentery. Small amount of nonspecific low-attenuation free fluid in pelvis. Few foci of air within biliary tree question prior ERCP. Electronically Signed   By:  Ulyses Southward M.D.   On: 11/25/2021 18:43   Medications: I have reviewed the patient's current medications. Prior to Admission:  Medications Prior to Admission  Medication Sig Dispense Refill Last Dose   amLODipine (NORVASC) 2.5 MG tablet Take 2.5 mg by mouth daily.   Past Week   aripiprazole (ABILIFY) 10 MG disintegrating tablet Take 10 mg by mouth in the morning and at bedtime.   Past Week   gabapentin (NEURONTIN) 300 MG capsule Take 300 mg by mouth 2 (two) times a week.   Past Week   hydrOXYzine (ATARAX/VISTARIL) 10 MG tablet Take 10 mg by mouth every 6 (six) hours as needed.   Past Week   omeprazole (PRILOSEC) 10 MG capsule Take 1 capsule (10 mg total) by mouth daily. 30 capsule 11 Past Week   prazosin (MINIPRESS) 2 MG capsule Take 2 mg by mouth at bedtime.   Past Week   traZODone (DESYREL) 100 MG tablet Take 150-300 mg by mouth at bedtime.   Past Week   albuterol (PROVENTIL HFA;VENTOLIN HFA) 108 (90 Base) MCG/ACT inhaler Inhale 2 puffs into the lungs every 6 (six) hours as needed for wheezing or shortness of breath.   prn at unknown   citalopram (CELEXA) 10 MG tablet Take by mouth. (Patient not taking: Reported on 11/26/2021)   Not Taking   clonazePAM (KLONOPIN) 0.5 MG tablet Take 1 tablet by mouth 2 (two) times daily. As needed (Patient not taking: Reported on 11/08/2020)   Not Taking   famotidine (PEPCID) 20 MG tablet Take 1 tablet (20 mg total) by mouth 2 (two) times daily. (Patient not taking: Reported on 11/26/2021) 60 tablet 0 Not Taking   lactulose (CHRONULAC) 10 GM/15ML solution Take 30 mLs (20 g total) by mouth daily as needed for mild constipation. 120 mL 0 prn at unknown   lidocaine (LIDODERM) 5 % Place 1 patch onto the skin daily. Remove & Discard patch within 12 hours or as directed by MD   prn at unknown   liraglutide (VICTOZA) 18 MG/3ML SOPN Inject 0.6 mg into the skin daily. (Patient not taking: Reported on 11/26/2021)   Not Taking   metoCLOPramide (REGLAN) 10 MG tablet Take 1  tablet (10 mg total) by mouth every 8 (eight) hours as needed for nausea. 20 tablet 0 prn at unknown   oxybutynin (DITROPAN-XL) 10 MG 24 hr tablet Take 10 mg by mouth at bedtime. (Patient not taking: Reported on 11/08/2020)   Not Taking   propranolol (INDERAL) 20 MG tablet Take 10 mg by mouth 2 (two) times daily. (Patient not taking: Reported on 11/08/2020)   Not Taking   sucralfate (CARAFATE) 1 GM/10ML suspension Take 10 mLs (1 g total) by mouth 4 (four) times daily. 420 mL 1 prn at unknown   sulfamethoxazole-trimethoprim (BACTRIM DS) 800-160 MG tablet Take 1 tablet by mouth 2 (two) times daily. (Patient not taking: Reported on 11/26/2021) 14 tablet 0 Not Taking   Tiotropium Bromide Monohydrate (SPIRIVA RESPIMAT) 1.25 MCG/ACT AERS Inhale 2 puffs into the lungs daily for 1 day. 4 g 0    Scheduled:  amLODipine  2.5 mg Oral Daily   ARIPiprazole  10 mg Oral Daily   enoxaparin (LOVENOX) injection  0.5 mg/kg Subcutaneous Q24H   feeding supplement  1 Container Oral TID BM   influenza vac split quadrivalent PF  0.5 mL Intramuscular Tomorrow-1000   lipase/protease/amylase  36,000 Units Oral TID WC   pantoprazole (PROTONIX) IV  40 mg Intravenous Q24H   Continuous:  sodium chloride Stopped (11/27/21 0220)   lactated ringers 50 mL/hr at 11/27/21 1347   promethazine (PHENERGAN) injection (IM or IVPB) Stopped (11/27/21 0145)   [id number] chloride, albuterol, alum & mag hydroxide-simeth, HYDROcodone-acetaminophen, ondansetron **OR** ondansetron (ZOFRAN) IV, promethazine (PHENERGAN) injection (IM or IVPB), promethazine Anti-infectives (From admission, onward)    Start     Dose/Rate Route Frequency Ordered Stop   11/27/21 1630  ceFEPIme (MAXIPIME) 2 g in sodium chloride 0.9 % 100 mL IVPB  Status:  Discontinued        2 g 200 mL/hr over 30 Minutes Intravenous Every 8 hours 11/27/21 1513 11/27/21 1527   11/27/21 1630  metroNIDAZOLE (FLAGYL) IVPB 500 mg  Status:  Discontinued        500 mg 100 mL/hr over  60 Minutes Intravenous Every 12 hours 11/27/21 1513 11/27/21 1527   11/25/21 2015  cefTRIAXone (ROCEPHIN) 1 g in sodium chloride 0.9 % 100 mL IVPB        1 g 200 mL/hr over 30 Minutes Intravenous  Once 11/25/21 2002 11/25/21 2051      Scheduled Meds:  amLODipine  2.5 mg Oral Daily   ARIPiprazole  10 mg Oral Daily   enoxaparin (LOVENOX) injection  0.5 mg/kg Subcutaneous Q24H   feeding supplement  1 Container Oral TID BM   influenza vac split quadrivalent PF  0.5 mL Intramuscular Tomorrow-1000   lipase/protease/amylase  36,000 Units Oral TID WC   pantoprazole (PROTONIX) IV  40 mg Intravenous Q24H   Continuous Infusions:  sodium chloride Stopped (11/27/21 0220)   lactated ringers 50 mL/hr at 11/27/21 1347   promethazine (PHENERGAN) injection (IM or IVPB) Stopped (11/27/21 0145)   PRN Meds:.sodium chloride, albuterol, alum & mag hydroxide-simeth, HYDROcodone-acetaminophen, ondansetron **OR** ondansetron (ZOFRAN) IV, promethazine (PHENERGAN) injection (IM or IVPB), promethazine   Assessment: Principal Problem:   Intractable vomiting Active Problems:   Asthma   Bipolar 1 disorder, depressed (HCC)   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 pleasant African-American female with history of post ERCP acute necrotizing pancreatitis s/p cyst gastrostomy in 09/2021 is admitted with 1 day history of intractable nausea and vomiting.  There is no evidence of recurrence of acute pancreatitis.  Plan: Nausea and vomiting Patient's nausea and vomiting significantly improved yesterday.  She is spitting up phlegm today.  She is tolerating clear liquids Continue antiemetics as needed  Febrile episode with leukocytosis Blood cultures drawn ?  Secondary to parenteral iron, received 2 doses or from underlying necrotizing  pancreatitis If patient continues to have fever with worsening leukocytosis, recommend to start antibiotics Patient currently does not appear septic  Post ERCP necrotizing pancreatitis with walled off necrosis, s/p cyst gastrostomy and necrosectomy Patient is scheduled to have an outpatient EGD at [facility] tomorrow at 11 AM If patient does not have fever for next 24 hours with improvement in leukocytosis without antibiotics, patient can be discharged home tomorrow morning for upper endoscopy at [facility].  If she continues to have fever, patient  will need to be started on broad-spectrum antibiotics and transferred to tertiary care facility Patient states that her upper endoscopy has been rescheduled twice at [facility] and if she does not show for EGD tomorrow it will not be rescheduled N.p.o. after midnight  GI will follow along with you   LOS: 2 days   Bettyjean Stefanski 11/27/2021, 3:54 PM

## 2021-11-27 NOTE — Progress Notes (Signed)
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 female with medical history significant for Asthma, bipolar 1, HTN, seizures, recent prolonged hospitalization at [facility] from 9/28-11/17 for post ERCP necrotizing pancreatitis, complicated by respiratory failure requiring trach from 10/25-11/14, who is s/p cyst gastrostomy on 10/26 with improvement and s/p necrosectomy on 11/3 and 11/9, discharged on 11/17, who was doing fairly well until several hours prior to admission when she developed nausea and vomiting, with inability to keep down anything except ice chips and Pedialyte.    Nausea and vomiting, improved -Denies abdominal pain and lipase and liver enzymes WNL - CT abdomen showing residual findings from prior necrosectomy and appears nonacute --GI consulted Plan: --advance to solid diet --d/c MIVF --anti-emetics PRN  Fever and SIRS --temp 100.4 overnight then given tylenol.  Temp 100.5 later this afternoon while receiving IV iron.  WBC mildly increased, procal 1.53.  Pt did not appear septic and reported feeling better.  Per GI, IV iron could cause inflammatory response. --blood cx obtained --Discussed with GI Dr. Allegra , If patient does not have fever for next 24 hours with improvement in leukocytosis without antibiotics, patient can be discharged home tomorrow morning for upper endoscopy at [facility].  Patient states that her upper endoscopy has been rescheduled twice at [facility] and if she does not show for EGD tomorrow it will not be rescheduled. Plan: --Hold abx and  monitor for fever --Do not give Tylenol for fever to assess for max temp --If pt becomes hypotensive, then start cefepime and flagyl  S/p ERCP necrotizing pancreatitis with walled off necrosis, s/p cyst gastrostomy and necrosectomy --If no fever, pt needs to be discharged by 8 am tomorrow 11/30 to drive to [facility] for her scheduled EGD.   --NPO MN  Hypomagnesemia and hypokalemia --monitor and replete PRN   UTI, ruled out -Received a dose of Rocephin in the ED --Pt denied dysuria.   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 --currently not having diarrhea --cont Creon, per GI rec     Asthma - As needed DuoNebs     Bipolar 1 disorder, depressed (HCC) - cont home abilify     HTN  --cont amlodipine  Anemia of chronic disease --s/p IV iron x2 --avoid further IV iron   DVT prophylaxis: Lovenox SQ Code Status: Full code  Family Communication:  Level of care: Med-Surg Dispo:   The patient is from: home Anticipated d/c is to: home Anticipated d/c date is: tomorrow at 8 am if no fever overnight  Patient currently is not medically ready to d/c due to: need to monitor for fever off of abx   Subjective and Interval History:  Overnight, pt had a fever to 100.4 and received tylenol.  Later in the afternoon also temp to 100.5, while infusing IV iron.  Pt denied abdominal pain, just felt gassy.  No more vomiting since around midnight.    Discussed with GI Dr. Allegra , and decision made to hold abx and monitor for fever.   Objective: Vitals:  11/27/21 0410 11/27/21 0755 11/27/21 1541 11/27/21 1952  BP: 124/67 (!) 141/80 (!) 144/79 139/72  Pulse: (!) 105 93 99 (!) 104  Resp: 18 20 18    Temp: 99.1 F (37.3 C) 99.3 F (37.4 C) (!) 100.5 F (38.1 C) (!) 100.5 F (38.1 C)  TempSrc: Oral Oral Oral Oral  SpO2: 95% 99% 99% 99%  Weight:      Height:        Intake/Output Summary (Last 24 hours) at 11/27/2021 1957 Last data filed at 11/27/2021  1927 Gross per 24 hour  Intake 1065.42 ml  Output 1251 ml  Net -185.58 ml   Filed Weights   11/25/21 1218  Weight: 87.5 kg    Examination:   Constitutional: NAD, AAOx3 HEENT: conjunctivae and lids normal, EOMI CV: No cyanosis.   RESP: normal respiratory effort, on RA Extremities: No effusions, edema in BLE SKIN: warm, dry Neuro: II - XII grossly intact.   Psych: depressed mood and affect.  Appropriate judgement and reason   Data Reviewed: I have personally reviewed following labs and imaging studies  CBC: Recent Labs  Lab 11/25/21 1223 11/25/21 2309 11/26/21 0545 11/27/21 0459  WBC 10.4 9.0 10.9* 13.2*  NEUTROABS 6.8  --   --   --   HGB 10.2* 9.1* 9.5* 8.7*  HCT 33.0* 29.5* 30.8* 28.2*  MCV 86.4 86.5 84.8 84.9  PLT 569* 498* 517* 446*   Basic Metabolic Panel: Recent Labs  Lab 11/25/21 1223 11/25/21 2309 11/26/21 0545 11/27/21 0459  NA 135  --  135 135  K 3.2*  --  3.4* 3.3*  CL 96*  --  102 102  CO2 30  --  26 27  GLUCOSE 148*  --  182* 147*  BUN 6  --  <5* <5*  CREATININE 0.47 0.45 0.48 0.55  CALCIUM 9.5  --  8.4* 8.7*  MG 1.4*  --  1.7 1.4*   GFR: Estimated Creatinine Clearance: 92 mL/min (by C-G formula based on SCr of 0.55 mg/dL). Liver Function Tests: Recent Labs  Lab 11/25/21 1223 11/26/21 0545  AST 21 18  ALT 27 21  ALKPHOS 67 68  BILITOT 0.8 0.5  PROT 8.5* 7.2  ALBUMIN 3.1* 2.6*   Recent Labs  Lab 11/25/21 1223  LIPASE 25   No results for input(s): AMMONIA in the last 168 hours. Coagulation Profile: No results for input(s): INR, PROTIME in the last 168 hours. Cardiac Enzymes: No results for input(s): CKTOTAL, CKMB, CKMBINDEX, TROPONINI in the last 168 hours. BNP (last 3 results) No results for input(s): PROBNP in the last 8760 hours. HbA1C: No results for input(s): HGBA1C in the last 72 hours. CBG: No results for input(s): GLUCAP in the last 168 hours. Lipid Profile: No results for input(s): CHOL, HDL, LDLCALC, TRIG,  CHOLHDL, LDLDIRECT in the last 72 hours. Thyroid Function Tests: No results for input(s): TSH, T4TOTAL, FREET4, T3FREE, THYROIDAB in the last 72 hours. Anemia Panel: Recent Labs    11/26/21 1546  VITAMINB12 335  FOLATE 11.1  FERRITIN 113  TIBC 211*  IRON 9*   Sepsis Labs: Recent Labs  Lab 11/27/21 1004  PROCALCITON 1.53    Recent Results (from the past 240 hour(s))  Resp Panel by RT-PCR (Flu A&B, Covid) Nasopharyngeal Swab     Status: None   Collection Time: 11/25/21  3:44 PM   Specimen: Nasopharyngeal Swab; Nasopharyngeal(NP) swabs in vial transport medium  Result Value Ref Range Status   SARS Coronavirus 2 by RT PCR NEGATIVE  NEGATIVE Final    Comment: (NOTE) SARS-CoV-2 target nucleic acids are NOT DETECTED.  The SARS-CoV-2 RNA is generally detectable in upper respiratory specimens during the acute phase of infection. The lowest concentration of SARS-CoV-2 viral copies this assay can detect is 138 copies/mL. A negative result does not preclude SARS-Cov-2 infection and should not be used as the sole basis for treatment or other patient management decisions. A negative result may occur with  improper specimen collection/handling, submission of specimen other than nasopharyngeal swab, presence of viral mutation(s) within the areas targeted by this assay, and inadequate number of viral copies(<138 copies/mL). A negative result must be combined with clinical observations, patient history, and epidemiological information. The expected result is Negative.  Fact Sheet for Patients:  BloggerCourse.com  Fact Sheet for Healthcare Providers:  SeriousBroker.it  This test is no t yet approved or cleared by the [place] FDA and  has been authorized for detection and/or diagnosis of SARS-CoV-2 by FDA under an Emergency Use Authorization (EUA). This EUA will remain  in effect (meaning this test can be used) for the duration of  the COVID-19 declaration under Section 564(b)(1) of the Act, 21 U.S.C.section 360bbb-3(b)(1), unless the authorization is terminated  or revoked sooner.       Influenza A by PCR NEGATIVE NEGATIVE Final   Influenza B by PCR NEGATIVE NEGATIVE Final    Comment: (NOTE) The Xpert Xpress SARS-CoV-2/FLU/RSV plus assay is intended as an aid in the diagnosis of influenza from Nasopharyngeal swab specimens and should not be used as a sole basis for treatment. Nasal washings and aspirates are unacceptable for Xpert Xpress SARS-CoV-2/FLU/RSV testing.  Fact Sheet for Patients: BloggerCourse.com  Fact Sheet for Healthcare Providers: SeriousBroker.it  This test is not yet approved or cleared by the [place] FDA and has been authorized for detection and/or diagnosis of SARS-CoV-2 by FDA under an Emergency Use Authorization (EUA). This EUA will remain in effect (meaning this test can be used) for the duration of the COVID-19 declaration under Section 564(b)(1) of the Act, 21 U.S.C. section 360bbb-3(b)(1), unless the authorization is terminated or revoked.  Performed at [facility], [address] [place], [place] [address]       Radiology Studies: [facility] Chest [place] 1 View  Result Date: 11/27/2021 CLINICAL DATA:  Sepsis.  Pancreatitis. EXAM: PORTABLE CHEST 1 VIEW COMPARISON:  10/08/2020 FINDINGS: Heart size is normal. Mediastinal shadows are normal. There is mild atelectasis at the lung bases. No visible effusion. Stent in the left abdomen. IMPRESSION: Mild bibasilar atelectasis. Electronically Signed   By: Paulina Fusi M.D.   On: 11/27/2021 16:46     Scheduled Meds:  amLODipine  2.5 mg Oral Daily   ARIPiprazole  10 mg Oral Daily   enoxaparin (LOVENOX) injection  0.5 mg/kg Subcutaneous Q24H   feeding supplement  1 Container Oral TID BM   influenza vac split quadrivalent PF  0.5 mL Intramuscular Tomorrow-1000    lipase/protease/amylase  36,000 Units Oral TID WC   pantoprazole (PROTONIX) IV  40 mg Intravenous Q24H   Continuous Infusions:  sodium chloride Stopped (11/27/21 0220)   promethazine (PHENERGAN) injection (IM or IVPB) Stopped (11/27/21 0145)     LOS: 2 days     Darlin Priestly, MD [facility] If 7PM-7AM, please contact night-coverage 11/27/2021, 7:57 PM

## 2021-11-27 NOTE — Plan of Care (Signed)

## 2021-11-28 LAB — BASIC METABOLIC PANEL
Anion gap: 6 (ref 5–15)
BUN: <5 mg/dL — ABNORMAL LOW (ref 6–20)
CO2: 25 mmol/L (ref 22–32)
Calcium: 8.6 mg/dL — ABNORMAL LOW (ref 8.9–10.3)
Chloride: 101 mmol/L (ref 98–111)
Creatinine, Ser: 0.37 mg/dL — ABNORMAL LOW (ref 0.44–1.00)
GFR, Estimated: >60 mL/min (ref >60)
Glucose, Bld: 143 mg/dL — ABNORMAL HIGH (ref 70–99)
Potassium: 3.5 mmol/L (ref 3.5–5.1)
Sodium: 132 mmol/L — ABNORMAL LOW (ref 135–145)

## 2021-11-28 LAB — CBC
HCT: 28.9 % — ABNORMAL LOW (ref 36.0–46.0)
Hemoglobin: 9.3 g/dL — ABNORMAL LOW (ref 12.0–15.0)
MCH: 27 pg (ref 26.0–34.0)
MCHC: 32.2 g/dL (ref 30.0–36.0)
MCV: 84 fL (ref 80.0–100.0)
Platelets: 459 10*3/uL — ABNORMAL HIGH (ref 150–400)
RBC: 3.44 MIL/uL — ABNORMAL LOW (ref 3.87–5.11)
RDW: 15.6 % — ABNORMAL HIGH (ref 11.5–15.5)
WBC: 14.4 10*3/uL — ABNORMAL HIGH (ref 4.0–10.5)
nRBC: 0 % (ref 0.0–0.2)

## 2021-11-28 LAB — MAGNESIUM: Magnesium: 1.7 mg/dL (ref 1.7–2.4)

## 2021-11-28 LAB — COPPER, SERUM: Copper: 143 ug/dL (ref 80–158)

## 2021-11-28 LAB — PROCALCITONIN: Procalcitonin: 0.76 ng/mL

## 2021-11-28 NOTE — Progress Notes (Signed)
Pt requesting to leave AMA. MD paged and came to see patient. Pt still requesting to leave AMA, pt signed AMA papers and volunteers wheeled patient down to medical mall. Patient states she is going to [facility] for her scheduled ERCP.

## 2021-11-28 NOTE — Discharge Summary (Signed)
Physician [facility] Discharge Summary   Sierra Wiley  female DOB: [birth year]  ZOX:[id number]  PCP: [facility]  Admit date: 11/25/2021 Discharge date: 11/28/2021  Admitted From: home Disposition:  left AMA CODE STATUS: Full code   Hospital Course:  For full details, please see H&P, progress notes, consult notes and ancillary notes.  Briefly,  Sierra Wiley is a 50 y.o. female with medical history significant for Asthma, bipolar 1, HTN, seizures, recent prolonged hospitalization at [facility] from 9/28-11/17 for post ERCP necrotizing pancreatitis, complicated by respiratory failure requiring trach from 10/25-11/14, who is s/p cyst gastrostomy on 10/26 and s/p necrosectomy on 11/3 and 11/9, discharged on 11/17, who was doing fairly well until several hours prior to admission when she developed nausea and vomiting, with inability to keep down anything except ice chips and Pedialyte.    Fever and SIRS --Pt first had a fever of 100.4 midnight of 11/29, then multiple further febrile episodes throughout the day to 100.5.  GI thought it may be due to inflammatory response to IV iron given, however, given that WBC trending up daily from 9.0 to 14.4, and procal 1.53, infection could not be ruled out.  CXR clear, no dysuria, no diarrhea.  Blood cx obtained.  Likely source of infection could be her known necrotic pancrease.  GI had initially recommended discharging pt so pt can make it to her scheduled EGD at [facility] on 11/30 (which pt said couldn't be rescheduled), therefore, abx were held to see if fever would return, and unfortunately it did.  Pt therefore was not considered medically ready for discharge, however, pt wanted to leave in order to make her appointment at [facility], pt therefore left AMA.    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   RESP: normal respiratory effort, on RA Neuro: II - XII grossly intact.   Psych: Normal mood and affect.  Appropriate judgement and reason   Discharge Diagnoses:  Principal Problem:   Intractable vomiting Active Problems:   Asthma   Bipolar 1 disorder, depressed (HCC)   Hx of post ERCP necrotizing pancreatitis s/p cyst gastrostomy and necrosectomy   HTN (hypertension)   Seizures (HCC)   UTI (urinary tract infection)   Hypokalemia   Hypomagnesemia   30 Day Unplanned Readmission Risk Score    Flowsheet Row ED to Hosp-Admission (Discharged) from 11/25/2021 in [facility]  30 Day Unplanned Readmission Risk Score (%) 28.91 Filed at 11/28/2021 0801       This score is the patient's risk of an unplanned readmission within 30 days of being  discharged (0 -100%). The score is based on dignosis, age, lab data, medications, orders, and past utilization.   Low:  0-14.9   Medium: 15-21.9   High: 22-29.9   Extreme: 30 and above         Discharge Instructions:  Med Rec not done due to pt leaving AMA.    Allergies  Allergen Reactions   Penicillins Nausea And Vomiting      The results of significant diagnostics from this hospitalization (including imaging, microbiology, ancillary and laboratory) are listed below for reference.   Consultations:   Procedures/Studies: CT ABDOMEN PELVIS W CONTRAST  Result Date: 11/25/2021 CLINICAL DATA:  Abdominal pain, nausea, vomiting, recent necrotizing pancreatitis EXAM: CT ABDOMEN AND PELVIS WITH CONTRAST TECHNIQUE: Multidetector CT imaging of the abdomen and pelvis was performed using the standard protocol following bolus administration of intravenous contrast. CONTRAST:  OMNIPAQUE IOHEXOL 300 MG/ML SOLN IV. No oral contrast. COMPARISON:  08/27/2021 Correlation: Report from an outside CT, [facility], 11/13/2021 FINDINGS: Lower chest: Lung bases clear Hepatobiliary: Gallbladder surgically absent. Few foci of air within biliary tree question prior ERCP. No biliary dilatation. Liver otherwise unremarkable. Pancreas: Post transgastric cyst gastrostomy drainage of pancreatic fluid collections, with 2 stents identified. Small residual fluid collection at the pancreatic tail 2.6 x 1.6 cm image 27. Extensive infiltrative changes of the peripancreatic soft tissues with ill-defined pancreatic bed. Foci of gas are seen at the head and tail portions of the pancreatic bed likely related to prior necrosis and drainage. Small residual fluid collection posterior to the pancreas 19 x 11 mm image 34, likely adjacent to the anterior margin of the IVC. Remainder of pancreatic bed demonstrates sequela of necrotizing pancreatitis with phlegmon. No new pancreatic bed fluid collections. Spleen: Normal appearance Adrenals/Urinary Tract: Adrenal glands, kidneys, ureters, and bladder normal appearance Stomach/Bowel: Thickening of gastric wall likely related to pancreatitis and surgery. Additional thickening is seen of the distal ascending through proximal descending colon likely due to pancreatitis and associated inflammatory changes. Appendix not visualized.  Thickening of this regional small bowel loops in the upper abdomen similar to colon. Vascular/Lymphatic: Vascular structures patent. SMV appears diminutive at the level of the pancreas but grossly patent. No adenopathy. Reproductive: Unremarkable adnexa.  IUD within uterus. Other: Small amount of nonspecific low-attenuation free fluid in pelvis. Diffuse infiltrative changes of anterior and posterior pararenal space fat likely inflammatory related to preceding pancreatitis. Musculoskeletal: No acute osseous findings. IMPRESSION: Post transgastric cyst gastrostomy drainage of pancreatic fluid collections with two small residual fluid collections as above. Changes of necrotizing pancreatitis with significant infiltrative changes of the peripancreatic soft tissues, including gastric wall, distal ascending colon, and proximal descending colon as well as diffuse thickening of tissues within the peritoneum, retroperitoneum, and mesentery. Small amount of nonspecific low-attenuation free fluid in pelvis. Few foci of air within biliary tree question prior ERCP. Electronically Signed   By: Ulyses Southward M.D.   On: 11/25/2021 18:43   DG Chest Port 1 View  Result Date: 11/27/2021 CLINICAL DATA:  Sepsis.  Pancreatitis. EXAM: PORTABLE CHEST 1 VIEW COMPARISON:  10/08/2020 FINDINGS: Heart size is normal. Mediastinal shadows are normal. There is mild atelectasis at the lung bases. No visible effusion. Stent in the left abdomen. IMPRESSION: Mild bibasilar atelectasis. Electronically Signed   By: Paulina Fusi M.D.   On: 11/27/2021 16:46      Labs: BNP (last 3 results) No results for input(s): BNP in the last 8760 hours. Basic Metabolic Panel: Recent Labs  Lab 11/25/21 1223 11/25/21  2309 11/26/21 0545 11/27/21 0459 11/28/21 0537  NA 135  --  135 135 132*  K 3.2*  --  3.4* 3.3* 3.5  CL 96*  --  102 102 101  CO2 30  --  26 27 25   GLUCOSE 148*  --  182* 147* 143*  BUN 6  --  <5* <5* <5*  CREATININE 0.47 0.45 0.48  0.55 0.37*  CALCIUM 9.5  --  8.4* 8.7* 8.6*  MG 1.4*  --  1.7 1.4* 1.7   Liver Function Tests: Recent Labs  Lab 11/25/21 1223 11/26/21 0545  AST 21 18  ALT 27 21  ALKPHOS 67 68  BILITOT 0.8 0.5  PROT 8.5* 7.2  ALBUMIN 3.1* 2.6*   Recent Labs  Lab 11/25/21 1223  LIPASE 25   No results for input(s): AMMONIA in the last 168 hours. CBC: Recent Labs  Lab 11/25/21 1223 11/25/21 2309 11/26/21 0545 11/27/21 0459 11/28/21 0537  WBC 10.4 9.0 10.9* 13.2* 14.4*  NEUTROABS 6.8  --   --   --   --   HGB 10.2* 9.1* 9.5* 8.7* 9.3*  HCT 33.0* 29.5* 30.8* 28.2* 28.9*  MCV 86.4 86.5 84.8 84.9 84.0  PLT 569* 498* 517* 446* 459*   Cardiac Enzymes: No results for input(s): CKTOTAL, CKMB, CKMBINDEX, TROPONINI in the last 168 hours. BNP: Invalid input(s): POCBNP CBG: No results for input(s): GLUCAP in the last 168 hours. D-Dimer No results for input(s): DDIMER in the last 72 hours. Hgb A1c No results for input(s): HGBA1C in the last 72 hours. Lipid Profile No results for input(s): CHOL, HDL, LDLCALC, TRIG, CHOLHDL, LDLDIRECT in the last 72 hours. Thyroid function studies No results for input(s): TSH, T4TOTAL, T3FREE, THYROIDAB in the last 72 hours.  Invalid input(s): FREET3 Anemia work up Recent Labs    11/26/21 1546  VITAMINB12 335  FOLATE 11.1  FERRITIN 113  TIBC 211*  IRON 9*   Urinalysis    Component Value Date/Time   COLORURINE YELLOW 11/25/2021 1544   APPEARANCEUR CLEAR 11/25/2021 1544   APPEARANCEUR Clear 03/24/2014 2008   LABSPEC <1.005 (L) 11/25/2021 1544   LABSPEC 1.004 03/24/2014 2008   PHURINE 7.0 11/25/2021 1544   GLUCOSEU NEGATIVE 11/25/2021 1544   GLUCOSEU Negative 03/24/2014 2008   HGBUR LARGE (A) 11/25/2021 1544   BILIRUBINUR NEGATIVE 11/25/2021 1544   BILIRUBINUR Negative 03/24/2014 2008   KETONESUR 40 (A) 11/25/2021 1544   PROTEINUR 100 (A) 11/25/2021 1544   NITRITE POSITIVE (A) 11/25/2021 1544   LEUKOCYTESUR NEGATIVE 11/25/2021 1544    LEUKOCYTESUR Trace 03/24/2014 2008   Sepsis Labs Invalid input(s): PROCALCITONIN,  WBC,  LACTICIDVEN Microbiology Recent Results (from the past 240 hour(s))  Resp Panel by RT-PCR (Flu A&B, Covid) Nasopharyngeal Swab     Status: None   Collection Time: 11/25/21  3:44 PM   Specimen: Nasopharyngeal Swab; Nasopharyngeal(NP) swabs in vial transport medium  Result Value Ref Range Status   SARS Coronavirus 2 by RT PCR NEGATIVE NEGATIVE Final    Comment: (NOTE) SARS-CoV-2 target nucleic acids are NOT DETECTED.  The SARS-CoV-2 RNA is generally detectable in upper respiratory specimens during the acute phase of infection. The lowest concentration of SARS-CoV-2 viral copies this assay can detect is 138 copies/mL. A negative result does not preclude SARS-Cov-2 infection and should not be used as the sole basis for treatment or other patient management decisions. A negative result may occur with  improper specimen collection/handling, submission of specimen other than nasopharyngeal swab, presence of viral mutation(s) within  the areas targeted by this assay, and inadequate number of viral copies(<138 copies/mL). A negative result must be combined with clinical observations, patient history, and epidemiological information. The expected result is Negative.  Fact Sheet for Patients:  BloggerCourse.com  Fact Sheet for Healthcare Providers:  SeriousBroker.it  This test is no t yet approved or cleared by the [place] FDA and  has been authorized for detection and/or diagnosis of SARS-CoV-2 by FDA under an Emergency Use Authorization (EUA). This EUA will remain  in effect (meaning this test can be used) for the duration of the COVID-19 declaration under Section 564(b)(1) of the Act, 21 U.S.C.section 360bbb-3(b)(1), unless the authorization is terminated  or revoked sooner.       Influenza A by PCR NEGATIVE NEGATIVE Final   Influenza B by  PCR NEGATIVE NEGATIVE Final    Comment: (NOTE) The Xpert Xpress SARS-CoV-2/FLU/RSV plus assay is intended as an aid in the diagnosis of influenza from Nasopharyngeal swab specimens and should not be used as a sole basis for treatment. Nasal washings and aspirates are unacceptable for Xpert Xpress SARS-CoV-2/FLU/RSV testing.  Fact Sheet for Patients: BloggerCourse.com  Fact Sheet for Healthcare Providers: SeriousBroker.it  This test is not yet approved or cleared by the [place] FDA and has been authorized for detection and/or diagnosis of SARS-CoV-2 by FDA under an Emergency Use Authorization (EUA). This EUA will remain in effect (meaning this test can be used) for the duration of the COVID-19 declaration under Section 564(b)(1) of the Act, 21 U.S.C. section 360bbb-3(b)(1), unless the authorization is terminated or revoked.  Performed at [facility], [address] [place], [place] [address]      Charged as level 1 note, since pt was seen but left AMA.    Darlin Priestly, MD  [facility] 11/28/2021, 9:48 AM

## 2021-12-02 LAB — CULTURE, BLOOD (ROUTINE X 2)
Culture: NO GROWTH
Culture: NO GROWTH
Special Requests: ADEQUATE
Special Requests: ADEQUATE

## 2021-12-28 HISTORY — PX: PANCREAS SURGERY: SHX731

## 2022-03-26 ENCOUNTER — Inpatient Hospital Stay
Admission: EM | Admit: 2022-03-26 | Discharge: 2022-03-28 | DRG: 439 | Disposition: A | Discharge status: Home / Self Care | Payer: Medicaid Other | Attending: Internal Medicine | Admitting: Internal Medicine

## 2022-03-26 ENCOUNTER — Emergency Department: Payer: Medicaid Other

## 2022-03-26 ENCOUNTER — Other Ambulatory Visit: Payer: Self-pay

## 2022-03-26 DIAGNOSIS — E876 Hypokalemia: Secondary | ICD-10-CM | POA: Diagnosis present

## 2022-03-26 DIAGNOSIS — G8929 Other chronic pain: Secondary | ICD-10-CM | POA: Diagnosis present

## 2022-03-26 DIAGNOSIS — K567 Ileus, unspecified: Secondary | ICD-10-CM | POA: Diagnosis present

## 2022-03-26 DIAGNOSIS — R109 Unspecified abdominal pain: Secondary | ICD-10-CM | POA: Diagnosis present

## 2022-03-26 DIAGNOSIS — J45909 Unspecified asthma, uncomplicated: Secondary | ICD-10-CM | POA: Diagnosis present

## 2022-03-26 DIAGNOSIS — J4520 Mild intermittent asthma, uncomplicated: Secondary | ICD-10-CM

## 2022-03-26 DIAGNOSIS — Z79899 Other long term (current) drug therapy: Secondary | ICD-10-CM

## 2022-03-26 DIAGNOSIS — M7989 Other specified soft tissue disorders: Secondary | ICD-10-CM | POA: Diagnosis present

## 2022-03-26 DIAGNOSIS — Z818 Family history of other mental and behavioral disorders: Secondary | ICD-10-CM

## 2022-03-26 DIAGNOSIS — Z7722 Contact with and (suspected) exposure to environmental tobacco smoke (acute) (chronic): Secondary | ICD-10-CM | POA: Diagnosis present

## 2022-03-26 DIAGNOSIS — Z880 Allergy status to penicillin: Secondary | ICD-10-CM

## 2022-03-26 DIAGNOSIS — Z8249 Family history of ischemic heart disease and other diseases of the circulatory system: Secondary | ICD-10-CM

## 2022-03-26 DIAGNOSIS — F4310 Post-traumatic stress disorder, unspecified: Secondary | ICD-10-CM | POA: Diagnosis present

## 2022-03-26 DIAGNOSIS — Z791 Long term (current) use of non-steroidal anti-inflammatories (NSAID): Secondary | ICD-10-CM

## 2022-03-26 DIAGNOSIS — R1084 Generalized abdominal pain: Secondary | ICD-10-CM | POA: Diagnosis not present

## 2022-03-26 DIAGNOSIS — F319 Bipolar disorder, unspecified: Secondary | ICD-10-CM | POA: Diagnosis present

## 2022-03-26 DIAGNOSIS — K760 Fatty (change of) liver, not elsewhere classified: Secondary | ICD-10-CM | POA: Diagnosis present

## 2022-03-26 DIAGNOSIS — Z7951 Long term (current) use of inhaled steroids: Secondary | ICD-10-CM

## 2022-03-26 DIAGNOSIS — Y848 Other medical procedures as the cause of abnormal reaction of the patient, or of later complication, without mention of misadventure at the time of the procedure: Secondary | ICD-10-CM | POA: Diagnosis present

## 2022-03-26 DIAGNOSIS — F3130 Bipolar disorder, current episode depressed, mild or moderate severity, unspecified: Secondary | ICD-10-CM | POA: Diagnosis present

## 2022-03-26 DIAGNOSIS — F419 Anxiety disorder, unspecified: Secondary | ICD-10-CM | POA: Diagnosis present

## 2022-03-26 DIAGNOSIS — K9189 Other postprocedural complications and disorders of digestive system: Secondary | ICD-10-CM | POA: Diagnosis present

## 2022-03-26 DIAGNOSIS — K859 Acute pancreatitis without necrosis or infection, unspecified: Secondary | ICD-10-CM | POA: Diagnosis not present

## 2022-03-26 DIAGNOSIS — I10 Essential (primary) hypertension: Secondary | ICD-10-CM | POA: Diagnosis present

## 2022-03-26 HISTORY — DX: Acute pancreatitis without necrosis or infection, unspecified: K85.90

## 2022-03-26 LAB — CBC
HCT: 32.4 % — ABNORMAL LOW (ref 36.0–46.0)
Hemoglobin: 10.3 g/dL — ABNORMAL LOW (ref 12.0–15.0)
MCH: 29.4 pg (ref 26.0–34.0)
MCHC: 31.8 g/dL (ref 30.0–36.0)
MCV: 92.6 fL (ref 80.0–100.0)
Platelets: 336 10*3/uL (ref 150–400)
RBC: 3.5 MIL/uL — ABNORMAL LOW (ref 3.87–5.11)
RDW: 15.2 % (ref 11.5–15.5)
WBC: 6.2 10*3/uL (ref 4.0–10.5)
nRBC: 0 % (ref 0.0–0.2)

## 2022-03-26 LAB — COMPREHENSIVE METABOLIC PANEL
ALT: 32 U/L (ref 0–44)
AST: 28 U/L (ref 15–41)
Albumin: 3.6 g/dL (ref 3.5–5.0)
Alkaline Phosphatase: 72 U/L (ref 38–126)
Anion gap: 8 (ref 5–15)
BUN: 7 mg/dL (ref 6–20)
CO2: 23 mmol/L (ref 22–32)
Calcium: 9 mg/dL (ref 8.9–10.3)
Chloride: 106 mmol/L (ref 98–111)
Creatinine, Ser: 0.57 mg/dL (ref 0.44–1.00)
GFR, Estimated: >60 mL/min (ref >60)
Glucose, Bld: 93 mg/dL (ref 70–99)
Potassium: 3.1 mmol/L — ABNORMAL LOW (ref 3.5–5.1)
Sodium: 137 mmol/L (ref 135–145)
Total Bilirubin: 0.6 mg/dL (ref 0.3–1.2)
Total Protein: 6.9 g/dL (ref 6.5–8.1)

## 2022-03-26 LAB — LIPASE, BLOOD: Lipase: 32 U/L (ref 11–51)

## 2022-03-26 LAB — URINALYSIS, ROUTINE W REFLEX MICROSCOPIC
Bilirubin Urine: NEGATIVE
Glucose, UA: NEGATIVE mg/dL
Ketones, ur: NEGATIVE mg/dL
Leukocytes,Ua: NEGATIVE
Nitrite: NEGATIVE
Protein, ur: NEGATIVE mg/dL
Specific Gravity, Urine: 1.011 (ref 1.005–1.030)
pH: 5 (ref 5.0–8.0)

## 2022-03-26 MED ORDER — IOHEXOL 300 MG/ML  SOLN
100.0000 mL | Freq: Once | INTRAMUSCULAR | Status: AC | PRN
  Administered 2022-03-26: 100 mL via INTRAVENOUS
  Filled 2022-03-26: qty 100

## 2022-03-26 MED ORDER — METOCLOPRAMIDE HCL 5 MG/ML IJ SOLN
10.0000 mg | Freq: Once | INTRAMUSCULAR | Status: AC
  Administered 2022-03-26: 10 mg via INTRAVENOUS
  Filled 2022-03-26: qty 2

## 2022-03-26 MED ORDER — LACTATED RINGERS IV BOLUS
1000.0000 mL | Freq: Once | INTRAVENOUS | Status: AC
  Administered 2022-03-26: 1000 mL via INTRAVENOUS

## 2022-03-26 MED ORDER — ACETAMINOPHEN 650 MG RE SUPP: 650.0000 mg | Freq: Four times a day (QID) | RECTAL | Status: DC | PRN

## 2022-03-26 MED ORDER — ACETAMINOPHEN 325 MG PO TABS
650.0000 mg | ORAL_TABLET | Freq: Four times a day (QID) | ORAL | Status: DC | PRN
  Administered 2022-03-28: 650 mg via ORAL
  Filled 2022-03-26: qty 2

## 2022-03-26 MED ORDER — FENTANYL CITRATE PF 50 MCG/ML IJ SOSY
25.0000 ug | PREFILLED_SYRINGE | INTRAMUSCULAR | Status: AC | PRN
  Administered 2022-03-27: 25 ug via INTRAVENOUS
  Filled 2022-03-26: qty 1

## 2022-03-26 MED ORDER — POTASSIUM CHLORIDE CRYS ER 20 MEQ PO TBCR
40.0000 meq | EXTENDED_RELEASE_TABLET | Freq: Once | ORAL | Status: AC
  Administered 2022-03-26: 40 meq via ORAL
  Filled 2022-03-26: qty 2

## 2022-03-26 MED ORDER — HEPARIN SODIUM (PORCINE) 5000 UNIT/ML IJ SOLN
5000.0000 [IU] | Freq: Three times a day (TID) | INTRAMUSCULAR | Status: DC
  Administered 2022-03-26 – 2022-03-28 (×5): 5000 [IU] via SUBCUTANEOUS
  Filled 2022-03-26 (×5): qty 1

## 2022-03-26 MED ORDER — ONDANSETRON HCL 4 MG PO TABS: 4.0000 mg | ORAL_TABLET | Freq: Four times a day (QID) | ORAL | Status: DC | PRN

## 2022-03-26 MED ORDER — ALBUTEROL SULFATE (2.5 MG/3ML) 0.083% IN NEBU: 3.0000 mL | INHALATION_SOLUTION | Freq: Four times a day (QID) | RESPIRATORY_TRACT | Status: DC | PRN

## 2022-03-26 MED ORDER — SODIUM CHLORIDE 0.9 % IV BOLUS
1000.0000 mL | Freq: Once | INTRAVENOUS | Status: AC
  Administered 2022-03-26: 1000 mL via INTRAVENOUS

## 2022-03-26 MED ORDER — PANTOPRAZOLE SODIUM 40 MG PO TBEC
40.0000 mg | DELAYED_RELEASE_TABLET | Freq: Every day | ORAL | Status: DC
  Administered 2022-03-27 – 2022-03-28 (×2): 40 mg via ORAL
  Filled 2022-03-26 (×2): qty 1

## 2022-03-26 MED ORDER — TRAZODONE HCL 50 MG PO TABS
150.0000 mg | ORAL_TABLET | Freq: Every day | ORAL | Status: DC
  Administered 2022-03-27: 300 mg via ORAL
  Filled 2022-03-26 (×2): qty 6

## 2022-03-26 MED ORDER — ONDANSETRON HCL 4 MG/2ML IJ SOLN: 4.0000 mg | Freq: Four times a day (QID) | INTRAMUSCULAR | Status: DC | PRN

## 2022-03-26 MED ORDER — MORPHINE SULFATE (PF) 4 MG/ML IV SOLN
4.0000 mg | INTRAVENOUS | Status: DC | PRN
  Administered 2022-03-26 – 2022-03-27 (×3): 4 mg via INTRAVENOUS
  Filled 2022-03-26 (×4): qty 1

## 2022-03-26 MED ORDER — ARIPIPRAZOLE 10 MG PO TABS
10.0000 mg | ORAL_TABLET | Freq: Every day | ORAL | Status: DC
  Administered 2022-03-27 – 2022-03-28 (×2): 10 mg via ORAL
  Filled 2022-03-26 (×2): qty 1

## 2022-03-26 MED ORDER — LACTATED RINGERS IV SOLN
INTRAVENOUS | Status: AC
Start: 2022-03-26 — End: 2022-03-27

## 2022-03-26 MED ORDER — TIOTROPIUM BROMIDE MONOHYDRATE 18 MCG IN CAPS: 1.0000 | ORAL_CAPSULE | Freq: Every day | RESPIRATORY_TRACT | Status: DC

## 2022-03-26 MED ORDER — SODIUM CHLORIDE 0.9 % IV SOLN
Freq: Once | INTRAVENOUS | Status: AC
Start: 2022-03-26 — End: 2022-03-26

## 2022-03-26 MED ORDER — ONDANSETRON HCL 4 MG/2ML IJ SOLN
4.0000 mg | Freq: Once | INTRAMUSCULAR | Status: AC
Start: 2022-03-26 — End: 2022-03-26
  Administered 2022-03-26: 4 mg via INTRAVENOUS
  Filled 2022-03-26 (×2): qty 2

## 2022-03-26 MED ORDER — HYDROXYZINE HCL 10 MG PO TABS
10.0000 mg | ORAL_TABLET | Freq: Four times a day (QID) | ORAL | Status: DC | PRN
  Filled 2022-03-26: qty 1

## 2022-03-26 NOTE — Assessment & Plan Note (Signed)
-   Status post potassium chloride 20 mill colons one-time dose ?- Check magnesium ?- BMP in a.m. ?

## 2022-03-26 NOTE — Assessment & Plan Note (Signed)
-   Likely multifactorial in setting of history of acute pancreatitis and SBO versus ileus ?- LR 1 L bolus ?- LR 150 mL/h, 15 hours ordered ?- Pain management: Morphine 4 mg every 3 hours as needed for moderate pain; fentanyl 25 mcg every 2 hours as needed for severe pain, 4 doses ordered ?- Admit to telemetry medical, observation ?

## 2022-03-26 NOTE — Hospital Course (Addendum)
[name] is a 51 year old female with history of ERCP induced acute pancreatitis, cholecystectomy, hepatic steatosis, hypertension, asthma, anxiety, bipolar type I depressed, who presents emergency department for chief concerns of abdominal pain. ? ?Initial vitals in the emergency department showed temperature 98.7, respiration rate 20, heart rate 75, blood pressure 145/92, SPO2 100% on room air. ? ?Serum sodium 137, potassium 3.1, chloride 106, bicarb 23, BUN of 7, serum creatinine of 0.57, GFR greater than 60, nonfasting blood glucose 93, WBC 6.2, hemoglobin 10.3, platelets of 336.  UA was negative for nitrates and leukocytes. ? ?Lipase was 32. ? ?ED treatment: Sodium chloride 1 L bolus, sodium infusion one-time dose, metoclopramide 10 mg IV, ondansetron 4 mg IV, and morphine 4 mg every 3 hours as needed for moderate pain ordered. ?

## 2022-03-26 NOTE — H&P (Signed)
?History and Physical  ? ?Sierra Wiley [id number] DOB: Mar 16, 1971 DOA: 03/26/2022 ? ?PCP: [facility]  ?Patient coming from: home ? ?I have personally briefly reviewed patient's old medical records in [place]. ? ?Chief Concern: Abdominal pain  ? ?HPI: [name] is a 51 year old female with history of ERCP induced acute pancreatitis, cholecystectomy, hepatic steatosis, hypertension, asthma, anxiety, bipolar type I depressed, who presents emergency department for chief concerns of abdominal pain. ? ?Initial vitals in the emergency department showed temperature 98.7, respiration rate 20, heart rate 75, blood pressure 145/92, SPO2 100% on room air. ? ?Serum sodium 137, potassium 3.1, chloride 106, bicarb 23, BUN of 7, serum creatinine of 0.57, GFR greater than 60, nonfasting blood glucose 93, WBC 6.2, hemoglobin 10.3, platelets of 336.  UA was negative for nitrates and leukocytes. ? ?Lipase was 32. ? ?ED treatment: Sodium chloride 1 L bolus, sodium infusion one-time dose, metoclopramide 10 mg IV, ondansetron 4 mg IV, and morphine 4 mg every 3 hours as needed for moderate pain ordered. ? ?At bedside, she is able to tell me her name, age, current year.  ? ?She reports the pain started about a week ago and is similar to her prior episodes of pancreatitis.  She states the pain has been persistent and at her peak has been 8 out of 10.  She reports that with the pain medication it has improved her pain down to a 7 or 6.  She states the pain is diffuse. ? ?She endorses alternate diarrhea and well formed stool.  She denies any constipation, blood in her stool, hematuria, dysuria, chest pain, shortness of breath.  She reports that formally she used to drink occasionally.  She was never a heavy drinker.  Since she has been diagnosed with pancreatitis, she has not had a single drink of alcohol. ? ?Social history: She lives with her boyfriend. She denies ever smoking, etoh, recreational drug  use. She endorses daily exposure to second hand smoking. 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 ?Vaccination history: She is vaccinated for covid 19 and influenza. She just received hepatitis A and B vaccines three weeks ago. ? ?ROS: ?Constitutional: no weight change, no fever ?ENT/Mouth: no sore throat, no rhinorrhea ?Eyes: no eye pain, no vision changes ?Cardiovascular: no chest pain, no dyspnea,  no edema, no palpitations ?Respiratory: no cough, no sputum, no wheezing ?Gastrointestinal: no nausea, no vomiting, + diarrhea, no constipation ?Genitourinary: no urinary incontinence, no dysuria, no hematuria ?Musculoskeletal: no arthralgias, no myalgias ?Skin: no skin lesions, no pruritus, ?Neuro: + weakness, no loss of consciousness, no syncope ?Psych: no anxiety, no depression, + decrease appetite ?Heme/Lymph: no bruising, no bleeding ? ?ED Course: Discussed with emergency medicine provider, patient requiring hospitalization for chief concerns of abdominal pain concerning for acute pancreatitis. ? ?Assessment/Plan ? ?Principal Problem: ?  Abdominal pain ?Active Problems: ?  Chronic pain ?  Asthma ?  Bipolar 1 disorder, depressed ([place]) ?  Post-ERCP acute pancreatitis ?  HTN (hypertension) ?  Hypokalemia ?  Nodule of soft tissue ?  Second hand smoke exposure ?  ?Assessment and Plan: ? ?* Abdominal pain ?- Likely multifactorial in setting of history of acute pancreatitis and SBO versus ileus ?- LR 1 L bolus ?- LR 150 mL/h, 15 hours ordered ?- Pain management: Morphine 4 mg every 3 hours as needed for moderate pain; fentanyl 25 mcg every 2 hours as needed for severe pain, 4 doses ordered ?- Admit to telemetry  medical, observation ? ?Second hand smoke exposure ?- Counseled patient on asking her boyfriend to smoke outside ? ?Nodule of soft tissue ?- Informed patient, she will need outpatient follow-up and evaluation with GI and oncology ?- Patient repeated to me that she understands there are nodules inside  her abdomen and that she will need follow-up with oncology via PCP ? ?Hypokalemia ?- Status post potassium chloride 20 mill colons one-time dose ?- Check magnesium ?- BMP in a.m. ? ?HTN (hypertension) ?- Resumed home prazosin 2 mg nightly ? ?Bipolar 1 disorder, depressed ([place]) ?- Abilify 10 mg a.m. and p.m. ?- Trazodone 150 to 300 mg nightly ? ?Asthma ?- Resume home long-acting inhaler 2 puffs inhalation twice daily, as needed albuterol nebulizer for wheezing and shortness of breath ? ?Chart reviewed.  ? ?DVT prophylaxis: Heparin 5000 units subcutaneous every 8 hours ?Code Status: full code  ?Diet: N.p.o. except for sips with meds ?Family Communication: her family knows she is in the hospital ?Disposition Plan: Pending clinical course ?Consults called: None at this time ?Admission status: Telemetry medical, observation ? ?Past Medical History:  ?Diagnosis Date  ? Asthma   ? Bipolar 1 disorder ([place])   ? Chronic back pain   ? Depression   ? Opiate use 02/21/2016  ? Pancreatitis   ? PTSD (post-traumatic stress disorder)   ? Seizures ([place])   ? ?Past Surgical History:  ?Procedure Laterality Date  ? CHOLECYSTECTOMY    ? ELBOW SURGERY Left 2000  ? TUBAL LIGATION    ? ?Social History:  reports that she has never smoked. She has never used smokeless tobacco. She reports that she does not drink alcohol and does not use drugs. ? ?Allergies  ?Allergen Reactions  ? Penicillins Nausea And Vomiting  ? ?Family History  ?Problem Relation Age of Onset  ? Alcohol abuse Mother   ? Drug abuse Mother   ? HIV Mother   ? Bipolar disorder Mother   ? Cirrhosis Mother   ? Cancer Father   ? Heart disease Father   ? Drug abuse Sister   ? Alcohol abuse Sister   ? ?Family history: Family history reviewed and not pertinent ? ?Prior to Admission medications   ?Medication Sig Start Date End Date Taking? Authorizing Provider  ?albuterol (PROVENTIL HFA;VENTOLIN HFA) 108 (90 Base) MCG/ACT inhaler Inhale 2 puffs into the lungs every 6 (six) hours as  needed for wheezing or shortness of breath.    [provider]  ?amLODipine (NORVASC) 2.5 MG tablet Take 2.5 mg by mouth daily.    [provider]  ?aripiprazole (ABILIFY) 10 MG disintegrating tablet Take 10 mg by mouth in the morning and at bedtime.    [provider]  ?citalopram (CELEXA) 10 MG tablet Take by mouth. ?Patient not taking: Reported on 11/26/2021    [provider]  ?clonazePAM (KLONOPIN) 0.5 MG tablet Take 1 tablet by mouth 2 (two) times daily. As needed ?Patient not taking: Reported on 11/08/2020    [provider]  ?famotidine (PEPCID) 20 MG tablet Take 1 tablet (20 mg total) by mouth 2 (two) times daily. ?Patient not taking: Reported on 11/26/2021 08/27/21   Paulette Blanch, MD  ?gabapentin (NEURONTIN) 300 MG capsule Take 300 mg by mouth 2 (two) times a week.    [provider]  ?hydrOXYzine (ATARAX/VISTARIL) 10 MG tablet Take 10 mg by mouth every 6 (six) hours as needed.    [provider]  ?lactulose (CHRONULAC) 10 GM/15ML solution Take 30 mLs (20  g total) by mouth daily as needed for mild constipation. 08/27/21   Paulette Blanch, MD  ?lidocaine (LIDODERM) 5 % Place 1 patch onto the skin daily. Remove & Discard patch within 12 hours or as directed by MD    [provider]  ?liraglutide (VICTOZA) 18 MG/3ML SOPN Inject 0.6 mg into the skin daily. ?Patient not taking: Reported on 11/26/2021    [provider]  ?metoCLOPramide (REGLAN) 10 MG tablet Take 1 tablet (10 mg total) by mouth every 8 (eight) hours as needed for nausea. 08/27/21   Paulette Blanch, MD  ?omeprazole (PRILOSEC) 10 MG capsule Take 1 capsule (10 mg total) by mouth daily. 03/02/20   Cuthriell, Charline Bills, PA-C  ?oxybutynin (DITROPAN-XL) 10 MG 24 hr tablet Take 10 mg by mouth at bedtime. ?Patient not taking: Reported on 11/08/2020    [provider]  ?prazosin (MINIPRESS) 2 MG capsule Take 2 mg by mouth at bedtime.    [provider]  ?propranolol  (INDERAL) 20 MG tablet Take 10 mg by mouth 2 (two) times daily. ?Patient not taking: Reported on 11/08/2020    [provider]  ?sucralfate (CARAFATE) 1 GM/10ML suspension Take 10 mLs (1 g total)

## 2022-03-26 NOTE — ED Notes (Signed)
Admitting provider at beside

## 2022-03-26 NOTE — ED Provider Notes (Signed)
? ?[facility] ?Provider Note ? ? ? Event Date/Time  ? First MD Initiated Contact with Patient 03/26/22 1459   ?  (approximate) ? ? ?History  ? ?Abdominal Pain ? ? ?HPI ? ?Sierra Wiley is a 51 y.o. female   with a history of post ERCP pancreatitis status post cholecystectomy necrotizing pancreatitis multiple intra-abdominal surgeries related to these complications presents to the ER for evaluation of recurrent epigastric abdominal pain radiating through to her back that she feels is consistent with her pancreatitis.  Denies any alcohol use.  Has been compliant with her medications.  Denies any chest pain or shortness of breath.  No fevers or chills. ? ?  ? ? ?Physical Exam  ? ?Triage Vital Signs: ?ED Triage Vitals  ?Enc Vitals Group  ?   BP 03/26/22 1455 (!) 145/92  ?   Pulse Rate 03/26/22 1455 75  ?   Resp 03/26/22 1455 20  ?   Temp 03/26/22 1455 98.7 ?F (37.1 ?C)  ?   Temp Source 03/26/22 1455 Oral  ?   SpO2 03/26/22 1455 100 %  ?   Weight 03/26/22 1511 192 lb 10.9 oz (87.4 kg)  ?   Height 03/26/22 1511 5\' 4"  (1.626 m)  ?   Head Circumference --   ?   Peak Flow --   ?   Pain Score --   ?   Pain Loc --   ?   Pain Edu? --   ?   Excl. in GC? --   ? ? ?Most recent vital signs: ?Vitals:  ? 03/26/22 1455 03/26/22 1700  ?BP: (!) 145/92 140/88  ?Pulse: 75 76  ?Resp: 20 20  ?Temp: 98.7 ?F (37.1 ?C)   ?SpO2: 100% 100%  ? ? ? ?Constitutional: Alert  ?Eyes: Conjunctivae are normal.  ?Head: Atraumatic. ?Nose: No congestion/rhinnorhea. ?Mouth/Throat: Mucous membranes are moist.   ?Neck: Painless ROM.  ?Cardiovascular:   Good peripheral circulation. No m/g/r ?Respiratory: Normal respiratory effort.  No retractions.  ?Gastrointestinal: Soft with mild epigastric ttp  ?Musculoskeletal:  no deformity ?Neurologic:  MAE spontaneously. No gross focal neurologic deficits are appreciated.  ?Skin:  Skin is warm, dry and intact. No rash noted. ?Psychiatric: Mood and affect are normal. Speech and behavior are  normal. ? ? ? ?ED Results / Procedures / Treatments  ? ?Labs ?(all labs ordered are listed, but only abnormal results are displayed) ?Labs Reviewed  ?COMPREHENSIVE METABOLIC PANEL - Abnormal; Notable for the following components:  ?    Result Value  ? Potassium 3.1 (*)   ? All other components within normal limits  ?CBC - Abnormal; Notable for the following components:  ? RBC 3.50 (*)   ? Hemoglobin 10.3 (*)   ? HCT 32.4 (*)   ? All other components within normal limits  ?URINALYSIS, ROUTINE W REFLEX MICROSCOPIC - Abnormal; Notable for the following components:  ? Color, Urine STRAW (*)   ? APPearance CLEAR (*)   ? Hgb urine dipstick SMALL (*)   ? Bacteria, UA RARE (*)   ? All other components within normal limits  ?LIPASE, BLOOD  ?POC URINE PREG, ED  ? ? ? ?EKG ? ? ? ? ?RADIOLOGY ?Please see ED Course for my review and interpretation. ? ?I personally reviewed all radiographic images ordered to evaluate for the above acute complaints and reviewed radiology reports and findings.  These findings were personally discussed with the patient.  Please see medical record for radiology report. ? ? ? ?  PROCEDURES: ? ?Critical Care performed: No ? ?Procedures ? ? ?MEDICATIONS ORDERED IN ED: ?Medications  ?morphine (PF) 4 MG/ML injection 4 mg (4 mg Intravenous Given 03/26/22 1858)  ?0.9 %  sodium chloride infusion (has no administration in time range)  ?ondansetron [facility]) injection 4 mg (4 mg Intravenous Given 03/26/22 1857)  ?sodium chloride 0.9 % bolus 1,000 mL (0 mLs Intravenous Stopped 03/26/22 2106)  ?iohexol (OMNIPAQUE) 300 MG/ML solution 100 mL (100 mLs Intravenous Contrast Given 03/26/22 1957)  ?metoCLOPramide (REGLAN) injection 10 mg (10 mg Intravenous Given 03/26/22 2103)  ? ? ? ?IMPRESSION / MDM / ASSESSMENT AND PLAN / ED COURSE  ?I reviewed the triage vital signs and the nursing notes. ?             ?               ? ?Differential diagnosis includes, but is not limited to, cryptitis, enteritis, gastritis, SBO,  diverticulitis, perforation, dehydration ? ?Patient presenting with symptoms that she feels are consistent with recurrent pancreatitis.  Bloodwork ordered for the above differential will order IV morphine as well as IV antiemetic and CT imaging to evaluate for the blood complaints ? ? ?Clinical Course as of 03/26/22 2108  ?Tue Mar 26, 2022  ?2006 No white count no lipase elevation LFTs are normal. [PR]  ?2007 CT imaging on my review and interpretation does not show any evidence of obstructive pattern will await formal radiology report [PR]  ?2054 CT imaging with evidence of acute on chronic pancreatitis no pseudocyst.  Probable ileus possible partial small bowel obstruction on CT.  Discussed case in consultation with GI given her extensive past pancreatic history given lack of fever or white count likely appropriate for admission to hospital for IV fluids pain control keep n.p.o.  Unlikely to require any additional GI interventions at this time.  Will consult hospitalist for admission for pain control [PR]  ?2103 Consulted with hospitalist who agrees to admit patient to their service. [PR]  ?  ?Clinical Course User Index ?[PR] Willy Eddy, MD  ? ? ? ?FINAL CLINICAL IMPRESSION(S) / ED DIAGNOSES  ? ?Final diagnoses:  ?Acute pancreatitis, unspecified complication status, unspecified pancreatitis type  ? ? ? ?Rx / DC Orders  ? ?ED Discharge Orders   ? ? None  ? ?  ? ? ? ?Note:  This document was prepared using Dragon voice recognition software and may include unintentional dictation errors. ? ?  ?Willy Eddy, MD ?03/26/22 2108 ? ?

## 2022-03-26 NOTE — ED Notes (Signed)
US guided 22G IV placed to right forearm has no blood return, flushed and IVF started. Unable to collect pending labs at this time. Secure message sent to admitting Dr Mervyn Skeeters. [facility].  ?

## 2022-03-26 NOTE — Assessment & Plan Note (Addendum)
-   Resumed home prazosin 2 mg nightly ?

## 2022-03-26 NOTE — Assessment & Plan Note (Addendum)
-   Informed patient, she will need outpatient follow-up and evaluation with GI and oncology ?- Patient repeated to me that she understands there are nodules inside her abdomen and that she will need follow-up with oncology via PCP ?

## 2022-03-26 NOTE — ED Triage Notes (Signed)
Pt comes into the ED via EMS from home with generalized abd pain , with a hx of pancreatitis, thinks it is what is going on today ? ?150/80 ?HR74 ?CBG179 ?98%RA ?

## 2022-03-26 NOTE — Assessment & Plan Note (Signed)
-   Abilify 10 mg a.m. and p.m. ?- Trazodone 150 to 300 mg nightly ?

## 2022-03-27 ENCOUNTER — Encounter: Payer: Self-pay | Admitting: Internal Medicine

## 2022-03-27 DIAGNOSIS — K760 Fatty (change of) liver, not elsewhere classified: Secondary | ICD-10-CM | POA: Diagnosis present

## 2022-03-27 DIAGNOSIS — F319 Bipolar disorder, unspecified: Secondary | ICD-10-CM | POA: Diagnosis not present

## 2022-03-27 DIAGNOSIS — Z7951 Long term (current) use of inhaled steroids: Secondary | ICD-10-CM | POA: Diagnosis not present

## 2022-03-27 DIAGNOSIS — J45909 Unspecified asthma, uncomplicated: Secondary | ICD-10-CM | POA: Diagnosis present

## 2022-03-27 DIAGNOSIS — I10 Essential (primary) hypertension: Secondary | ICD-10-CM | POA: Diagnosis present

## 2022-03-27 DIAGNOSIS — Z8249 Family history of ischemic heart disease and other diseases of the circulatory system: Secondary | ICD-10-CM | POA: Diagnosis not present

## 2022-03-27 DIAGNOSIS — K859 Acute pancreatitis without necrosis or infection, unspecified: Secondary | ICD-10-CM | POA: Diagnosis present

## 2022-03-27 DIAGNOSIS — Z791 Long term (current) use of non-steroidal anti-inflammatories (NSAID): Secondary | ICD-10-CM | POA: Diagnosis not present

## 2022-03-27 DIAGNOSIS — E876 Hypokalemia: Secondary | ICD-10-CM | POA: Diagnosis present

## 2022-03-27 DIAGNOSIS — K567 Ileus, unspecified: Secondary | ICD-10-CM | POA: Diagnosis present

## 2022-03-27 DIAGNOSIS — Z7722 Contact with and (suspected) exposure to environmental tobacco smoke (acute) (chronic): Secondary | ICD-10-CM

## 2022-03-27 DIAGNOSIS — Y848 Other medical procedures as the cause of abnormal reaction of the patient, or of later complication, without mention of misadventure at the time of the procedure: Secondary | ICD-10-CM | POA: Diagnosis present

## 2022-03-27 DIAGNOSIS — F4310 Post-traumatic stress disorder, unspecified: Secondary | ICD-10-CM | POA: Diagnosis present

## 2022-03-27 DIAGNOSIS — F3130 Bipolar disorder, current episode depressed, mild or moderate severity, unspecified: Secondary | ICD-10-CM | POA: Diagnosis present

## 2022-03-27 DIAGNOSIS — J4520 Mild intermittent asthma, uncomplicated: Secondary | ICD-10-CM | POA: Diagnosis not present

## 2022-03-27 DIAGNOSIS — R109 Unspecified abdominal pain: Secondary | ICD-10-CM | POA: Diagnosis present

## 2022-03-27 DIAGNOSIS — Z880 Allergy status to penicillin: Secondary | ICD-10-CM | POA: Diagnosis not present

## 2022-03-27 DIAGNOSIS — Z818 Family history of other mental and behavioral disorders: Secondary | ICD-10-CM | POA: Diagnosis not present

## 2022-03-27 DIAGNOSIS — R1084 Generalized abdominal pain: Secondary | ICD-10-CM | POA: Diagnosis not present

## 2022-03-27 DIAGNOSIS — G8929 Other chronic pain: Secondary | ICD-10-CM | POA: Diagnosis present

## 2022-03-27 DIAGNOSIS — Z79899 Other long term (current) drug therapy: Secondary | ICD-10-CM | POA: Diagnosis not present

## 2022-03-27 DIAGNOSIS — F419 Anxiety disorder, unspecified: Secondary | ICD-10-CM | POA: Diagnosis present

## 2022-03-27 LAB — BASIC METABOLIC PANEL
Anion gap: 5 (ref 5–15)
BUN: 5 mg/dL — ABNORMAL LOW (ref 6–20)
CO2: 25 mmol/L (ref 22–32)
Calcium: 8.8 mg/dL — ABNORMAL LOW (ref 8.9–10.3)
Chloride: 108 mmol/L (ref 98–111)
Creatinine, Ser: 0.62 mg/dL (ref 0.44–1.00)
GFR, Estimated: >60 mL/min (ref >60)
Glucose, Bld: 119 mg/dL — ABNORMAL HIGH (ref 70–99)
Potassium: 3.7 mmol/L (ref 3.5–5.1)
Sodium: 138 mmol/L (ref 135–145)

## 2022-03-27 LAB — CBC
HCT: 31.5 % — ABNORMAL LOW (ref 36.0–46.0)
Hemoglobin: 10 g/dL — ABNORMAL LOW (ref 12.0–15.0)
MCH: 29.7 pg (ref 26.0–34.0)
MCHC: 31.7 g/dL (ref 30.0–36.0)
MCV: 93.5 fL (ref 80.0–100.0)
Platelets: 311 10*3/uL (ref 150–400)
RBC: 3.37 MIL/uL — ABNORMAL LOW (ref 3.87–5.11)
RDW: 15.2 % (ref 11.5–15.5)
WBC: 4.3 10*3/uL (ref 4.0–10.5)
nRBC: 0 % (ref 0.0–0.2)

## 2022-03-27 LAB — PHOSPHORUS
Phosphorus: 4 mg/dL (ref 2.5–4.6)
Phosphorus: 3.8 mg/dL (ref 2.5–4.6)

## 2022-03-27 LAB — MAGNESIUM
Magnesium: 1.8 mg/dL (ref 1.7–2.4)
Magnesium: 1.8 mg/dL (ref 1.7–2.4)

## 2022-03-27 MED ORDER — VITAMIN B-12 100 MCG PO TABS
100.0000 ug | ORAL_TABLET | Freq: Every day | ORAL | Status: DC
  Administered 2022-03-27 – 2022-03-28 (×2): 100 ug via ORAL
  Filled 2022-03-27 (×2): qty 1

## 2022-03-27 MED ORDER — TIZANIDINE HCL 4 MG PO TABS
4.0000 mg | ORAL_TABLET | Freq: Four times a day (QID) | ORAL | Status: DC | PRN
  Filled 2022-03-27: qty 1

## 2022-03-27 MED ORDER — HYOSCYAMINE SULFATE 0.125 MG PO TBDP
0.1250 mg | ORAL_TABLET | Freq: Three times a day (TID) | ORAL | Status: DC
  Administered 2022-03-27 – 2022-03-28 (×4): 0.125 mg via SUBLINGUAL
  Filled 2022-03-27 (×5): qty 1

## 2022-03-27 MED ORDER — FLUTICASONE PROPIONATE 50 MCG/ACT NA SUSP
1.0000 | Freq: Every day | NASAL | Status: DC | PRN
  Filled 2022-03-27: qty 16

## 2022-03-27 MED ORDER — TIOTROPIUM BROMIDE MONOHYDRATE 18 MCG IN CAPS
18.0000 ug | ORAL_CAPSULE | Freq: Every day | RESPIRATORY_TRACT | Status: DC
  Administered 2022-03-28: 18 ug via RESPIRATORY_TRACT
  Filled 2022-03-27: qty 5

## 2022-03-27 MED ORDER — PREGABALIN 75 MG PO CAPS
75.0000 mg | ORAL_CAPSULE | Freq: Three times a day (TID) | ORAL | Status: DC
Start: 2022-03-27 — End: 2022-03-28
  Administered 2022-03-27 – 2022-03-28 (×4): 75 mg via ORAL
  Filled 2022-03-27 (×3): qty 1
  Filled 2022-03-27: qty 3

## 2022-03-27 MED ORDER — PANCRELIPASE (LIP-PROT-AMYL) 12000-38000 UNITS PO CPEP
60000.0000 [IU] | ORAL_CAPSULE | Freq: Three times a day (TID) | ORAL | Status: DC
  Administered 2022-03-28 (×2): 60000 [IU] via ORAL
  Filled 2022-03-27 (×5): qty 5

## 2022-03-27 MED ORDER — PRAZOSIN HCL 1 MG PO CAPS
2.0000 mg | ORAL_CAPSULE | Freq: Every day | ORAL | Status: DC
  Administered 2022-03-27: 2 mg via ORAL
  Filled 2022-03-27: qty 2

## 2022-03-27 MED ORDER — MOMETASONE FURO-FORMOTEROL FUM 200-5 MCG/ACT IN AERO
2.0000 | INHALATION_SPRAY | Freq: Two times a day (BID) | RESPIRATORY_TRACT | Status: DC
  Administered 2022-03-28: 2 via RESPIRATORY_TRACT
  Filled 2022-03-27: qty 8.8

## 2022-03-27 MED ORDER — OLOPATADINE HCL 0.1 % OP SOLN: 1.0000 [drp] | Freq: Two times a day (BID) | OPHTHALMIC | Status: DC | PRN

## 2022-03-27 NOTE — Consult Note (Signed)
[facility] ?SURGICAL CONSULTATION NOTE (initial) - cpt: 23300 ? ? ?HISTORY OF PRESENT ILLNESS (HPI):  ?51 y.o. female presented to [facility] ED overnight for evaluation of abdominal pain. Patient has a rather complex surgical history including recent prolonged hospitalization at [facility] for post-ERCP necrotizing pancreatitis requiring cyst gastrotomy (11/26) and necrosectomy x2 (11/3 and 11/9). She reports the acute onset of epigastric abdominal pain which is radiating through to her back for about 4 days prior to arrival. She has struggled with similar pain since her admission at [facility]. This is similar to that. She does endorse to me that she has been tolerating PO but does have epigastric abdominal pain. Also with chronic diarrhea, which is unchanged, since surgery. She is taking pancreatic enzymes without improvement. No fever, chills, nausea, emesis. She denied any recent alcohol or recreational drug abuse. Work up in the ED was fairly reassuring aside from mild hypokalemia to 3.1, and her lipase was normal at 32. She did under CT Abdomen/pelvis concerning for likely acute on chronic pancreatitis but was also found to have a short segment of dilated small bowel in left abdomen concerning for possible SBO vs Ileus. She was ultimately admitted to the medicine service. She is currently NPO.  ? ?Surgery is consulted by hospitalist physician Dr. Gunnar Bulla, MD in this context for evaluation and management of possible SBO vs ileus. ? ?PAST MEDICAL HISTORY (PMH):  ?Past Medical History:  ?Diagnosis Date  ? Asthma   ? Bipolar 1 disorder ([facility])   ? Chronic back pain   ? Depression   ? Opiate use 02/21/2016  ? Pancreatitis   ? PTSD (post-traumatic stress disorder)   ? Seizures ([facility])   ?  ? ?PAST SURGICAL HISTORY (PSH):  ?Past Surgical History:  ?Procedure Laterality Date  ? CHOLECYSTECTOMY    ? ELBOW SURGERY Left 2000  ? TUBAL LIGATION    ?  ? ?MEDICATIONS:  ?Prior to Admission medications   ?Medication Sig Start  Date End Date Taking? Authorizing Provider  ?aripiprazole (ABILIFY) 10 MG disintegrating tablet Take 10 mg by mouth daily.   Yes [provider]  ?budesonide-formoterol (SYMBICORT) 160-4.5 MCG/ACT inhaler Inhale 2 puffs into the lungs 2 (two) times daily.   Yes [provider]  ?fluticasone (FLONASE) 50 MCG/ACT nasal spray Place 1 spray into both nostrils daily.   Yes [provider]  ?hydrOXYzine (ATARAX/VISTARIL) 10 MG tablet Take 10 mg by mouth every 6 (six) hours as needed.   Yes [provider]  ?hyoscyamine (ANASPAZ) 0.125 MG TBDP disintergrating tablet Place 0.125 mg under the tongue 3 (three) times daily.   Yes [provider]  ?lipase/protease/amylase (CREON) 12000-38000 units CPEP capsule Take 60,000 Units by mouth 3 (three) times daily before meals.   Yes [provider]  ?naproxen (NAPROSYN) 500 MG tablet Take 500 mg by mouth 2 (two) times daily with a meal.   Yes [provider]  ?olopatadine (PATANOL) 0.1 % ophthalmic solution 1 drop 2 (two) times daily.   Yes [provider]  ?prazosin (MINIPRESS) 2 MG capsule Take 2 mg by mouth at bedtime.   Yes [provider]  ?pregabalin (LYRICA) 75 MG capsule Take 75 mg by mouth 3 (three) times daily.   Yes [provider]  ?tiZANidine (ZANAFLEX) 4 MG tablet Take 4 mg by mouth every 6 (six) hours as needed for muscle spasms.   Yes [provider]  ?vitamin B-12 (CYANOCOBALAMIN) 100 MCG tablet Take 100 mcg by mouth daily.  Yes [provider]  ?albuterol (PROVENTIL HFA;VENTOLIN HFA) 108 (90 Base) MCG/ACT inhaler Inhale 2 puffs into the lungs every 6 (six) hours as needed for wheezing or shortness of breath.    [provider]  ?amLODipine (NORVASC) 2.5 MG tablet Take 2.5 mg by mouth daily. ?Patient not taking: Reported on 03/26/2022    [provider]  ?citalopram (CELEXA) 10 MG tablet Take by mouth. ?Patient not taking: Reported on  11/26/2021    [provider]  ?clonazePAM (KLONOPIN) 0.5 MG tablet Take 1 tablet by mouth 2 (two) times daily. As needed ?Patient not taking: Reported on 11/08/2020    [provider]  ?famotidine (PEPCID) 20 MG tablet Take 1 tablet (20 mg total) by mouth 2 (two) times daily. ?Patient not taking: Reported on 11/26/2021 08/27/21   Irean HongSung, Jade J, MD  ?gabapentin (NEURONTIN) 300 MG capsule Take 300 mg by mouth 2 (two) times a week. ?Patient not taking: Reported on 03/26/2022    [provider]  ?lactulose (CHRONULAC) 10 GM/15ML solution Take 30 mLs (20 g total) by mouth daily as needed for mild constipation. ?Patient not taking: Reported on 03/26/2022 08/27/21   Irean HongSung, Jade J, MD  ?lidocaine (LIDODERM) 5 % Place 1 patch onto the skin daily. Remove & Discard patch within 12 hours or as directed by MD ?Patient not taking: Reported on 03/26/2022    [provider]  ?liraglutide (VICTOZA) 18 MG/3ML SOPN Inject 0.6 mg into the skin daily. ?Patient not taking: Reported on 11/26/2021    [provider]  ?metFORMIN (GLUCOPHAGE) 500 MG tablet Take 500 mg by mouth daily. 01/21/22   [provider]  ?metoCLOPramide (REGLAN) 10 MG tablet Take 1 tablet (10 mg total) by mouth every 8 (eight) hours as needed for nausea. ?Patient not taking: Reported on 03/26/2022 08/27/21   Irean HongSung, Jade J, MD  ?omeprazole (PRILOSEC) 10 MG capsule Take 1 capsule (10 mg total) by mouth daily. ?Patient not taking: Reported on 03/26/2022 03/02/20   Cuthriell, Delorise RoyalsJonathan D, PA-C  ?oxybutynin (DITROPAN-XL) 10 MG 24 hr tablet Take 10 mg by mouth at bedtime. ?Patient not taking: Reported on 11/08/2020    [provider]  ?propranolol (INDERAL) 20 MG tablet Take 10 mg by mouth 2 (two) times daily. ?Patient not taking: Reported on 11/08/2020    [provider]  ?sucralfate (CARAFATE) 1 GM/10ML suspension Take 10 mLs (1 g total) by mouth 4 (four) times daily. ?Patient not taking: Reported on 03/26/2022  08/27/21   Irean HongSung, Jade J, MD  ?sulfamethoxazole-trimethoprim (BACTRIM DS) 800-160 MG tablet Take 1 tablet by mouth 2 (two) times daily. ?Patient not taking: Reported on 11/26/2021 07/11/21   Cuthriell, Delorise RoyalsJonathan D, PA-C  ?Tiotropium Bromide Monohydrate (SPIRIVA RESPIMAT) 1.25 MCG/ACT AERS Inhale 2 puffs into the lungs daily for 1 day. 11/08/20 11/09/20  Salena SanerGonzalez, Carmen L, MD  ?traZODone (DESYREL) 100 MG tablet Take 150-300 mg by mouth at bedtime. ?Patient not taking: Reported on 03/26/2022    [provider]  ?  ? ?ALLERGIES:  ?Allergies  ?Allergen Reactions  ? Penicillins Nausea And Vomiting  ?  ? ?SOCIAL HISTORY:  ?Social History  ? ?Socioeconomic History  ? Marital status: Divorced  ?  Spouse name: Not on file  ? Number of children: Not on file  ? Years of education: Not on file  ? Highest education level: Not on file  ?Occupational History  ? Not on file  ?Tobacco Use  ? Smoking status: Never  ? Smokeless tobacco: Never  ?  Vaping Use  ? Vaping Use: Never used  ?Substance and Sexual Activity  ? Alcohol use: No  ?  Alcohol/week: 0.0 standard drinks  ? Drug use: No  ? Sexual activity: Yes  ?  Birth control/protection: Condom  ?Other Topics Concern  ? Not on file  ?Social History Narrative  ? Not on file  ? ?Social Determinants of Health  ? ?Financial Resource Strain: Not on file  ?Food Insecurity: Not on file  ?Transportation Needs: Not on file  ?Physical Activity: Not on file  ?Stress: Not on file  ?Social Connections: Not on file  ?Intimate Partner Violence: Not on file  ?  ? ?FAMILY HISTORY:  ?Family History  ?Problem Relation Age of Onset  ? Alcohol abuse Mother   ? Drug abuse Mother   ? HIV Mother   ? Bipolar disorder Mother   ? Cirrhosis Mother   ? Cancer Father   ? Heart disease Father   ? Drug abuse Sister   ? Alcohol abuse Sister   ?  ? ? ?REVIEW OF SYSTEMS:  ?Review of Systems  ?Constitutional:  Negative for chills and fever.  ?HENT:  Negative for congestion and sore throat.   ?Respiratory:  Negative  for cough and shortness of breath.   ?Cardiovascular:  Negative for chest pain and palpitations.  ?Gastrointestinal:  Positive for abdominal pain and diarrhea (Chronic). Negative for constipation, nausea and vomit

## 2022-03-27 NOTE — Progress Notes (Signed)
Pt admitted to unit shows no s/s of distress breathing unlabored admission complete will continue to monitor  ?

## 2022-03-27 NOTE — Progress Notes (Signed)
?PROGRESS NOTE ? ? ? ?Sierra Wiley  [id number] DOB: Feb 12, 1971 DOA: 03/26/2022 ?PCP: [facility], [employer]  ? ? ?Brief Narrative:  ?[name] is a 51 year old female with history of ERCP induced acute pancreatitis, cholecystectomy, hepatic steatosis, hypertension, asthma, anxiety, bipolar type I depressed, who presents emergency department for chief concerns of abdominal pain. ? ?Found with acute pancreatitis. ? ?Consultants:  ?surgery ? ?Procedures:  ?CT a/p ?1. Mild to moderate severity acute pancreatitis with evidence of ?prior post transgastric cyst gastrostomy drainage and stent ?placement. ?2. Additional findings consistent with a partial small bowel ?obstruction versus ileus. ?3. Small amount of ascites. ?4. Hepatic steatosis with evidence of prior cholecystectomy and mild ?pneumobilia. ?5. Small right pleural effusion. ?6. Stable peripheral mesenteric soft tissue nodules within the mid ?and lower right abdomen. Sequelae associated with an underlying ?neoplastic process and/or metastatic disease cannot be excluded. ? ?Antimicrobials:  ?  ? ? ?Subjective: ?Requesting clear diet now. No n/v. Mild epigastric Abd pain , but less than yesterday ? ?Objective: ?Vitals:  ? 03/27/22 1000 03/27/22 1300 03/27/22 1432 03/27/22 1616  ?BP: (!) 145/76 124/84 139/80 (!) 150/82  ?Pulse: (!) 56 66 (!) 55 65  ?Resp: 15 14 18 18   ?Temp:    97.6 ?F (36.4 ?C)  ?TempSrc:    Oral  ?SpO2: 97% 97% 100% 100%  ?Weight:      ?Height:      ? ?No intake or output data in the 24 hours ending 03/27/22 1626 ?Filed Weights  ? 03/26/22 1511  ?Weight: 87.4 kg  ? ? ?Examination: ?Calm, NAD ?Cta no w/r ?Reg s1/s2 no gallop ?Soft mild ttp epigastric area ?No edema ?Aaoxox3  ?Mood and affect appropriate in current setting  ? ? ? ? ?Data Reviewed: I have personally reviewed following labs and imaging studies ? ?CBC: ?Recent Labs  ?Lab 03/26/22 ?1830 03/27/22 ?0518  ?WBC 6.2 4.3  ?HGB 10.3* 10.0*  ?HCT 32.4* 31.5*  ?MCV 92.6  93.5  ?PLT 336 311  ? ?Basic Metabolic Panel: ?Recent Labs  ?Lab 03/26/22 ?1830 03/27/22 ?0518  ?NA 137 138  ?K 3.1* 3.7  ?CL 106 108  ?CO2 23 25  ?GLUCOSE 93 119*  ?BUN 7 5*  ?CREATININE 0.57 0.62  ?CALCIUM 9.0 8.8*  ?MG 1.8 1.8  ?PHOS 4.0 3.8  ? ?GFR: ?Estimated Creatinine Clearance: 92 mL/min (by C-G formula based on SCr of 0.62 mg/dL). ?Liver Function Tests: ?Recent Labs  ?Lab 03/26/22 ?1830  ?AST 28  ?ALT 32  ?ALKPHOS 72  ?BILITOT 0.6  ?PROT 6.9  ?ALBUMIN 3.6  ? ?Recent Labs  ?Lab 03/26/22 ?1830  ?LIPASE 32  ? ?No results for input(s): AMMONIA in the last 168 hours. ?Coagulation Profile: ?No results for input(s): INR, PROTIME in the last 168 hours. ?Cardiac Enzymes: ?No results for input(s): CKTOTAL, CKMB, CKMBINDEX, TROPONINI in the last 168 hours. ?BNP (last 3 results) ?No results for input(s): PROBNP in the last 8760 hours. ?HbA1C: ?No results for input(s): HGBA1C in the last 72 hours. ?CBG: ?No results for input(s): GLUCAP in the last 168 hours. ?Lipid Profile: ?No results for input(s): CHOL, HDL, LDLCALC, TRIG, CHOLHDL, LDLDIRECT in the last 72 hours. ?Thyroid Function Tests: ?No results for input(s): TSH, T4TOTAL, FREET4, T3FREE, THYROIDAB in the last 72 hours. ?Anemia Panel: ?No results for input(s): VITAMINB12, FOLATE, FERRITIN, TIBC, IRON, RETICCTPCT in the last 72 hours. ?Sepsis Labs: ?No results for input(s): PROCALCITON, LATICACIDVEN in the last 168 hours. ? ?No results found for this or any previous visit (  from the past 240 hour(s)).  ? ? ? ? ? ?Radiology Studies: ?CT ABDOMEN PELVIS W CONTRAST ? ?Addendum Date: 03/26/2022   ?ADDENDUM REPORT: 03/26/2022 20:34 Electronically Signed   By: Virgina Norfolk M.D.   On: 03/26/2022 20:34  ? ?Result Date: 03/26/2022 ?CLINICAL DATA:  Generalized abdominal pain. EXAM: CT ABDOMEN AND PELVIS WITH CONTRAST TECHNIQUE: Multidetector CT imaging of the abdomen and pelvis was performed using the standard protocol following bolus administration of intravenous contrast.  RADIATION DOSE REDUCTION: This exam was performed according to the departmental dose-optimization program which includes automated exposure control, adjustment of the mA and/or kV according to patient size and/or use of iterative reconstruction technique. CONTRAST:  124mL OMNIPAQUE IOHEXOL 300 MG/ML  SOLN COMPARISON:  November 25, 2021 FINDINGS: Lower chest: Mild atelectasis is seen within the bilateral lung bases, with a small right pleural effusion. Hepatobiliary: Extensive diffuse fatty infiltration of the liver parenchyma is seen. A small amount of pneumobilia is noted within the left lobe. No focal liver abnormality is seen. Status post cholecystectomy. The common bile duct measures 7.6 mm in diameter. Pancreas: There is evidence of prior post transgastric cyst gastrostomy drainage of pancreatic fluid collections with two stents in place. These are stable in position. Mild peripancreatic inflammatory fat stranding is seen Spleen: Normal in size without focal abnormality. Adrenals/Urinary Tract: Adrenal glands are unremarkable. Kidneys are normal, without renal calculi, focal lesion, or hydronephrosis. Bladder is unremarkable. Stomach/Bowel: Stomach is within normal limits. The appendix is not clearly identified. Dilated loops of air and fluid-filled small bowel are seen within the posterior aspect of the mid and lower left abdomen (maximum small bowel diameter of approximately 3.0 cm). A gradual transition zone is suspected within the lateral aspect of the mid left abdomen (axial CT images 29 through 34, CT series 2). Vascular/Lymphatic: No significant vascular findings are present. Numerous tiny mesenteric lymph nodes are seen scattered throughout the abdomen. Reproductive: An IUD is in place. The uterus and bilateral adnexa are otherwise unremarkable. Other: No abdominal wall hernia or abnormality. A small amount of perihepatic fluid is seen along the inferior tip of the right lobe of the liver. A small  amount of posterior pelvic free fluid is also seen. Stable peripheral mesenteric soft tissue nodules are seen within the mid and lower right abdomen. Musculoskeletal: No acute or significant osseous findings. IMPRESSION: 1. Mild to moderate severity acute pancreatitis with evidence of prior post transgastric cyst gastrostomy drainage and stent placement. 2. Additional findings consistent with a partial small bowel obstruction versus ileus. 3. Small amount of ascites. 4. Hepatic steatosis with evidence of prior cholecystectomy and mild pneumobilia. 5. Small right pleural effusion. 6. Stable peripheral mesenteric soft tissue nodules within the mid and lower right abdomen. Sequelae associated with an underlying neoplastic process and/or metastatic disease cannot be excluded. Electronically Signed: By: Virgina Norfolk M.D. On: 03/26/2022 20:27   ? ? ? ? ? ?Scheduled Meds: ? ARIPiprazole  10 mg Oral Daily  ? heparin  5,000 Units Subcutaneous Q8H  ? hyoscyamine  0.125 mg Sublingual TID  ? lipase/protease/amylase  60,000 Units Oral TID AC  ? [START ON 03/28/2022] mometasone-formoterol  2 puff Inhalation BID  ? pantoprazole  40 mg Oral Daily  ? prazosin  2 mg Oral QHS  ? pregabalin  75 mg Oral TID  ? [START ON 03/28/2022] tiotropium  18 mcg Inhalation Daily  ? traZODone  150-300 mg Oral QHS  ? vitamin B-12  100 mcg Oral Daily  ? ?Continuous  Infusions: ? ?Assessment & Plan: ?  ?Principal Problem: ?  Abdominal pain ?Active Problems: ?  Chronic pain ?  Asthma ?  Bipolar 1 disorder, depressed ([place]) ?  Post-ERCP acute pancreatitis ?  HTN (hypertension) ?  Hypokalemia ?  Nodule of soft tissue ?  Second hand smoke exposure ? ? ?Acute pancreatitis ?Found on CT ?Continue ivf ?Asking for clears, will start ?Pain mx. ?Ct with ileus v.s. sbo- gsx consulted, input was appreciated.  They did not feel she has an obstructive process.  Findings could represent bowel dilatation irritated from intra-abdominal inflammatory process related to  chronic pancreatitis.  No surgical intervention needed. ?General surgery signed off ? ?Nodule of soft tissue ?Patient was informed to follow-up with GI, PCP and oncology as outpatient ?Found on CT  ? ? ? ?  ?

## 2022-03-27 NOTE — ED Notes (Signed)
Informed RN bed assigned 

## 2022-03-27 NOTE — Assessment & Plan Note (Signed)
-   Resume home long-acting inhaler 2 puffs inhalation twice daily, as needed albuterol nebulizer for wheezing and shortness of breath ?

## 2022-03-27 NOTE — Progress Notes (Signed)
Admission profile updated. ?

## 2022-03-27 NOTE — Assessment & Plan Note (Signed)
-   Counseled patient on asking her boyfriend to smoke outside ?

## 2022-03-28 DIAGNOSIS — G8929 Other chronic pain: Secondary | ICD-10-CM

## 2022-03-28 MED ORDER — LACTATED RINGERS IV SOLN: INTRAVENOUS | Status: DC

## 2022-03-28 NOTE — Discharge Summary (Signed)
Sierra Wiley QIW:[id number] DOB: 1971-02-13 DOA: 03/26/2022 ? ?PCP: [facility] [employer] ? ?Admit date: 03/26/2022 ?Discharge date: 03/29/2022 ? ?Admitted From: Home ?Disposition: Home ? ?Recommendations for Outpatient Follow-up:  ?Follow up with PCP in 1 week ?Please obtain BMP/CBC in one week ?Please follow up with primary GI in 1 week ? ? ? ? ?Discharge Condition:Stable ?CODE STATUS: Full ?Diet recommendation: Low-fat  ? ? ?Brief/Interim Summary: ?Per HPI:Sierra Wiley is a 51 year old female with history of ERCP induced acute pancreatitis, cholecystectomy, hepatic steatosis, hypertension, asthma, anxiety, bipolar type I depressed, who presents emergency department for chief concerns of abdominal pain.  CT abdomen pelvis obtained revealing Mild to moderate severity acute pancreatitis .  Initially was n.p.o. to and diet advance as tolerated.  Received IV fluids for hydration.  When she tolerated feeding clinically improved, she is stable for discharge. ? ?CT a/p ?1. Mild to moderate severity acute pancreatitis with evidence of ?prior post transgastric cyst gastrostomy drainage and stent ?placement. ?2. Additional findings consistent with a partial small bowel ?obstruction versus ileus. ?3. Small amount of ascites. ?4. Hepatic steatosis with evidence of prior cholecystectomy and mild ?pneumobilia. ?5. Small right pleural effusion. ?6. Stable peripheral mesenteric soft tissue nodules within the mid ?and lower right abdomen. Sequelae associated with an underlying ?neoplastic process and/or metastatic disease cannot be excluded ? ? ?Acute pancreatitis ?Found on CT ?Was resuscitated with IV fluids ?Ct with ileus v.s. sbo- gsx consulted, input was appreciated.  They did not feel she has an obstructive process.  Findings could represent bowel dilatation irritated from intra-abdominal inflammatory process related to chronic pancreatitis.  No surgical intervention needed. ?General surgery signed off ?Tolerated  feeding ? ? ?  ?Nodule of soft tissue ?Patient was informed to follow-up with GI, PCP and oncology as outpatient ?Found on CT -please see findings above ?Patient verbalized an understanding about importance follow-up ?  ?  ?  ?  ?Second hand smoke exposure ?Counseled patient on avoiding secondhand smoke ?  ? ?  ?Hypokalemia ?Supplemented stable. ?  ?HTN (hypertension) ?Stable continue home meds ?  ?Bipolar 1 disorder, depressed ([facility]) ?Continue home meds ?  ?Asthma ?Not in acute exacerbation ? ? ? ? ?Discharge Diagnoses:  ?Principal Problem: ?  Abdominal pain ?Active Problems: ?  Chronic pain ?  Asthma ?  Bipolar 1 disorder, depressed ([facility]) ?  Post-ERCP acute pancreatitis ?  HTN (hypertension) ?  Hypokalemia ?  Nodule of soft tissue ?  Second hand smoke exposure ? ? ? ?Discharge Instructions ? ?Discharge Instructions   ? ? Call MD for:  severe uncontrolled pain   Complete by: As directed ?  ? Diet - low sodium heart healthy   Complete by: As directed ?  ? Discharge instructions   Complete by: As directed ?  ? Start soft diet (mushy consistency) tomorrow. Do this for couple of days before advancing diet to regular consistency. Stay on full liquid diet today ?Hydrate with water, water, water.  ?No alcohol, no fatty food ?Need to f/u with pcp and Gastroenterologist for the CT scan findings.  ? Increase activity slowly   Complete by: As directed ?  ? ?  ? ?Allergies as of 03/28/2022   ? ?   Reactions  ? Penicillins Nausea And Vomiting  ? ?  ? ?  ?Medication List  ?  ? ?STOP taking these medications   ? ?naproxen 500 MG tablet ?Commonly known as: NAPROSYN ?  ? ?  ? ?TAKE these medications   ? ?  albuterol 108 (90 Base) MCG/ACT inhaler ?Commonly known as: VENTOLIN HFA ?Inhale 2 puffs into the lungs every 6 (six) hours as needed for wheezing or shortness of breath. ?  ?aripiprazole 10 MG disintegrating tablet ?Commonly known as: ABILIFY ?Take 10 mg by mouth daily. ?  ?budesonide-formoterol 160-4.5 MCG/ACT inhaler ?Commonly  known as: SYMBICORT ?Inhale 2 puffs into the lungs 2 (two) times daily. ?  ?citalopram 10 MG tablet ?Commonly known as: CELEXA ?Take by mouth. ?  ?fluticasone 50 MCG/ACT nasal spray ?Commonly known as: FLONASE ?Place 1 spray into both nostrils daily. ?  ?hydrOXYzine 10 MG tablet ?Commonly known as: ATARAX ?Take 10 mg by mouth every 6 (six) hours as needed. ?  ?hyoscyamine 0.125 MG Tbdp disintergrating tablet ?Commonly known as: ANASPAZ ?Place 0.125 mg under the tongue 3 (three) times daily. ?  ?lipase/protease/amylase 12000-38000 units Cpep capsule ?Commonly known as: CREON ?Take 60,000 Units by mouth 3 (three) times daily before meals. ?  ?metFORMIN 500 MG tablet ?Commonly known as: GLUCOPHAGE ?Take 500 mg by mouth daily. ?  ?olopatadine 0.1 % ophthalmic solution ?Commonly known as: PATANOL ?1 drop 2 (two) times daily. ?  ?omeprazole 10 MG capsule ?Commonly known as: PRILOSEC ?Take 1 capsule (10 mg total) by mouth daily. ?  ?prazosin 2 MG capsule ?Commonly known as: MINIPRESS ?Take 2 mg by mouth at bedtime. ?  ?pregabalin 75 MG capsule ?Commonly known as: LYRICA ?Take 75 mg by mouth 3 (three) times daily. ?  ?Spiriva Respimat 1.25 MCG/ACT Aers ?Generic drug: Tiotropium Bromide Monohydrate ?Inhale 2 puffs into the lungs daily for 1 day. ?  ?tiZANidine 4 MG tablet ?Commonly known as: ZANAFLEX ?Take 4 mg by mouth every 6 (six) hours as needed for muscle spasms. ?  ?traZODone 100 MG tablet ?Commonly known as: DESYREL ?Take 150-300 mg by mouth at bedtime. ?  ?vitamin B-12 100 MCG tablet ?Commonly known as: CYANOCOBALAMIN ?Take 100 mcg by mouth daily. ?  ? ?  ? ? Follow-up Information   ? ? [facility]   ?Why: patient is making own follow up appt ?Contact information: ?[address] ?[place] [address] ?[phone] ? ? ?  ?  ? ? Kela MillinGilman, Andrew James, MD Follow up in 1 week(s).   ?Specialty: Gastroenterology ?Why: patient is making own follow up appt ?Contact information: ?[address] ?[place] [address] ?[phone] ? ? ?  ?  ? ?  ?  ? ?  ? ?Allergies  ?Allergen Reactions  ? Penicillins Nausea And Vomiting  ? ? ?Consultations: ?Surgery ? ? ?Procedures/Studies: ?CT ABDOMEN PELVIS W CONTRAST ? ?Addendum Date: 03/26/2022   ?ADDENDUM REPORT: 03/26/2022 20:34 Electronically Signed   By: Aram Candelahaddeus  Houston M.D.   On: 03/26/2022 20:34  ? ?Result Date: 03/26/2022 ?CLINICAL DATA:  Generalized abdominal pain. EXAM: CT ABDOMEN AND PELVIS WITH CONTRAST TECHNIQUE: Multidetector CT imaging of the abdomen and pelvis was performed using the standard protocol following bolus administration of intravenous contrast. RADIATION DOSE REDUCTION: This exam was performed according to the departmental dose-optimization program which includes automated exposure control, adjustment of the mA and/or kV according to patient size and/or use of iterative reconstruction technique. CONTRAST:  100mL OMNIPAQUE IOHEXOL 300 MG/ML  SOLN COMPARISON:  November 25, 2021 FINDINGS: Lower chest: Mild atelectasis is seen within the bilateral lung bases, with a small right pleural effusion. Hepatobiliary: Extensive diffuse fatty infiltration of the liver parenchyma is seen. A small amount of pneumobilia is noted within the  left lobe. No focal liver abnormality is seen. Status post cholecystectomy. The common bile duct measures 7.6 mm in diameter. Pancreas: There is evidence of prior post transgastric cyst gastrostomy drainage of pancreatic fluid collections with two stents in place. These are stable in position. Mild peripancreatic inflammatory fat stranding is seen Spleen: Normal in size without focal abnormality. Adrenals/Urinary Tract: Adrenal glands are unremarkable. Kidneys are normal, without renal calculi, focal lesion, or hydronephrosis. Bladder is unremarkable. Stomach/Bowel: Stomach is within normal limits. The appendix is not clearly identified. Dilated loops of air and fluid-filled small bowel are seen within the  posterior aspect of the mid and lower left abdomen (maximum small bowel diameter of approximately 3.0 cm). A gradual transition zone is suspected within the lateral aspect of the mid left abdomen (axial C

## 2022-03-28 NOTE — Care Management (Signed)
?  Transition of Care (TOC) Screening Note ? ? ?Patient Details  ?Name: Sierra Wiley ?Date of Birth: [birth year] ? ? ?Transition of Care (TOC) CM/SW Contact:    ?Caryn Section, RN ?Phone Number: ?03/28/2022, 1:09 PM ? ? ? ?Transition of Care Department [facility]) has reviewed patient and no TOC needs have been identified at this time. We will continue to monitor patient advancement through interdisciplinary progression rounds. If new patient transition needs arise, please place a TOC consult. ?  ?

## 2022-04-07 ENCOUNTER — Emergency Department: Payer: Medicaid Other

## 2022-04-07 ENCOUNTER — Other Ambulatory Visit: Payer: Self-pay

## 2022-04-07 ENCOUNTER — Emergency Department
Admission: EM | Admit: 2022-04-07 | Discharge: 2022-04-07 | Disposition: A | Discharge status: Home / Self Care | Payer: Medicaid Other | Attending: Emergency Medicine | Admitting: Emergency Medicine

## 2022-04-07 ENCOUNTER — Encounter: Payer: Self-pay | Admitting: Radiology

## 2022-04-07 DIAGNOSIS — J45909 Unspecified asthma, uncomplicated: Secondary | ICD-10-CM | POA: Diagnosis not present

## 2022-04-07 DIAGNOSIS — R197 Diarrhea, unspecified: Secondary | ICD-10-CM | POA: Diagnosis not present

## 2022-04-07 DIAGNOSIS — R739 Hyperglycemia, unspecified: Secondary | ICD-10-CM | POA: Diagnosis not present

## 2022-04-07 DIAGNOSIS — R1013 Epigastric pain: Secondary | ICD-10-CM

## 2022-04-07 DIAGNOSIS — I10 Essential (primary) hypertension: Secondary | ICD-10-CM

## 2022-04-07 LAB — URINALYSIS, COMPLETE (UACMP) WITH MICROSCOPIC
Bilirubin Urine: NEGATIVE
Glucose, UA: NEGATIVE mg/dL
Hgb urine dipstick: NEGATIVE
Ketones, ur: NEGATIVE mg/dL
Leukocytes,Ua: NEGATIVE
Nitrite: NEGATIVE
Protein, ur: NEGATIVE mg/dL
Specific Gravity, Urine: 1.027 (ref 1.005–1.030)
pH: 5 (ref 5.0–8.0)

## 2022-04-07 LAB — COMPREHENSIVE METABOLIC PANEL
ALT: 38 U/L (ref 0–44)
AST: 38 U/L (ref 15–41)
Albumin: 3.7 g/dL (ref 3.5–5.0)
Alkaline Phosphatase: 76 U/L (ref 38–126)
Anion gap: 5 (ref 5–15)
BUN: 11 mg/dL (ref 6–20)
CO2: 24 mmol/L (ref 22–32)
Calcium: 9.5 mg/dL (ref 8.9–10.3)
Chloride: 109 mmol/L (ref 98–111)
Creatinine, Ser: 1.02 mg/dL — ABNORMAL HIGH (ref 0.44–1.00)
GFR, Estimated: >60 mL/min (ref >60)
Glucose, Bld: 200 mg/dL — ABNORMAL HIGH (ref 70–99)
Potassium: 4.2 mmol/L (ref 3.5–5.1)
Sodium: 138 mmol/L (ref 135–145)
Total Bilirubin: 0.6 mg/dL (ref 0.3–1.2)
Total Protein: 7.7 g/dL (ref 6.5–8.1)

## 2022-04-07 LAB — CBC WITH DIFFERENTIAL/PLATELET
Abs Immature Granulocytes: 0.01 10*3/uL (ref 0.00–0.07)
Basophils Absolute: 0 10*3/uL (ref 0.0–0.1)
Basophils Relative: 1 %
Eosinophils Absolute: 0.3 10*3/uL (ref 0.0–0.5)
Eosinophils Relative: 6 %
HCT: 37.1 % (ref 36.0–46.0)
Hemoglobin: 11.6 g/dL — ABNORMAL LOW (ref 12.0–15.0)
Immature Granulocytes: 0 %
Lymphocytes Relative: 58 %
Lymphs Abs: 2.8 10*3/uL (ref 0.7–4.0)
MCH: 29.3 pg (ref 26.0–34.0)
MCHC: 31.3 g/dL (ref 30.0–36.0)
MCV: 93.7 fL (ref 80.0–100.0)
Monocytes Absolute: 0.5 10*3/uL (ref 0.1–1.0)
Monocytes Relative: 9 %
Neutro Abs: 1.3 10*3/uL — ABNORMAL LOW (ref 1.7–7.7)
Neutrophils Relative %: 26 %
Platelets: 407 10*3/uL — ABNORMAL HIGH (ref 150–400)
RBC: 3.96 MIL/uL (ref 3.87–5.11)
RDW: 14 % (ref 11.5–15.5)
WBC: 4.9 10*3/uL (ref 4.0–10.5)
nRBC: 0 % (ref 0.0–0.2)

## 2022-04-07 LAB — LIPASE, BLOOD: Lipase: 53 U/L — ABNORMAL HIGH (ref 11–51)

## 2022-04-07 LAB — POC URINE PREG, ED: Preg Test, Ur: NEGATIVE

## 2022-04-07 MED ORDER — HYDROMORPHONE HCL 1 MG/ML IJ SOLN
1.0000 mg | Freq: Once | INTRAMUSCULAR | Status: AC
  Administered 2022-04-07: 1 mg via INTRAVENOUS
  Filled 2022-04-07: qty 1

## 2022-04-07 MED ORDER — OXYCODONE-ACETAMINOPHEN 5-325 MG PO TABS: 1.0000 | ORAL_TABLET | Freq: Three times a day (TID) | ORAL | 0 refills | Status: AC | PRN

## 2022-04-07 MED ORDER — IOHEXOL 300 MG/ML  SOLN
100.0000 mL | Freq: Once | INTRAMUSCULAR | Status: AC | PRN
Start: 2022-04-07 — End: 2022-04-07
  Administered 2022-04-07: 100 mL via INTRAVENOUS

## 2022-04-07 MED ORDER — PANTOPRAZOLE SODIUM 40 MG PO TBEC
40.0000 mg | DELAYED_RELEASE_TABLET | Freq: Every day | ORAL | 0 refills | Status: AC
Start: 2022-04-07 — End: 2022-04-21

## 2022-04-07 MED ORDER — ONDANSETRON HCL 4 MG/2ML IJ SOLN
4.0000 mg | Freq: Once | INTRAMUSCULAR | Status: AC
Start: 2022-04-07 — End: 2022-04-07
  Administered 2022-04-07: 4 mg via INTRAVENOUS
  Filled 2022-04-07: qty 2

## 2022-04-07 MED ORDER — LACTATED RINGERS IV BOLUS
1000.0000 mL | Freq: Once | INTRAVENOUS | Status: AC
  Administered 2022-04-07: 1000 mL via INTRAVENOUS

## 2022-04-07 MED ORDER — PANTOPRAZOLE SODIUM 40 MG IV SOLR
40.0000 mg | Freq: Once | INTRAVENOUS | Status: AC
Start: 2022-04-07 — End: 2022-04-07
  Administered 2022-04-07: 40 mg via INTRAVENOUS
  Filled 2022-04-07: qty 10

## 2022-04-07 MED ORDER — OXYCODONE HCL 5 MG PO TABS
5.0000 mg | ORAL_TABLET | ORAL | Status: AC
  Administered 2022-04-07: 5 mg via ORAL
  Filled 2022-04-07: qty 1

## 2022-04-07 MED ORDER — ONDANSETRON 4 MG PO TBDP: 4.0000 mg | ORAL_TABLET | Freq: Three times a day (TID) | ORAL | 0 refills | Status: AC | PRN

## 2022-04-07 NOTE — ED Triage Notes (Signed)
BIB [facility] from home for pancreatitis flair per pt. She has scheduled apt this coming week to have labs drawn for her kidneys. Per EMS vitals normal.  ?

## 2022-04-07 NOTE — Discharge Instructions (Addendum)
Your Ct today showed: ?1. Similar appearance of the pancreas status post transgastric cyst ?gastrostomy drainage in stent placement in comparison to most recent ?prior; no definitive residual fluid collection is noted at the site ?of the stents. No new peripancreatic fat stranding. Recommend ?correlation with lipase levels. ?2. Hepatic steatosis. ?3. Small volume free fluid. ?4. Moderate predominately RIGHT-sided colonic stool burden. ?

## 2022-04-07 NOTE — ED Provider Notes (Signed)
? ?[facility] ?Provider Note ? ? ? Event Date/Time  ? First MD Initiated Contact with Patient 04/07/22 1724   ?  (approximate) ? ? ?History  ? ?Abdominal Pain ? ? ?HPI ? ?Sierra Wiley is a 51 y.o. female  female with history of ERCP induced acute pancreatitis, cholecystectomy, hepatic steatosis, hypertension, asthma, anxiety, bipolar type I depressed and recent admission discharged on 3/30 for management of recurrent pancreatitis who presents via EMS from home for evaluation of 5 or 6 hours of acute worsening of some epigastric abdominal pain rating to the back.  Patient endorses chronic diarrhea which is not different today.  No blood.  No fevers, chest pain, cough, shortness of breath, nausea, vomiting, headache, earache, sore throat, urinary symptoms rash or extremity pain.  She denies tobacco use or illicit drug use.  She states has been taking naproxen that was prescribed to her when she was last year was recently told there was something on the kidneys but is not sure what this is from.  She has no other acute concerns at this time and feels this is similar to prior pancreatitis attack she has had in the past. ? ?  ? ? ?Physical Exam  ?Triage Vital Signs: ?ED Triage Vitals  ?Enc Vitals Group  ?   BP   ?   Pulse   ?   Resp   ?   Temp   ?   Temp src   ?   SpO2   ?   Weight   ?   Height   ?   Head Circumference   ?   Peak Flow   ?   Pain Score   ?   Pain Loc   ?   Pain Edu?   ?   Excl. in GC?   ? ? ?Most recent vital signs: ?Vitals:  ? 04/07/22 1800 04/07/22 1830  ?BP: (!) 144/82 130/86  ?Pulse: 74 72  ?Resp: 18 (!) 27  ?Temp:    ?SpO2: 99% 98%  ? ? ?General: Awake, appears uncomfortable. ?CV:  Good peripheral perfusion.  2+ radial pulse. ?Resp:  Normal effort.  Clear. ?Abd:  No distention.  Tender in the epigastrium periumbilical region. ?Other:  No CVA tenderness.  Dry mucous membranes. ? ? ?ED Results / Procedures / Treatments  ?Labs ?(all labs ordered are listed, but only abnormal  results are displayed) ?Labs Reviewed  ?CBC WITH DIFFERENTIAL/PLATELET - Abnormal; Notable for the following components:  ?    Result Value  ? Hemoglobin 11.6 (*)   ? Platelets 407 (*)   ? Neutro Abs 1.3 (*)   ? All other components within normal limits  ?COMPREHENSIVE METABOLIC PANEL - Abnormal; Notable for the following components:  ? Glucose, Bld 200 (*)   ? Creatinine, Ser 1.02 (*)   ? All other components within normal limits  ?LIPASE, BLOOD - Abnormal; Notable for the following components:  ? Lipase 53 (*)   ? All other components within normal limits  ?URINALYSIS, COMPLETE (UACMP) WITH MICROSCOPIC - Abnormal; Notable for the following components:  ? Color, Urine YELLOW (*)   ? APPearance HAZY (*)   ? Bacteria, UA RARE (*)   ? All other components within normal limits  ?POC URINE PREG, ED  ? ? ? ?EKG ? ?EKG is remarkable for sinus rhythm with a ventricular rate of 78, normal axis, unremarkable intervals without evidence of acute ischemia or significant arrhythmia. ? ? ?RADIOLOGY ?CT abdomen pelvis on my  review shows no clear peripancreatic abscess or large cyst, diverticulitis, appendicitis, kidney stone or other clear acute process.  Also reviewed radiology dictation and agree with their findings of similar appearance of status post transgastric cyst gastrostomy drainage with stent in place without residual fluid as well as hepatic steatosis, small volume free fluid and some stool burden noted on the right colon.  No other acute process noted by radiology. ? ? ?PROCEDURES: ? ?Critical Care performed: No ? ?.1-3 Lead EKG Interpretation ?Performed by: Gilles ChiquitoSmith, Genevive Printup P, MD ?Authorized by: Gilles ChiquitoSmith, Fannie Alomar P, MD  ? ?  Interpretation: normal   ?  ECG rate assessment: normal   ?  Rhythm: sinus rhythm   ?  Ectopy: none   ?  Conduction: normal   ? ?The patient is on the cardiac monitor to evaluate for evidence of arrhythmia and/or significant heart rate changes. ? ? ?MEDICATIONS ORDERED IN ED: ?Medications  ?oxyCODONE  (Oxy IR/ROXICODONE) immediate release tablet 5 mg (has no administration in time range)  ?lactated ringers bolus 1,000 mL (0 mLs Intravenous Stopped 04/07/22 1856)  ?ondansetron [facility](ZOFRAN) injection 4 mg (4 mg Intravenous Given 04/07/22 1806)  ?HYDROmorphone (DILAUDID) injection 1 mg (1 mg Intravenous Given 04/07/22 1804)  ?pantoprazole (PROTONIX) injection 40 mg (40 mg Intravenous Given 04/07/22 1808)  ?iohexol (OMNIPAQUE) 300 MG/ML solution 100 mL (100 mLs Intravenous Contrast Given 04/07/22 1855)  ? ? ? ?IMPRESSION / MDM / ASSESSMENT AND PLAN / ED COURSE  ?I reviewed the triage vital signs and the nursing notes. ?             ?               ? ?Differential diagnosis includes, but is not limited to, recurrent pancreatitis possibly complicated by pseudocyst or abscess, hepatitis, gastritis, cystitis, diverticulitis versus atypical angina. ? ?EKG is remarkable for sinus rhythm with a ventricular rate of 78, normal axis, unremarkable intervals without evidence of acute ischemia or significant arrhythmia.  Overall low suspicion for ACS at this time. ? ?CBC without leukocytosis or acute anemia.  CMP is remarkable for glucose of 200 without other significant electrolyte or metabolic derangements.  Lipase is 53.  Pregnancy test is negative.  UA shows rare bacteria but otherwise no evidence of clinically significant cystitis. ? ?CT abdomen pelvis on my review shows no clear peripancreatic abscess or large cyst, diverticulitis, appendicitis, kidney stone or other clear acute process.  Also reviewed radiology dictation and agree with their findings of similar appearance of status post transgastric cyst gastrostomy drainage with stent in place without residual fluid as well as hepatic steatosis, small volume free fluid and some stool burden noted on the right colon.  No other acute process noted by radiology. ? ?Suspect some acute gastritis possibly infectious versus some mild pancreatitis without any evidence of complication on CT.  I  do not believe patient is septic and she is otherwise is feeling much better after some Protonix and Zofran and analgesia.  She is able to tolerate p.o.  I do not believe she requires hospitalization at this time I think it is appropriate for her to have close outpatient follow-up with her GI doctor.  Will write for Rx for Protonix short course of Percocet and some Zofran.  Discussed avoiding any alcohol or NSAIDs that may further irritate her stomach.  She will return for any new or worsening symptoms.  Discharged in stable condition pain strict turn precautions advised and discussed.  Instructed to have glucose rechecked by  PCP visit next week as well as kidney function. ? ?  ? ? ?FINAL CLINICAL IMPRESSION(S) / ED DIAGNOSES  ? ?Final diagnoses:  ?Epigastric pain  ?Hyperglycemia  ?Hypertension, unspecified type  ? ? ? ?Rx / DC Orders  ? ?ED Discharge Orders   ? ?      Ordered  ?  ondansetron (ZOFRAN-ODT) 4 MG disintegrating tablet  Every 8 hours PRN       ? 04/07/22 1942  ?  pantoprazole (PROTONIX) 40 MG tablet  Daily       ? 04/07/22 1942  ?  oxyCODONE-acetaminophen (PERCOCET) 5-325 MG tablet  Every 8 hours PRN       ? 04/07/22 1942  ? ?  ?  ? ?  ? ? ? ?Note:  This document was prepared using Dragon voice recognition software and may include unintentional dictation errors. ?  ?Gilles Chiquito, MD ?04/07/22 1949 ? ?

## 2022-05-19 ENCOUNTER — Emergency Department: Payer: Medicaid Other

## 2022-05-19 ENCOUNTER — Other Ambulatory Visit: Payer: Self-pay

## 2022-05-19 ENCOUNTER — Emergency Department
Admission: EM | Admit: 2022-05-19 | Discharge: 2022-05-20 | Disposition: A | Discharge status: Home / Self Care | Payer: Medicaid Other | Attending: Emergency Medicine | Admitting: Emergency Medicine

## 2022-05-19 DIAGNOSIS — K861 Other chronic pancreatitis: Secondary | ICD-10-CM

## 2022-05-19 DIAGNOSIS — I10 Essential (primary) hypertension: Secondary | ICD-10-CM | POA: Diagnosis not present

## 2022-05-19 DIAGNOSIS — R1013 Epigastric pain: Secondary | ICD-10-CM | POA: Diagnosis present

## 2022-05-19 LAB — URINALYSIS, ROUTINE W REFLEX MICROSCOPIC
Bilirubin Urine: NEGATIVE
Glucose, UA: >=500 mg/dL — AB
Ketones, ur: NEGATIVE mg/dL
Leukocytes,Ua: NEGATIVE
Nitrite: NEGATIVE
Protein, ur: NEGATIVE mg/dL
Specific Gravity, Urine: 1.026 (ref 1.005–1.030)
pH: 7 (ref 5.0–8.0)

## 2022-05-19 LAB — COMPREHENSIVE METABOLIC PANEL
ALT: 17 U/L (ref 0–44)
AST: 15 U/L (ref 15–41)
Albumin: 3.8 g/dL (ref 3.5–5.0)
Alkaline Phosphatase: 81 U/L (ref 38–126)
Anion gap: 5 (ref 5–15)
BUN: 10 mg/dL (ref 6–20)
CO2: 27 mmol/L (ref 22–32)
Calcium: 8.7 mg/dL — ABNORMAL LOW (ref 8.9–10.3)
Chloride: 102 mmol/L (ref 98–111)
Creatinine, Ser: 1.02 mg/dL — ABNORMAL HIGH (ref 0.44–1.00)
GFR, Estimated: >60 mL/min (ref >60)
Glucose, Bld: 376 mg/dL — ABNORMAL HIGH (ref 70–99)
Potassium: 4.2 mmol/L (ref 3.5–5.1)
Sodium: 134 mmol/L — ABNORMAL LOW (ref 135–145)
Total Bilirubin: 0.5 mg/dL (ref 0.3–1.2)
Total Protein: 7.3 g/dL (ref 6.5–8.1)

## 2022-05-19 LAB — CBC
HCT: 36.5 % (ref 36.0–46.0)
Hemoglobin: 11.8 g/dL — ABNORMAL LOW (ref 12.0–15.0)
MCH: 28.7 pg (ref 26.0–34.0)
MCHC: 32.3 g/dL (ref 30.0–36.0)
MCV: 88.8 fL (ref 80.0–100.0)
Platelets: 326 10*3/uL (ref 150–400)
RBC: 4.11 MIL/uL (ref 3.87–5.11)
RDW: 13 % (ref 11.5–15.5)
WBC: 5.2 10*3/uL (ref 4.0–10.5)
nRBC: 0 % (ref 0.0–0.2)

## 2022-05-19 LAB — LIPASE, BLOOD: Lipase: 29 U/L (ref 11–51)

## 2022-05-19 LAB — PREGNANCY, URINE: Preg Test, Ur: NEGATIVE

## 2022-05-19 MED ORDER — HYDROMORPHONE HCL 1 MG/ML IJ SOLN
1.0000 mg | Freq: Once | INTRAMUSCULAR | Status: AC
  Administered 2022-05-19: 1 mg via INTRAVENOUS
  Filled 2022-05-19: qty 1

## 2022-05-19 MED ORDER — ONDANSETRON HCL 4 MG/2ML IJ SOLN
4.0000 mg | Freq: Once | INTRAMUSCULAR | Status: AC
  Administered 2022-05-19: 4 mg via INTRAVENOUS
  Filled 2022-05-19: qty 2

## 2022-05-19 MED ORDER — ONDANSETRON 4 MG PO TBDP: 4.0000 mg | ORAL_TABLET | Freq: Three times a day (TID) | ORAL | 0 refills | Status: AC | PRN

## 2022-05-19 MED ORDER — FAMOTIDINE 20 MG PO TABS: 20.0000 mg | ORAL_TABLET | Freq: Two times a day (BID) | ORAL | 0 refills | Status: AC

## 2022-05-19 MED ORDER — OXYCODONE HCL 5 MG PO TABS: 5.0000 mg | ORAL_TABLET | Freq: Four times a day (QID) | ORAL | 0 refills | Status: DC | PRN

## 2022-05-19 MED ORDER — ALUM & MAG HYDROXIDE-SIMETH 200-200-20 MG/5ML PO SUSP
30.0000 mL | Freq: Once | ORAL | Status: AC
  Administered 2022-05-19: 30 mL via ORAL
  Filled 2022-05-19: qty 30

## 2022-05-19 MED ORDER — IOHEXOL 300 MG/ML  SOLN
80.0000 mL | Freq: Once | INTRAMUSCULAR | Status: AC | PRN
Start: 2022-05-19 — End: 2022-05-19
  Administered 2022-05-19: 75 mL via INTRAVENOUS

## 2022-05-19 MED ORDER — PANTOPRAZOLE SODIUM 40 MG IV SOLR
40.0000 mg | Freq: Once | INTRAVENOUS | Status: AC
Start: 2022-05-19 — End: 2022-05-19
  Administered 2022-05-19: 40 mg via INTRAVENOUS
  Filled 2022-05-19: qty 10

## 2022-05-19 MED ORDER — LACTATED RINGERS IV BOLUS
1000.0000 mL | Freq: Once | INTRAVENOUS | Status: AC
Start: 2022-05-19 — End: 2022-05-19
  Administered 2022-05-19: 1000 mL via INTRAVENOUS

## 2022-05-19 NOTE — ED Notes (Signed)
Patient tolerated PO challenge well.  

## 2022-05-19 NOTE — ED Triage Notes (Signed)
First Nurse Note:  Pt via [facility] from home. Pt c/o epigastric pain, pt has inflamed pancreatis. Pt endorses vomiting. Pt is A&Ox4 and NAD.   164/91 72 HR 100% on RA 414 CBG with hx of DM

## 2022-05-19 NOTE — ED Notes (Signed)
Pt to ED for LUQ abdominal pain since this morning and 3 episodes of vomiting today. Pt has extensive pancreatitis hx from gallstones in common bile duct which led to 2 month long hospitalization in 2022. Since 2022 had to check CBG regularly, take pancreatic enzymes, and has had several pancreatitis flare ups.

## 2022-05-19 NOTE — ED Provider Notes (Signed)
[facility] Provider Note    Event Date/Time   First MD Initiated Contact with Patient 05/19/22 1641     (approximate)   History   Chief Complaint: Abdominal Pain   HPI  Sierra Wiley is a 51 y.o. female with a history of bipolar disorder, pancreatitis, hypertension who comes ED complaining of epigastric pain radiating to her back associated with hyperglycemia nausea and vomiting is being ongoing for the past 24 hours.  Feels like pancreatitis that she has had in the past.  Reviewed outside records, noting that patient had pancreatitis initially as a complication of ERCP, which resulted in critical illness requiring intubation and prolonged ICU stay and tracheostomy.  She developed pseudocyst which had been drained through a transgastric approach.  Patient denies any new fever chills chest pain or shortness of breath.  No palpitations or syncope.     Physical Exam   Triage Vital Signs: ED Triage Vitals [05/19/22 1536]  Enc Vitals Group     BP 139/87     Pulse Rate 66     Resp 18     Temp 98.8 F (37.1 C)     Temp Source Oral     SpO2 100 %     Weight 184 lb (83.5 kg)     Height 5\' 4"  (1.626 m)     Head Circumference      Peak Flow      Pain Score 6     Pain Loc      Pain Edu?      Excl. in GC?     Most recent vital signs: Vitals:   05/19/22 2100 05/19/22 2300  BP: 119/74 118/79  Pulse: 72 72  Resp: 13 15  Temp:    SpO2: 97% 100%    General: Awake, no distress.  CV:  Good peripheral perfusion.  Regular rate and rhythm. Resp:  Normal effort.  Clear to auscultation bilaterally Abd:  No distention.  Epigastric tenderness. Other:  Moist oral mucosa.  No rash.  No lower extremity edema.  No ascites   ED Results / Procedures / Treatments   Labs (all labs ordered are listed, but only abnormal results are displayed) Labs Reviewed  COMPREHENSIVE METABOLIC PANEL - Abnormal; Notable for the following components:      Result Value    Sodium 134 (*)    Glucose, Bld 376 (*)    Creatinine, Ser 1.02 (*)    Calcium 8.7 (*)    All other components within normal limits  CBC - Abnormal; Notable for the following components:   Hemoglobin 11.8 (*)    All other components within normal limits  URINALYSIS, ROUTINE W REFLEX MICROSCOPIC - Abnormal; Notable for the following components:   Color, Urine STRAW (*)    APPearance CLEAR (*)    Glucose, UA >=500 (*)    Hgb urine dipstick LARGE (*)    Bacteria, UA RARE (*)    All other components within normal limits  LIPASE, BLOOD  PREGNANCY, URINE     EKG    RADIOLOGY CT abdomen pelvis viewed and interpreted by me, shows irregular appearance of the pancreas with drainage #other in position and location of pseudocyst.  No free air, no large volume free fluid.  No evidence of bowel obstruction   PROCEDURES:  Procedures   MEDICATIONS ORDERED IN ED: Medications  pantoprazole (PROTONIX) injection 40 mg (40 mg Intravenous Given 05/19/22 1810)  HYDROmorphone (DILAUDID) injection 1 mg (1 mg Intravenous Given 05/19/22  1811)  ondansetron (ZOFRAN) injection 4 mg (4 mg Intravenous Given 05/19/22 1811)  lactated ringers bolus 1,000 mL (0 mLs Intravenous Stopped 05/19/22 1942)  iohexol (OMNIPAQUE) 300 MG/ML solution 80 mL (75 mLs Intravenous Contrast Given 05/19/22 2127)  alum & mag hydroxide-simeth (MAALOX/MYLANTA) 200-200-20 MG/5ML suspension 30 mL (30 mLs Oral Given 05/19/22 2231)     IMPRESSION / MDM / ASSESSMENT AND PLAN / ED COURSE  I reviewed the triage vital signs and the nursing notes.                              Differential diagnosis includes, but is not limited to, acute on chronic pancreatitis, intra-abdominal abscess, GI perforation, small bowel obstruction, colitis  Patient's presentation is most consistent with severe exacerbation of chronic illness.  Patient presents with severe epigastric pain which feels like previous episodes of pancreatitis.  Vital signs are  unremarkable, will give patient IV Dilaudid for pain control, IV Zofran for nausea relief, IV Protonix and IV fluids for hydration.  We will need to obtain labs and a CT scan.  I doubt AAA, dissection, or mesenteric ischemia.   Clinical Course as of 05/19/22 2341  Wynelle Link May 19, 2022  2042 Still awaiting CT, which has been on hold waiting for U. Preg result from lab add-on. I called lab who will perform test now.  [PS]    Clinical Course User Index [PS] Sharman Cheek, MD    ----------------------------------------- 11:45 PM on 05/19/2022 ----------------------------------------- CT shows mild evidence of pancreatitis, which appears unchanged from previous and possibly chronic.  Patient's labs are all normal, lipase is normal.  She reports the pain resolved after receiving IV Dilaudid.  She is feeling better, and tolerating oral intake and agreeable with outpatient management with symptom relief, bland diet, follow-up with primary care.  Return precautions discussed.  Not requiring admission due to reassuring work-up and improvement of symptoms.   FINAL CLINICAL IMPRESSION(S) / ED DIAGNOSES   Final diagnoses:  Chronic pancreatitis, unspecified pancreatitis type (HCC)     Rx / DC Orders   ED Discharge Orders          Ordered    oxyCODONE (ROXICODONE) 5 MG immediate release tablet  Every 6 hours PRN        05/19/22 2340    ondansetron (ZOFRAN-ODT) 4 MG disintegrating tablet  Every 8 hours PRN        05/19/22 2340    famotidine (PEPCID) 20 MG tablet  2 times daily        05/19/22 2340             Note:  This document was prepared using Dragon voice recognition software and may include unintentional dictation errors.   Sharman Cheek, MD 05/19/22 [phone]

## 2022-05-19 NOTE — ED Triage Notes (Signed)
Pt states she has not been feeling well with abd pain- pt has a hx of pancreatitis and checks her cbg every day d/t that- pt states today EMS checked it and it was 414 which is higher than her usual- pt states that normally it is 200 or less

## 2022-06-10 ENCOUNTER — Emergency Department
Admission: EM | Admit: 2022-06-10 | Discharge: 2022-06-10 | Disposition: A | Discharge status: Home / Self Care | Payer: Medicaid Other | Attending: Emergency Medicine | Admitting: Emergency Medicine

## 2022-06-10 ENCOUNTER — Other Ambulatory Visit: Payer: Self-pay

## 2022-06-10 DIAGNOSIS — Z7984 Long term (current) use of oral hypoglycemic drugs: Secondary | ICD-10-CM | POA: Diagnosis not present

## 2022-06-10 DIAGNOSIS — E1165 Type 2 diabetes mellitus with hyperglycemia: Secondary | ICD-10-CM | POA: Diagnosis not present

## 2022-06-10 DIAGNOSIS — R739 Hyperglycemia, unspecified: Secondary | ICD-10-CM | POA: Diagnosis present

## 2022-06-10 LAB — HEPATIC FUNCTION PANEL
ALT: 15 U/L (ref 0–44)
AST: 15 U/L (ref 15–41)
Albumin: 3.8 g/dL (ref 3.5–5.0)
Alkaline Phosphatase: 79 U/L (ref 38–126)
Bilirubin, Direct: <0.1 mg/dL (ref 0.0–0.2)
Total Bilirubin: 0.5 mg/dL (ref 0.3–1.2)
Total Protein: 7.1 g/dL (ref 6.5–8.1)

## 2022-06-10 LAB — URINALYSIS, ROUTINE W REFLEX MICROSCOPIC
Bacteria, UA: NONE SEEN
Bilirubin Urine: NEGATIVE
Glucose, UA: >=500 mg/dL — AB
Hgb urine dipstick: NEGATIVE
Ketones, ur: NEGATIVE mg/dL
Leukocytes,Ua: NEGATIVE
Nitrite: NEGATIVE
Protein, ur: NEGATIVE mg/dL
Specific Gravity, Urine: 1.023 (ref 1.005–1.030)
pH: 6 (ref 5.0–8.0)

## 2022-06-10 LAB — CBC
HCT: 37.7 % (ref 36.0–46.0)
Hemoglobin: 12.2 g/dL (ref 12.0–15.0)
MCH: 28.4 pg (ref 26.0–34.0)
MCHC: 32.4 g/dL (ref 30.0–36.0)
MCV: 87.9 fL (ref 80.0–100.0)
Platelets: 286 10*3/uL (ref 150–400)
RBC: 4.29 MIL/uL (ref 3.87–5.11)
RDW: 13.2 % (ref 11.5–15.5)
WBC: 5.7 10*3/uL (ref 4.0–10.5)
nRBC: 0 % (ref 0.0–0.2)

## 2022-06-10 LAB — HCG, QUANTITATIVE, PREGNANCY: hCG, Beta Chain, Quant, S: 1 m[IU]/mL (ref <5)

## 2022-06-10 LAB — BASIC METABOLIC PANEL
Anion gap: 5 (ref 5–15)
BUN: 12 mg/dL (ref 6–20)
CO2: 25 mmol/L (ref 22–32)
Calcium: 9 mg/dL (ref 8.9–10.3)
Chloride: 107 mmol/L (ref 98–111)
Creatinine, Ser: 0.84 mg/dL (ref 0.44–1.00)
GFR, Estimated: >60 mL/min (ref >60)
Glucose, Bld: 341 mg/dL — ABNORMAL HIGH (ref 70–99)
Potassium: 3.7 mmol/L (ref 3.5–5.1)
Sodium: 137 mmol/L (ref 135–145)

## 2022-06-10 LAB — CBG MONITORING, ED
Glucose-Capillary: 407 mg/dL — ABNORMAL HIGH (ref 70–99)
Glucose-Capillary: 211 mg/dL — ABNORMAL HIGH (ref 70–99)

## 2022-06-10 LAB — PREGNANCY, URINE: Preg Test, Ur: NEGATIVE

## 2022-06-10 LAB — LIPASE, BLOOD: Lipase: 28 U/L (ref 11–51)

## 2022-06-10 MED ORDER — SODIUM CHLORIDE 0.9 % IV BOLUS
1000.0000 mL | Freq: Once | INTRAVENOUS | Status: AC
  Administered 2022-06-10: 1000 mL via INTRAVENOUS

## 2022-06-10 NOTE — ED Triage Notes (Signed)
Patient to ER via [facility] from home. Patient with complaints of  Hyperglycemia, reports a recent diabetes diagnosis. Patient has been taking her metformin as prescribed. States that her cbg was 435 today, and has been ranging between 390-450. Reports feeling weak and fatigued. No NVD  EMS- 300cc fluid. 20G rac VSS.

## 2022-06-10 NOTE — ED Provider Notes (Signed)
[facility] Provider Note  Patient Contact: 9:33 PM (approximate)   History   Hyperglycemia   HPI  Sierra Wiley is a 51 y.o. female who presents the emergency department feeling weak and dizzy.  Patient states that she has been having some recent issues with her blood sugar.  Patient states that she was on 1 pill of metformin, has been increased to 2 pills of metformin.  She used to have sugars that ranged always in the 100s but has been experiencing blood sugars ranging in the 300s.  Her A1c was 11.6 most recently.  She states that she has not had any other symptoms such as fevers, chills, nasal congestion, sore throat, cough, emesis, diarrhea, urinary changes to include dysuria, polyuria or hematuria.  Patient has chronic epigastric pain and states that she has chronic pancreatitis.  No changes in this reported pain.  She states that she has mostly felt weak and dizzy, checked her blood sugar and it was 435 today.  EMS administered 300 mL of normal saline in route.  On arrival patient's fingerstick blood glucose was 407     Physical Exam   Triage Vital Signs: ED Triage Vitals  Enc Vitals Group     BP 06/10/22 1856 122/73     Pulse Rate 06/10/22 1856 91     Resp 06/10/22 1856 17     Temp 06/10/22 1856 98 F (36.7 C)     Temp Source 06/10/22 1856 Oral     SpO2 06/10/22 1856 98 %     Weight --      Height --      Head Circumference --      Peak Flow --      Pain Score 06/10/22 1853 6     Pain Loc --      Pain Edu? --      Excl. in GC? --     Most recent vital signs: Vitals:   06/10/22 1856  BP: 122/73  Pulse: 91  Resp: 17  Temp: 98 F (36.7 C)  SpO2: 98%     General: Alert and in no acute distress.  Neck: No stridor. No cervical spine tenderness to palpation. Hematological/Lymphatic/Immunilogical: No cervical lymphadenopathy. Cardiovascular:  Good peripheral perfusion Respiratory: Normal respiratory effort without tachypnea or  retractions. Lungs CTAB. Good air entry to the bases with no decreased or absent breath sounds Gastrointestinal: Bowel sounds 4 quadrants. Soft and nontender to palpation. No guarding or rigidity. No palpable masses. No distention. No CVA tenderness. Musculoskeletal: Full range of motion to all extremities.  Neurologic:  No gross focal neurologic deficits are appreciated.  Skin:   No rash noted Other:   ED Results / Procedures / Treatments   Labs (all labs ordered are listed, but only abnormal results are displayed) Labs Reviewed  URINALYSIS, ROUTINE W REFLEX MICROSCOPIC - Abnormal; Notable for the following components:      Result Value   Color, Urine COLORLESS (*)    APPearance CLEAR (*)    Glucose, UA >=500 (*)    All other components within normal limits  BASIC METABOLIC PANEL - Abnormal; Notable for the following components:   Glucose, Bld 341 (*)    All other components within normal limits  CBG MONITORING, ED - Abnormal; Notable for the following components:   Glucose-Capillary 407 (*)    All other components within normal limits  CBG MONITORING, ED - Abnormal; Notable for the following components:   Glucose-Capillary 211 (*)  All other components within normal limits  CBC  HEPATIC FUNCTION PANEL  HCG, QUANTITATIVE, PREGNANCY  PREGNANCY, URINE  LIPASE, BLOOD  CBG MONITORING, ED     EKG     RADIOLOGY    No results found.  PROCEDURES:  Critical Care performed: No  Procedures   MEDICATIONS ORDERED IN ED: Medications  sodium chloride 0.9 % bolus 1,000 mL (0 mLs Intravenous Stopped 06/10/22 2108)     IMPRESSION / MDM / ASSESSMENT AND PLAN / ED COURSE  I reviewed the triage vital signs and the nursing notes.                              Differential diagnosis includes, but is not limited to, DKA, hyperosmolar state, hyperglycemia, pancreatitis, viral illness   Patient's presentation is most consistent with acute presentation with potential  threat to life or bodily function.   Patient's diagnosis is consistent with hyperglycemia.  Patient presented to the emergency department with weak and dizzy symptoms.  Patient states that she is a diabetic, her blood sugars have not been well controlled recently.  She saw her primary care and they doubled her metformin but still notes that her blood sugars typically run in the 300s.  Patient states that today her blood sugar was 435 and she felt "off."  Physical exam is reassuring at this time.  Labs reveal elevated glucose on arrival with a fingerstick of 407.  After receiving fluids patient then improved to 341 and was given another liter of fluids.  Patient has improved to 211 at last fingerstick.  She is feeling more like her normal self at this time.  Lipase, urinalysis is reassuring.  Patient does not have an elevated gap and does not have symptoms concerning for DKA at this time.  As her blood sugar is already improved to 211 which is better than her baseline I will not administer insulin currently.  I do recommend following up with primary care to talk about unmanaged type 2 diabetes.  I feel the patient may be a type 1.5 or even transitioning into a type I.  Regardless I do feel that she will need more aggressive management of her diabetes long-term.  Patient is agreeable with this plan.  Concerning signs and symptoms as well as concerning glucose readings are discussed with the patient to return to the ED.  Otherwise follow-up with primary care.       FINAL CLINICAL IMPRESSION(S) / ED DIAGNOSES   Final diagnoses:  Type 2 diabetes mellitus with hyperglycemia, without long-term current use of insulin (HCC)     Rx / DC Orders   ED Discharge Orders     None        Note:  This document was prepared using Dragon voice recognition software and may include unintentional dictation errors.   Lanette Hampshire 06/10/22 2248    Sharyn Creamer, MD 06/10/22 2252

## 2022-06-10 NOTE — ED Notes (Signed)
Fsbs 211 

## 2022-07-12 ENCOUNTER — Other Ambulatory Visit: Payer: Self-pay

## 2022-07-12 ENCOUNTER — Emergency Department
Admission: EM | Admit: 2022-07-12 | Discharge: 2022-07-12 | Disposition: A | Discharge status: Home / Self Care | Payer: Medicaid Other | Attending: Emergency Medicine | Admitting: Emergency Medicine

## 2022-07-12 DIAGNOSIS — R110 Nausea: Secondary | ICD-10-CM | POA: Insufficient documentation

## 2022-07-12 DIAGNOSIS — R1013 Epigastric pain: Secondary | ICD-10-CM | POA: Diagnosis present

## 2022-07-12 LAB — URINALYSIS, ROUTINE W REFLEX MICROSCOPIC
Bacteria, UA: NONE SEEN
Bilirubin Urine: NEGATIVE
Glucose, UA: >=500 mg/dL — AB
Ketones, ur: NEGATIVE mg/dL
Leukocytes,Ua: NEGATIVE
Nitrite: NEGATIVE
Protein, ur: NEGATIVE mg/dL
Specific Gravity, Urine: 1.031 — ABNORMAL HIGH (ref 1.005–1.030)
pH: 5 (ref 5.0–8.0)

## 2022-07-12 LAB — CBC
HCT: 38.8 % (ref 36.0–46.0)
Hemoglobin: 12.4 g/dL (ref 12.0–15.0)
MCH: 28.1 pg (ref 26.0–34.0)
MCHC: 32 g/dL (ref 30.0–36.0)
MCV: 87.8 fL (ref 80.0–100.0)
Platelets: 346 10*3/uL (ref 150–400)
RBC: 4.42 MIL/uL (ref 3.87–5.11)
RDW: 14.2 % (ref 11.5–15.5)
WBC: 4 10*3/uL (ref 4.0–10.5)
nRBC: 0 % (ref 0.0–0.2)

## 2022-07-12 LAB — COMPREHENSIVE METABOLIC PANEL
ALT: 17 U/L (ref 0–44)
AST: 20 U/L (ref 15–41)
Albumin: 3.9 g/dL (ref 3.5–5.0)
Alkaline Phosphatase: 63 U/L (ref 38–126)
Anion gap: 7 (ref 5–15)
BUN: 13 mg/dL (ref 6–20)
CO2: 21 mmol/L — ABNORMAL LOW (ref 22–32)
Calcium: 9.6 mg/dL (ref 8.9–10.3)
Chloride: 108 mmol/L (ref 98–111)
Creatinine, Ser: 0.82 mg/dL (ref 0.44–1.00)
GFR, Estimated: >60 mL/min (ref >60)
Glucose, Bld: 275 mg/dL — ABNORMAL HIGH (ref 70–99)
Potassium: 4 mmol/L (ref 3.5–5.1)
Sodium: 136 mmol/L (ref 135–145)
Total Bilirubin: 0.6 mg/dL (ref 0.3–1.2)
Total Protein: 7.3 g/dL (ref 6.5–8.1)

## 2022-07-12 LAB — PREGNANCY, URINE: Preg Test, Ur: NEGATIVE

## 2022-07-12 LAB — LIPASE, BLOOD: Lipase: 26 U/L (ref 11–51)

## 2022-07-12 MED ORDER — HYDROMORPHONE HCL 1 MG/ML IJ SOLN
1.0000 mg | Freq: Once | INTRAMUSCULAR | Status: AC
  Administered 2022-07-12: 1 mg via INTRAVENOUS
  Filled 2022-07-12: qty 1

## 2022-07-12 MED ORDER — SODIUM CHLORIDE 0.9 % IV BOLUS
1000.0000 mL | Freq: Once | INTRAVENOUS | Status: AC
  Administered 2022-07-12: 1000 mL via INTRAVENOUS

## 2022-07-12 NOTE — ED Provider Notes (Signed)
[facility] Provider Note    Event Date/Time   First MD Initiated Contact with Patient 07/12/22 1602     (approximate)   History   Abdominal Pain   HPI  Sierra Wiley is a 51 y.o. female who per [facility] note dated 04/15/22 has chronic pancreatitis, who presents to the emergency department today because of concerns for abdominal pain and concerns for pancreatitis.  Patient states that she was feeling slightly better yesterday.  However today she started having worsening abdominal pain.  Located in the center abdomen and radiates around to both upper abdomens and her back.  This has been accompanied by nausea.  She denies any fevers.  States she has chronic diarrhea although has not noticed any recent change in her diarrhea.  Physical Exam   Triage Vital Signs: ED Triage Vitals  Enc Vitals Group     BP 07/12/22 1308 120/75     Pulse Rate 07/12/22 1308 79     Resp 07/12/22 1308 18     Temp 07/12/22 1308 98.5 F (36.9 C)     Temp Source 07/12/22 1308 Oral     SpO2 07/12/22 1308 98 %     Weight --      Height 07/12/22 1304 5\' 4"  (1.626 m)     Head Circumference --      Peak Flow --      Pain Score 07/12/22 1306 9     Pain Loc --      Pain Edu? --      Excl. in GC? --     Most recent vital signs: Vitals:   07/12/22 1308  BP: 120/75  Pulse: 79  Resp: 18  Temp: 98.5 F (36.9 C)  SpO2: 98%    General: Awake, alert, oriented. CV:  Good peripheral perfusion. Regular rate and rhythm. Resp:  Normal effort. Lungs clear. Abd:  No distention. Tender to palpation in the epigastric region.    ED Results / Procedures / Treatments   Labs (all labs ordered are listed, but only abnormal results are displayed) Labs Reviewed  COMPREHENSIVE METABOLIC PANEL - Abnormal; Notable for the following components:      Result Value   CO2 21 (*)    Glucose, Bld 275 (*)    All other components within normal limits  URINALYSIS, ROUTINE W REFLEX MICROSCOPIC -  Abnormal; Notable for the following components:   Color, Urine STRAW (*)    APPearance CLEAR (*)    Specific Gravity, Urine 1.031 (*)    Glucose, UA >=500 (*)    Hgb urine dipstick SMALL (*)    All other components within normal limits  LIPASE, BLOOD  CBC  PREGNANCY, URINE     EKG  I, 07/14/22, attending physician, personally viewed and interpreted this EKG  EKG Time: 1317 Rate: 74 Rhythm: normal sinus rhythm Axis: normal Intervals: qtc 461 QRS: narrow ST changes: no st elevation Impression: normal ekg   RADIOLOGY None  PROCEDURES:  Critical Care performed: No  Procedures   MEDICATIONS ORDERED IN ED: Medications - No data to display   IMPRESSION / MDM / ASSESSMENT AND PLAN / ED COURSE  I reviewed the triage vital signs and the nursing notes.                              Differential diagnosis includes, but is not limited to, pancreatitis, gastritis  Patient's presentation is most consistent  with acute presentation with potential threat to life or bodily function.  Patient presented to the emergency department today with epigastric pain which she states reminds her of when she has had pancreatitis in the past.  On exam she is tender in the epigastric region.  Patient is afebrile.  Blood work without concerning leukocytosis.  Lipase was normal.  Did have a discussion with the patient.  Given chronic nature of the pain she felt comfortable deferring imaging which I think is reasonable at this time.  She did feel better after IV medication and fluids.  She felt comfortable going home.  We did discuss importance of patient keeping upcoming GI appointment scheduled in roughly 10 days.  FINAL CLINICAL IMPRESSION(S) / ED DIAGNOSES   Final diagnoses:  Epigastric pain      Note:  This document was prepared using Dragon voice recognition software and may include unintentional dictation errors.    Phineas Semen, MD 07/12/22 [phone]

## 2022-07-12 NOTE — Discharge Instructions (Signed)
Please seek medical attention for any high fevers, chest pain, shortness of breath, change in behavior, persistent vomiting, bloody stool or any other new or concerning symptoms.  

## 2022-07-12 NOTE — ED Notes (Signed)
ED Provider at bedside. 

## 2022-07-12 NOTE — ED Triage Notes (Signed)
Patient to ER via POV with reports of epigastric pain, reports hx of pancreatitis and this feels the same. States that she started having NVD approx 2 hours ago.

## 2022-07-12 NOTE — ED Provider Triage Note (Signed)
Emergency Medicine Provider Triage Evaluation Note  Sierra Wiley , a 51 y.o. female  was evaluated in triage.  Pt complains of abdominal pain with history of non alcoholic pancreatitis. No fever. Several episodes of vomiting and diarrhea over the past 2 hours.   Physical Exam  Ht 5\' 4"  (1.626 m)   BMI 31.58 kg/m  Gen:   Awake, no distress   Resp:  Normal effort  MSK:   Moves extremities without difficulty  Other:    Medical Decision Making  Medically screening exam initiated at 1:06 PM.  Appropriate orders placed.  Nessie Pothier was informed that the remainder of the evaluation will be completed by another provider, this initial triage assessment does not replace that evaluation, and the importance of remaining in the ED until their evaluation is complete.    , FNP 07/12/22 [phone]

## 2022-08-05 ENCOUNTER — Emergency Department: Payer: Medicaid Other

## 2022-08-05 ENCOUNTER — Encounter: Payer: Self-pay | Admitting: Emergency Medicine

## 2022-08-05 ENCOUNTER — Emergency Department
Admission: EM | Admit: 2022-08-05 | Discharge: 2022-08-06 | Disposition: A | Discharge status: Home / Self Care | Payer: Medicaid Other | Attending: Emergency Medicine | Admitting: Emergency Medicine

## 2022-08-05 ENCOUNTER — Other Ambulatory Visit: Payer: Self-pay

## 2022-08-05 DIAGNOSIS — Z7722 Contact with and (suspected) exposure to environmental tobacco smoke (acute) (chronic): Secondary | ICD-10-CM | POA: Insufficient documentation

## 2022-08-05 DIAGNOSIS — R1010 Upper abdominal pain, unspecified: Secondary | ICD-10-CM

## 2022-08-05 DIAGNOSIS — Z79899 Other long term (current) drug therapy: Secondary | ICD-10-CM | POA: Diagnosis not present

## 2022-08-05 DIAGNOSIS — I10 Essential (primary) hypertension: Secondary | ICD-10-CM | POA: Diagnosis not present

## 2022-08-05 DIAGNOSIS — K861 Other chronic pancreatitis: Secondary | ICD-10-CM | POA: Diagnosis not present

## 2022-08-05 DIAGNOSIS — J45909 Unspecified asthma, uncomplicated: Secondary | ICD-10-CM | POA: Insufficient documentation

## 2022-08-05 DIAGNOSIS — Z7951 Long term (current) use of inhaled steroids: Secondary | ICD-10-CM | POA: Insufficient documentation

## 2022-08-05 DIAGNOSIS — Z7952 Long term (current) use of systemic steroids: Secondary | ICD-10-CM | POA: Diagnosis not present

## 2022-08-05 LAB — CBC WITH DIFFERENTIAL/PLATELET
Abs Immature Granulocytes: 0.01 10*3/uL (ref 0.00–0.07)
Basophils Absolute: 0 10*3/uL (ref 0.0–0.1)
Basophils Relative: 1 %
Eosinophils Absolute: 0.1 10*3/uL (ref 0.0–0.5)
Eosinophils Relative: 2 %
HCT: 42.3 % (ref 36.0–46.0)
Hemoglobin: 13.5 g/dL (ref 12.0–15.0)
Immature Granulocytes: 0 %
Lymphocytes Relative: 58 %
Lymphs Abs: 3.5 10*3/uL (ref 0.7–4.0)
MCH: 28.4 pg (ref 26.0–34.0)
MCHC: 31.9 g/dL (ref 30.0–36.0)
MCV: 89.1 fL (ref 80.0–100.0)
Monocytes Absolute: 0.3 10*3/uL (ref 0.1–1.0)
Monocytes Relative: 5 %
Neutro Abs: 2 10*3/uL (ref 1.7–7.7)
Neutrophils Relative %: 34 %
Platelets: 397 10*3/uL (ref 150–400)
RBC: 4.75 MIL/uL (ref 3.87–5.11)
RDW: 14.6 % (ref 11.5–15.5)
WBC: 5.9 10*3/uL (ref 4.0–10.5)
nRBC: 0 % (ref 0.0–0.2)

## 2022-08-05 LAB — COMPREHENSIVE METABOLIC PANEL
ALT: 15 U/L (ref 0–44)
AST: 22 U/L (ref 15–41)
Albumin: 4.4 g/dL (ref 3.5–5.0)
Alkaline Phosphatase: 70 U/L (ref 38–126)
Anion gap: 8 (ref 5–15)
BUN: 12 mg/dL (ref 6–20)
CO2: 25 mmol/L (ref 22–32)
Calcium: 9.9 mg/dL (ref 8.9–10.3)
Chloride: 106 mmol/L (ref 98–111)
Creatinine, Ser: 0.9 mg/dL (ref 0.44–1.00)
GFR, Estimated: >60 mL/min (ref >60)
Glucose, Bld: 138 mg/dL — ABNORMAL HIGH (ref 70–99)
Potassium: 4.7 mmol/L (ref 3.5–5.1)
Sodium: 139 mmol/L (ref 135–145)
Total Bilirubin: 1 mg/dL (ref 0.3–1.2)
Total Protein: 8.3 g/dL — ABNORMAL HIGH (ref 6.5–8.1)

## 2022-08-05 LAB — TROPONIN I (HIGH SENSITIVITY): Troponin I (High Sensitivity): 3 ng/L (ref <18)

## 2022-08-05 LAB — URINALYSIS, ROUTINE W REFLEX MICROSCOPIC
Bilirubin Urine: NEGATIVE
Glucose, UA: >=500 mg/dL — AB
Ketones, ur: NEGATIVE mg/dL
Leukocytes,Ua: NEGATIVE
Nitrite: NEGATIVE
Protein, ur: NEGATIVE mg/dL
Specific Gravity, Urine: 1.03 (ref 1.005–1.030)
pH: 5 (ref 5.0–8.0)

## 2022-08-05 LAB — PREGNANCY, URINE: Preg Test, Ur: NEGATIVE

## 2022-08-05 LAB — LIPASE, BLOOD: Lipase: 29 U/L (ref 11–51)

## 2022-08-05 MED ORDER — IOHEXOL 300 MG/ML  SOLN
100.0000 mL | Freq: Once | INTRAMUSCULAR | Status: AC | PRN
Start: 2022-08-05 — End: 2022-08-05
  Administered 2022-08-05: 100 mL via INTRAVENOUS

## 2022-08-05 MED ORDER — ONDANSETRON HCL 4 MG/2ML IJ SOLN
4.0000 mg | Freq: Once | INTRAMUSCULAR | Status: AC
  Administered 2022-08-05: 4 mg via INTRAVENOUS
  Filled 2022-08-05: qty 2

## 2022-08-05 MED ORDER — IOHEXOL 300 MG/ML  SOLN
100.0000 mL | Freq: Once | INTRAMUSCULAR | Status: AC | PRN
  Administered 2022-08-05: 100 mL via INTRAVENOUS

## 2022-08-05 MED ORDER — HYDROMORPHONE HCL 1 MG/ML IJ SOLN
1.0000 mg | Freq: Once | INTRAMUSCULAR | Status: AC
  Administered 2022-08-05: 1 mg via INTRAVENOUS
  Filled 2022-08-05: qty 1

## 2022-08-05 MED ORDER — SODIUM CHLORIDE 0.9 % IV BOLUS
1000.0000 mL | Freq: Once | INTRAVENOUS | Status: AC
  Administered 2022-08-05: 1000 mL via INTRAVENOUS

## 2022-08-05 NOTE — ED Provider Notes (Signed)
[facility] Provider Note    Event Date/Time   First MD Initiated Contact with Patient 08/05/22 2302     (approximate)   History   Abdominal Pain   HPI  Sierra Wiley is a 51 y.o. female brought to the ED via EMS with a chief complaint of upper abdominal x 3 days.  Denies associated fever, nausea, vomiting or dysuria.  Endorses diarrhea.  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  She is prescribed trazodone through her pain clinic which she states does not work for her pain.     Past Medical History   Past Medical History:  Diagnosis Date   Asthma    Bipolar 1 disorder (HCC)    Chronic back pain    Depression    Opiate use 02/21/2016   Pancreatitis    PTSD (post-traumatic stress disorder)    Seizures (HCC)      Active Problem List   Patient Active Problem List   Diagnosis Date Noted   Second hand smoke exposure 03/27/2022   Abdominal pain 03/26/2022   Nodule of soft tissue 03/26/2022   Intractable vomiting 11/25/2021   Post-ERCP acute pancreatitis 11/25/2021   Hx of post ERCP necrotizing pancreatitis s/p cyst gastrostomy and necrosectomy 11/25/2021   HTN (hypertension) 11/25/2021   Seizures (HCC)    UTI (urinary tract infection)    Hypokalemia    Hypomagnesemia    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 Authorizing Provider  oxyCODONE (ROXICODONE) 5 MG immediate release tablet Take 1 tablet (5 mg total) by mouth every 6 (six) hours as needed for severe pain. 08/06/22  Yes Irean Hong, MD  albuterol (PROVENTIL HFA;VENTOLIN HFA) 108 (90 Base) MCG/ACT inhaler Inhale 2 puffs into the lungs every 6 (six) hours as needed for wheezing or shortness of breath.    [provider]  aripiprazole (ABILIFY) 10 MG disintegrating tablet Take 10 mg by mouth daily.    [provider]  budesonide-formoterol (SYMBICORT) 160-4.5 MCG/ACT inhaler Inhale 2 puffs into the lungs 2 (two) times daily.    [provider]  citalopram (CELEXA) 10 MG tablet Take by mouth. Patient not taking: Reported on 11/26/2021    [provider]  famotidine (PEPCID) 20 MG tablet Take 1 tablet (20 mg total) by mouth 2 (two) times daily. 05/19/22   Sharman Cheek, MD  fluticasone [facility]) 50 MCG/ACT nasal spray Place 1 spray into both nostrils daily.    [provider]  hydrOXYzine (ATARAX/VISTARIL) 10 MG tablet Take 10 mg by mouth every 6 (six) hours as needed.    [provider]  hyoscyamine (ANASPAZ) 0.125 MG TBDP disintergrating tablet Place 0.125 mg under the tongue 3 (three) times daily.    [provider]  lipase/protease/amylase (CREON) 12000-38000 units CPEP capsule Take 60,000 Units by mouth 3 (three) times daily before meals.  [provider]  metFORMIN (GLUCOPHAGE) 500 MG tablet Take 500 mg by mouth daily. 01/21/22   [provider]  olopatadine (PATANOL) 0.1 % ophthalmic solution 1 drop 2 (two) times daily.    [provider]  ondansetron (ZOFRAN-ODT) 4 MG disintegrating tablet Take 1 tablet (4 mg total) by mouth every 8 (eight) hours as needed for nausea or vomiting. 05/19/22   Sharman Cheek, MD  pantoprazole (PROTONIX) 40 MG tablet Take 1 tablet (40 mg total) by mouth daily for 14 days. 04/07/22 04/21/22  Gilles Chiquito, MD  prazosin (MINIPRESS) 2 MG capsule Take 2 mg by mouth at bedtime.    [provider]  pregabalin (LYRICA) 75 MG capsule Take 75 mg by mouth 3 (three) times daily.    [provider]  Tiotropium Bromide Monohydrate (SPIRIVA RESPIMAT) 1.25 MCG/ACT AERS Inhale 2 puffs into the lungs daily for 1 day. 11/08/20 11/09/20  Salena Saner, MD  tiZANidine (ZANAFLEX) 4 MG tablet Take 4 mg by mouth every 6 (six) hours as needed for muscle spasms.    [provider]  traZODone (DESYREL) 100 MG tablet Take 150-300 mg by mouth at bedtime. Patient not taking: Reported on 03/26/2022    [provider]  vitamin B-12 (CYANOCOBALAMIN) 100 MCG tablet Take 100 mcg by mouth daily.    [provider]     Allergies  Penicillins   Family History   Family History  Problem Relation Age of Onset   Alcohol abuse Mother    Drug abuse Mother    HIV Mother    Bipolar disorder Mother    Cirrhosis Mother    Cancer Father    Heart disease Father    Drug abuse Sister    Alcohol abuse Sister      Physical Exam  Triage Vital Signs: ED Triage Vitals  Enc Vitals Group     BP 08/05/22 1837 (!) 122/96     Pulse Rate 08/05/22 1837 74     Resp 08/05/22 1837 18     Temp 08/05/22 1837 99.1 F (37.3 C)     Temp src --      SpO2 08/05/22 1837 99 %     Weight 08/05/22 1844 184 lb (83.5 kg)     Height 08/05/22 1844 5\' 4"  (1.626 m)     Head Circumference --      Peak Flow --      Pain Score 08/05/22 1844 8     Pain Loc --      Pain Edu? --      Excl. in GC? --     Updated Vital Signs: BP 137/82   Pulse 80   Temp 98.4 F (36.9 C) (Oral)   Resp (!) 23   Ht 5\' 4"  (1.626 m)   Wt 83.5 kg   SpO2 100%   BMI 31.58 kg/m    General: Awake, mild distress.  CV:  RRR good peripheral perfusion.  Resp:  Normal effort.  CTAB Abd:  Mildly tender to palpation epigastrium without rebound or guarding.  No distention.  Other:  No truncal vesicles.   ED  Results / Procedures / Treatments  Labs (all labs ordered are listed, but only abnormal results are displayed) Labs Reviewed  COMPREHENSIVE METABOLIC PANEL - Abnormal; Notable for the following components:      Result Value   Glucose, Bld 138 (*)    Total Protein 8.3 (*)    All other components within normal limits  URINALYSIS, ROUTINE W REFLEX MICROSCOPIC - Abnormal; Notable for the following components:   Color, Urine YELLOW (*)    APPearance CLEAR (*)    Glucose, UA >=500 (*)    Hgb urine dipstick SMALL (*)    Bacteria, UA RARE (*)    All other components within normal limits  CBC WITH DIFFERENTIAL/PLATELET  LIPASE, BLOOD  PREGNANCY, URINE  TROPONIN I (HIGH SENSITIVITY)     EKG  ED ECG REPORT I, Eula Mazzola J, the attending physician, personally viewed and interpreted this ECG.   Date: 08/05/2022  EKG Time: 1844  Rate: 75  Rhythm: normal sinus rhythm  Axis: Normal  Intervals:none  ST&T Change: Nonspecific    RADIOLOGY Independently visualized and interpreted patient's CT scan as well as noted the radiology interpretation:  CT abdomen/pelvis: No acute abnormality  Official radiology report(s): CT Abdomen Pelvis W Contrast  Result Date: 08/05/2022 CLINICAL DATA:  Pancreatitis, acute, severe EXAM: CT ABDOMEN AND PELVIS WITH CONTRAST TECHNIQUE: Multidetector CT imaging of the abdomen and pelvis was performed using the standard protocol following bolus administration of intravenous contrast. RADIATION DOSE REDUCTION: This exam was performed according to the departmental dose-optimization program which includes automated exposure control, adjustment of the mA and/or kV according to patient size and/or use of iterative reconstruction technique. CONTRAST:  OMNIPAQUE IOHEXOL 300 MG/ML SOLN, OMNIPAQUE IOHEXOL 300 MG/ML SOLN COMPARISON:  CT abdomen pelvis 11/25/2021, CT abdomen pelvis 05/19/2022 FINDINGS: Lower chest: No acute abnormality. Hepatobiliary: No focal liver  abnormality. Status post cholecystectomy. No biliary dilatation. Pneumobilia noted. Pancreas: Diffusely atrophic. No focal lesion. Otherwise normal pancreatic contour. No surrounding inflammatory changes. No main pancreatic ductal dilatation. Spleen: Normal in size without focal abnormality. Adrenals/Urinary Tract: No adrenal nodule bilaterally. Bilateral kidneys enhance symmetrically. No hydronephrosis. No hydroureter. The urinary bladder is unremarkable. 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 Stomach is within normal limits. No evidence of bowel wall thickening or dilatation. Appendix appears normal. Vascular/Lymphatic: No abdominal aorta or iliac aneurysm. Mild atherosclerotic plaque of the aorta and its branches. No abdominal, pelvic, or inguinal lymphadenopathy. Reproductive: T-shaped intrauterine device in grossly appropriate position within the endometrium. Uterus and bilateral adnexa are unremarkable. Other: Trace nonspecific free fluid within the pelvis. No intraperitoneal free gas. No organized fluid collection. Musculoskeletal: No abdominal wall hernia or abnormality. No suspicious lytic or blastic osseous lesions. No acute displaced fracture. IMPRESSION: 1. No acute intra-abdominal or intrapelvic abnormality. 2. T-shaped intrauterine device in appropriate position. Electronically Signed   By: Tish Frederickson M.D.   On: 08/05/2022 23:33     PROCEDURES:  Critical Care performed: No  .1-3 Lead EKG Interpretation  Performed by: Irean Hong, MD Authorized by: Irean Hong, MD     Interpretation: normal     ECG rate:  75   ECG rate assessment: normal     Rhythm: sinus rhythm     Ectopy: none     Conduction: normal   Comments:     Patient placed on cardiac monitor to evaluate for arrhythmias    MEDICATIONS ORDERED IN ED: Medications  iohexol (OMNIPAQUE) 300 MG/ML solution 100 mL  (100 mLs Intravenous Contrast Given 08/05/22 2250)  iohexol (OMNIPAQUE) 300 MG/ML solution 100 mL (100 mLs Intravenous Contrast Given 08/05/22 2318)  sodium chloride 0.9 % bolus 1,000 mL (0 mLs Intravenous Stopped 08/06/22 0122)  ondansetron (ZOFRAN) injection 4 mg (4 mg Intravenous Given 08/05/22 2330)  HYDROmorphone (DILAUDID) injection  1 mg (1 mg Intravenous Given 08/05/22 2334)  ketorolac (TORADOL) 30 MG/ML injection 15 mg (15 mg Intravenous Given 08/06/22 0128)  oxyCODONE (Oxy IR/ROXICODONE) immediate release tablet 5 mg (5 mg Oral Given 08/06/22 0128)     IMPRESSION / MDM / ASSESSMENT AND PLAN / ED COURSE  I reviewed the triage vital signs and the nursing notes.                             51 year old female with a complicated history of necrotizing pancreatitis presenting with upper abdominal pain. Differential diagnosis includes, but is not limited to, biliary disease (biliary colic, acute cholecystitis, cholangitis, choledocholithiasis, etc), intrathoracic causes for epigastric abdominal pain including ACS, gastritis, duodenitis, pancreatitis, small bowel or large bowel obstruction, abdominal aortic aneurysm, hernia, and ulcer(s).  I personally reviewed patient's records and note her [facility] GI visit from 07/22/2022.  Patient's presentation is most consistent with acute presentation with potential threat to life or bodily function.  The patient is on the cardiac monitor to evaluate for evidence of arrhythmia and/or significant heart rate changes.  Laboratory results demonstrate normal WBC 5.9, normal electrolytes including lipase; UA with small hemoglobin.  Will initiate IV fluid resuscitation, IV Dilaudid for pain paired with IV Zofran for nausea.  Will obtain CT abdomen/pelvis to evaluate for acute intra-abdominal etiology of patient's symptoms.  Clinical Course as of 08/06/22 0516  Tue Aug 06, 2022  0038 Updated patient on CT scan results.  Pain improving.  Will add IV ketorolac and oxycodone.  Will  prescribe oxycodone to use as needed.  Patient has Zofran at home to use.  She will follow-up closely with her GI doctor.  Strict return precautions given.  Patient verbalizes understanding and agrees with plan of care. [JS]    Clinical Course User Index [JS] Irean Hong, MD     FINAL CLINICAL IMPRESSION(S) / ED DIAGNOSES   Final diagnoses:  Pain of upper abdomen  Chronic pancreatitis, unspecified pancreatitis type (HCC)     Rx / DC Orders   ED Discharge Orders          Ordered    oxyCODONE (ROXICODONE) 5 MG immediate release tablet  Every 6 hours PRN        08/06/22 0040             Note:  This document was prepared using Dragon voice recognition software and may include unintentional dictation errors.   Irean Hong, MD 08/06/22 [phone]

## 2022-08-05 NOTE — ED Triage Notes (Signed)
Patient brought in by [facility] for RUQ pain radiating into back x 3 days. Concerned for pancreatitis flare up.

## 2022-08-05 NOTE — ED Provider Triage Note (Signed)
  Emergency Medicine Provider Triage Evaluation Note  [facility] , a 51 y.o.female,  was evaluated in triage.  Pt complains of upper abdominal pain x 2 days.  She states that she has had episodes of pancreatitis in the past, and was recently told that she may need surgery.  However, she states that this pain is rating to the back and feels worse than her previous episodes.   Review of Systems  Positive: Abdominal pain Negative: Denies fever, chest pain, vomiting  Physical Exam  There were no vitals filed for this visit. Gen:   Awake, appears uncomfortable Resp:  Normal effort  MSK:   Moves extremities without difficulty  Other:    Medical Decision Making  Given the patient's initial medical screening exam, the following diagnostic evaluation has been ordered. The patient will be placed in the appropriate treatment space, once one is available, to complete the evaluation and treatment. I have discussed the plan of care with the patient and I have advised the patient that an ED physician or mid-level practitioner will reevaluate their condition after the test results have been received, as the results may give them additional insight into the type of treatment they may need.    Diagnostics: Labs, abdominal CT, UA  Treatments: none immediately   Varney Daily, [place] 08/05/22 1751

## 2022-08-06 MED ORDER — OXYCODONE HCL 5 MG PO TABS
5.0000 mg | ORAL_TABLET | Freq: Once | ORAL | Status: AC
  Administered 2022-08-06: 5 mg via ORAL
  Filled 2022-08-06: qty 1

## 2022-08-06 MED ORDER — OXYCODONE HCL 5 MG PO TABS: 5.0000 mg | ORAL_TABLET | Freq: Four times a day (QID) | ORAL | 0 refills | Status: AC | PRN

## 2022-08-06 MED ORDER — KETOROLAC TROMETHAMINE 30 MG/ML IJ SOLN
15.0000 mg | Freq: Once | INTRAMUSCULAR | Status: AC
  Administered 2022-08-06: 15 mg via INTRAVENOUS
  Filled 2022-08-06: qty 1

## 2022-08-06 NOTE — Discharge Instructions (Signed)
You may take oxycodone as needed for pain.  Return to the ER for worsening symptoms, persistent vomiting, difficulty breathing or other concerns.

## 2022-09-18 ENCOUNTER — Other Ambulatory Visit: Payer: Self-pay

## 2022-09-18 DIAGNOSIS — K861 Other chronic pancreatitis: Secondary | ICD-10-CM | POA: Insufficient documentation

## 2022-09-18 DIAGNOSIS — R1013 Epigastric pain: Secondary | ICD-10-CM | POA: Diagnosis present

## 2022-09-18 LAB — URINALYSIS, MICROSCOPIC (REFLEX): Bacteria, UA: NONE SEEN

## 2022-09-18 LAB — COMPREHENSIVE METABOLIC PANEL
ALT: 21 U/L (ref 0–44)
AST: 15 U/L (ref 15–41)
Albumin: 4.2 g/dL (ref 3.5–5.0)
Alkaline Phosphatase: 85 U/L (ref 38–126)
Anion gap: 10 (ref 5–15)
BUN: 16 mg/dL (ref 6–20)
CO2: 26 mmol/L (ref 22–32)
Calcium: 10 mg/dL (ref 8.9–10.3)
Chloride: 103 mmol/L (ref 98–111)
Creatinine, Ser: 1.02 mg/dL — ABNORMAL HIGH (ref 0.44–1.00)
GFR, Estimated: >60 mL/min (ref >60)
Glucose, Bld: 255 mg/dL — ABNORMAL HIGH (ref 70–99)
Potassium: 4.1 mmol/L (ref 3.5–5.1)
Sodium: 139 mmol/L (ref 135–145)
Total Bilirubin: 0.6 mg/dL (ref 0.3–1.2)
Total Protein: 7.6 g/dL (ref 6.5–8.1)

## 2022-09-18 LAB — URINALYSIS, ROUTINE W REFLEX MICROSCOPIC
Bilirubin Urine: NEGATIVE
Glucose, UA: >1000 mg/dL — AB
Ketones, ur: NEGATIVE mg/dL
Leukocytes,Ua: NEGATIVE
Nitrite: NEGATIVE
Protein, ur: NEGATIVE mg/dL
Specific Gravity, Urine: 1.01 (ref 1.005–1.030)
pH: 6 (ref 5.0–8.0)

## 2022-09-18 LAB — CBC
HCT: 38.9 % (ref 36.0–46.0)
Hemoglobin: 12.8 g/dL (ref 12.0–15.0)
MCH: 29 pg (ref 26.0–34.0)
MCHC: 32.9 g/dL (ref 30.0–36.0)
MCV: 88.2 fL (ref 80.0–100.0)
Platelets: 321 10*3/uL (ref 150–400)
RBC: 4.41 MIL/uL (ref 3.87–5.11)
RDW: 13.4 % (ref 11.5–15.5)
WBC: 6.3 10*3/uL (ref 4.0–10.5)
nRBC: 0 % (ref 0.0–0.2)

## 2022-09-18 LAB — LIPASE, BLOOD: Lipase: 24 U/L (ref 11–51)

## 2022-09-18 LAB — POC URINE PREG, ED: Preg Test, Ur: NEGATIVE

## 2022-09-18 NOTE — ED Triage Notes (Signed)
Pt to ED via [facility], see first nurse note. Pt complains of LUQ abdominal pain for 3 days, hx chronic pancreatitis, recently told by surgeons will need tail of pancreas removed. Pt also complains of nausea.   Pt states is tired of being sick. Pt has glucose monitor to upper R arm. 20# IV to L AC.   CBG was 300 per EMS, pt received 549mL NS and zofran by EMS.  Pt says is here basically for pain control and to see "if anything can be done". Pt takes prescribed PRN tramadol at home for pain.  Extensive medical history, intubated twice, 5 prior surgeries to pancreas.

## 2022-09-18 NOTE — ED Triage Notes (Signed)
Arrives via [facility].  C/O LUQ abd pain x 3 days. Has history of pancreatitis.  Per report, patient informed EMS that she had met with a surgeon today and was told that she needs surgery on her pancreas.  CBG:  300.  20 g LAC.  4 mg Zofran. 500 NS given by EMS..  Patient had been c/o nausea.  VS wnl.

## 2022-09-18 NOTE — ED Notes (Signed)
[employer] not working, chart labels used.

## 2022-09-19 ENCOUNTER — Encounter: Payer: Self-pay | Admitting: Radiology

## 2022-09-19 ENCOUNTER — Emergency Department
Admission: EM | Admit: 2022-09-19 | Discharge: 2022-09-19 | Disposition: A | Discharge status: Home / Self Care | Payer: Medicaid Other | Attending: Emergency Medicine | Admitting: Emergency Medicine

## 2022-09-19 ENCOUNTER — Emergency Department: Payer: Medicaid Other

## 2022-09-19 DIAGNOSIS — K861 Other chronic pancreatitis: Secondary | ICD-10-CM

## 2022-09-19 DIAGNOSIS — G8929 Other chronic pain: Secondary | ICD-10-CM

## 2022-09-19 HISTORY — DX: Calculus of gallbladder without cholecystitis without obstruction: K80.20

## 2022-09-19 MED ORDER — ONDANSETRON 4 MG PO TBDP: 4.0000 mg | ORAL_TABLET | Freq: Three times a day (TID) | ORAL | 0 refills | Status: AC | PRN

## 2022-09-19 MED ORDER — IOHEXOL 300 MG/ML  SOLN
100.0000 mL | Freq: Once | INTRAMUSCULAR | Status: AC | PRN
  Administered 2022-09-19: 100 mL via INTRAVENOUS

## 2022-09-19 MED ORDER — ONDANSETRON HCL 4 MG/2ML IJ SOLN
4.0000 mg | Freq: Once | INTRAMUSCULAR | Status: AC
  Administered 2022-09-19: 4 mg via INTRAVENOUS
  Filled 2022-09-19: qty 2

## 2022-09-19 MED ORDER — ACETAMINOPHEN 500 MG PO TABS
1000.0000 mg | ORAL_TABLET | Freq: Once | ORAL | Status: AC
  Administered 2022-09-19: 1000 mg via ORAL
  Filled 2022-09-19: qty 2

## 2022-09-19 MED ORDER — LACTATED RINGERS IV BOLUS
1000.0000 mL | Freq: Once | INTRAVENOUS | Status: AC
  Administered 2022-09-19: 1000 mL via INTRAVENOUS

## 2022-09-19 MED ORDER — MORPHINE SULFATE (PF) 4 MG/ML IV SOLN
4.0000 mg | Freq: Once | INTRAVENOUS | Status: AC
  Administered 2022-09-19: 4 mg via INTRAVENOUS
  Filled 2022-09-19: qty 1

## 2022-09-19 MED ORDER — OXYCODONE HCL 5 MG PO TABS: 5.0000 mg | ORAL_TABLET | Freq: Three times a day (TID) | ORAL | 0 refills | Status: AC | PRN

## 2022-09-19 MED ORDER — OXYCODONE HCL 5 MG PO TABS
5.0000 mg | ORAL_TABLET | Freq: Once | ORAL | Status: AC
  Administered 2022-09-19: 5 mg via ORAL
  Filled 2022-09-19: qty 1

## 2022-09-19 NOTE — ED Provider Notes (Signed)
[facility] Provider Note    Event Date/Time   First MD Initiated Contact with Patient 09/19/22 [phone]     (approximate)   History   Abdominal Pain   HPI  Sierra Wiley is a 51 y.o. female who presents to the ED for evaluation of Abdominal Pain   I review a [facility] pancreatic surgery clinic visit from just yesterday due to necrotizing pancreatitis.  She is s/p cholecystectomy.  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  She reports this is consistent with previous episode of pancreatitis and reports discussed concern for her pancreas.  Nausea without emesis.   Physical Exam   Triage Vital Signs: ED Triage Vitals  Enc Vitals Group     BP 09/18/22 1641 130/75     Pulse Rate 09/18/22 1641 77     Resp 09/18/22 1641 18     Temp 09/18/22 1641 98.1 F (36.7 C)     Temp Source 09/18/22 1641 Oral     SpO2 09/18/22 1641 100 %     Weight 09/18/22 1644 199 lb (90.3 kg)     Height 09/18/22 1644 5\' 4"  (1.626 m)     Head Circumference --      Peak Flow --      Pain Score 09/18/22 1643 9     Pain Loc --      Pain Edu? --      Excl. in GC? --     Most recent vital signs: Vitals:   09/19/22 0430 09/19/22 0510  BP: 112/69 108/72  Pulse: 86 79  Resp:  16  Temp:  98.1 F (36.7 C)  SpO2: 95% 98%    General: Awake, no distress.  Conversational.  Looks well. CV:  Good peripheral perfusion.  Resp:  Normal effort.  Abd:  No distention.  Mild and poorly localizing periumbilical, left-sided and LUQ tenderness to palpation without peritoneal features.  Lower abdomen is benign. MSK:  No deformity noted.  Neuro:  No focal deficits appreciated. Other:     ED Results / Procedures / Treatments   Labs (all labs ordered are listed, but only abnormal results are displayed) Labs Reviewed  COMPREHENSIVE METABOLIC PANEL - Abnormal;  Notable for the following components:      Result Value   Glucose, Bld 255 (*)    Creatinine, Ser 1.02 (*)    All other components within normal limits  URINALYSIS, ROUTINE W REFLEX MICROSCOPIC - Abnormal; Notable for the following components:   Glucose, UA >1,000 (*)    Hgb urine dipstick TRACE (*)    All other components within normal limits  LIPASE, BLOOD  CBC  URINALYSIS, MICROSCOPIC (REFLEX)  POC URINE PREG, ED    EKG Sinus rhythm with rate of 76 bpm.  Normal axis and intervals.  Nonspecific ST changes inferiorly without clear ischemic features. Comparison from August with a fairly similar morphology.  RADIOLOGY CT abdomen/pelvis interpreted by me without evidence of SBO  Official radiology report(s): CT ABDOMEN PELVIS W CONTRAST  Result Date: 09/19/2022 CLINICAL DATA:  History of pancreatitis, presenting with left upper quadrant abdominal pain x3 days. EXAM: CT ABDOMEN AND PELVIS WITH CONTRAST TECHNIQUE: Multidetector CT imaging of the abdomen and pelvis was performed using the standard protocol following bolus administration of intravenous contrast. RADIATION DOSE REDUCTION: This exam was performed according to the departmental dose-optimization program which includes automated  exposure control, adjustment of the mA and/or kV according to patient size and/or use of iterative reconstruction technique. CONTRAST:  170mL OMNIPAQUE IOHEXOL 300 MG/ML  SOLN COMPARISON:  August 05, 2022 FINDINGS: Lower chest: No acute abnormality. Hepatobiliary: No focal liver abnormality is seen. Status post cholecystectomy. Stable, mild-to-moderate severity pneumobilia is noted. Pancreas: Diffuse atrophy of the pancreatic parenchyma is seen. There is no evidence of pancreatic ductal dilatation. Spleen: Normal in size without focal abnormality. Adrenals/Urinary Tract: Adrenal glands are unremarkable. Kidneys are normal, without renal calculi, focal lesion, or hydronephrosis. Bladder is unremarkable.  Stomach/Bowel: Stomach is within normal limits. The stent seen within the gastric antrum lumen on the prior study is no longer present. Appendix appears normal. No evidence of bowel wall thickening, distention, or inflammatory changes. Vascular/Lymphatic: No significant vascular findings are present. No enlarged abdominal or pelvic lymph nodes. Reproductive: In IUD is properly positioned within an otherwise normal appearing uterus. The bilateral adnexa are unremarkable. Other: No abdominal wall hernia or abnormality. A small amount of posterior pelvic free fluid is seen. Musculoskeletal: No acute or significant osseous findings. IMPRESSION: 1. Status post cholecystectomy with stable, mild-to-moderate severity pneumobilia. 2. Small amount of posterior pelvic free fluid, likely physiologic. 3. No acute or active process within the abdomen or pelvis. 4. Interval removal of the gastric antrum stent seen on the prior exam. Electronically Signed   By: Virgina Norfolk M.D.   On: 09/19/2022 01:52    PROCEDURES and INTERVENTIONS:  Procedures  Medications  lactated ringers bolus 1,000 mL (0 mLs Intravenous Stopped 09/19/22 0509)  morphine (PF) 4 MG/ML injection 4 mg (4 mg Intravenous Given 09/19/22 0145)  ondansetron (ZOFRAN) injection 4 mg (4 mg Intravenous Given 09/19/22 0145)  iohexol (OMNIPAQUE) 300 MG/ML solution 100 mL (100 mLs Intravenous Contrast Given 09/19/22 0129)  acetaminophen (TYLENOL) tablet 1,000 mg (1,000 mg Oral Given 09/19/22 0328)  oxyCODONE (Oxy IR/ROXICODONE) immediate release tablet 5 mg (5 mg Oral Given 09/19/22 0328)     IMPRESSION / MDM / ASSESSMENT AND PLAN / ED COURSE  I reviewed the triage vital signs and the nursing notes.  Differential diagnosis includes, but is not limited to, SBO, gastric ulcers, GI bleed, pancreatitis, choledocholithiasis  {Patient presents with symptoms of an acute illness or injury that is potentially life-threatening.  51 year old female with a history  of chronic pancreatitis presents to the ED with acute on chronic pain, ultimately suitable for outpatient management.  Looks systemically well.  Mild tenderness on exam without peritoneal features.  Normal lipase.  Normal CBC and CMP.  No signs of sepsis.  Noted be hyperglycemic without DKA.  CT without radiographic evidence of acute pathology or active inflammation.  Pain is controlled and she is tolerating p.o. intake.  She is suitable for outpatient management.  Clinical Course as of 09/19/22 0643  Thu Sep 19, 2022  0223 Reassessed.  Patient reports feeling okay.  We discussed fairly reassuring work-up without evidence of pancreatitis radiographically or from serum assessments.  We discussed the possibility of outpatient management and she is agreeable. [DS]    Clinical Course User Index [DS] Vladimir Crofts, MD     FINAL CLINICAL IMPRESSION(S) / ED DIAGNOSES   Final diagnoses:  Abdominal pain, chronic, epigastric  Chronic pancreatitis, unspecified pancreatitis type ([place])     Rx / DC Orders   ED Discharge Orders          Ordered    ondansetron (ZOFRAN-ODT) 4 MG disintegrating tablet  Every 8 hours PRN  09/19/22 0314    oxyCODONE (ROXICODONE) 5 MG immediate release tablet  Every 8 hours PRN        09/19/22 0314             Note:  This document was prepared using Dragon voice recognition software and may include unintentional dictation errors.   Delton Pra[place], MD 09/19/22 [phone]

## 2022-09-19 NOTE — Discharge Instructions (Signed)
Use Tylenol for pain and fevers.  Up to 1000 mg per dose, up to 4 times per day.  Do not take more than 4000 mg of Tylenol/acetaminophen within 24 hours..  Use oxycodone as needed for more severe/breakthrough pain.  Use Zofran as needed for nausea and vomiting.

## 2022-12-02 IMAGING — CT CT ABD-PELV W/ CM
2 of 5 series · 13 of 46 positions shown, 15 images · IV contrast (agent unspecified)
Comparison: November 25, 2021
COMPARISON: November 25, 2021

Addendum:
CLINICAL DATA: Generalized abdominal pain.

EXAM:
CT ABDOMEN AND PELVIS WITH CONTRAST
TECHNIQUE: Multidetector CT imaging of the abdomen and pelvis was performed
using the standard protocol following bolus administration of
intravenous contrast.

[Series 2: routine abd/pel with · axial · 0.83mm/px · z∈[-414,+6]mm · 10 of 94 slices shown, 12 images]
[im 5/94  soft-tissue]
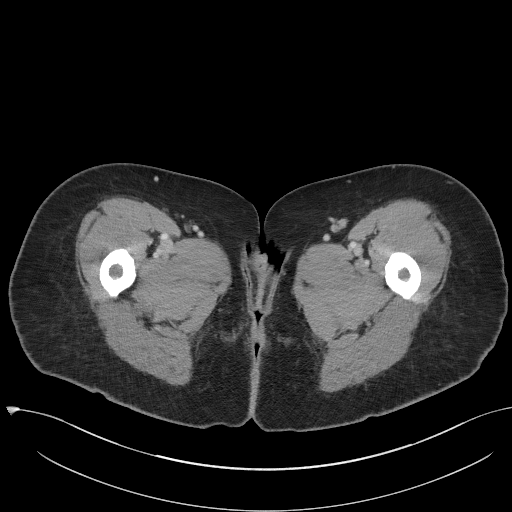
[im 5/94  bone]
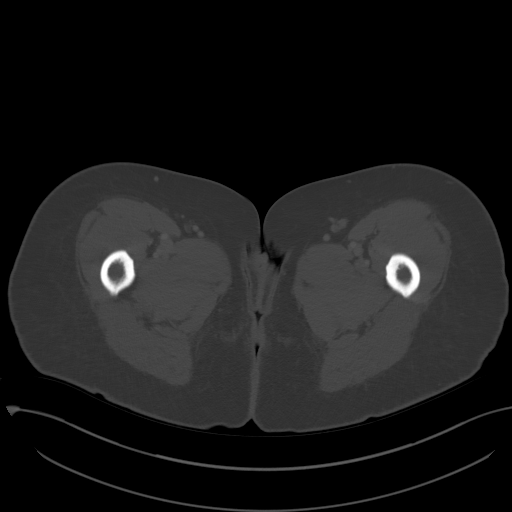
[im 15/94  soft-tissue]
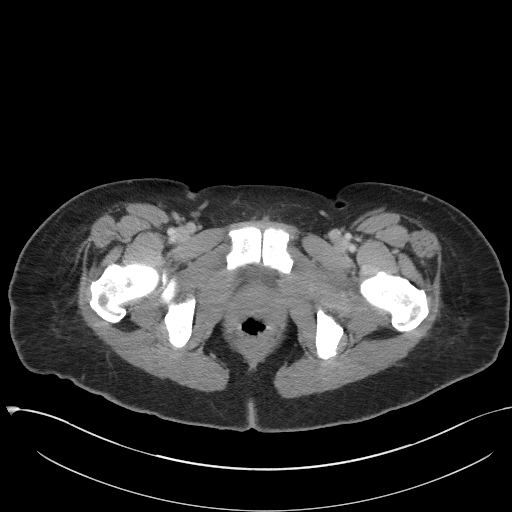
[im 25/94  soft-tissue]
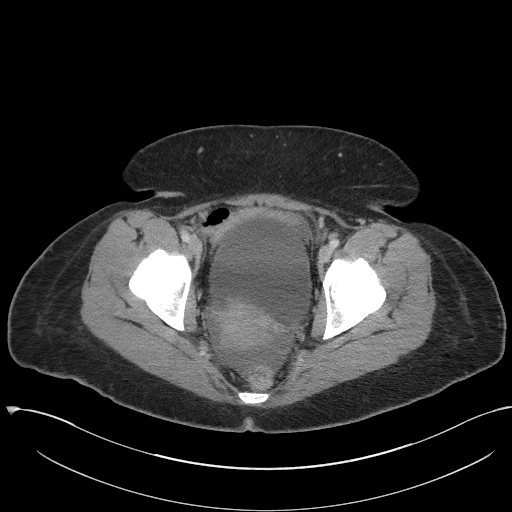
[im 35/94  soft-tissue]
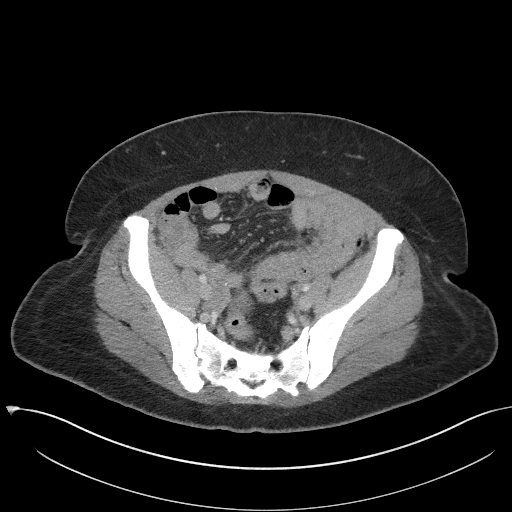
[im 45/94  soft-tissue]
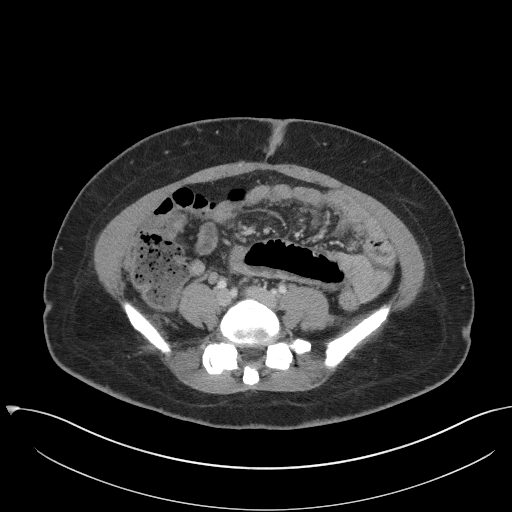
[im 49/94  soft-tissue]
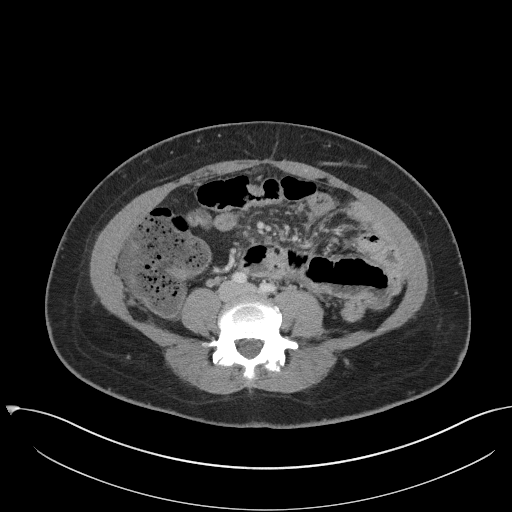
[im 59/94  soft-tissue]
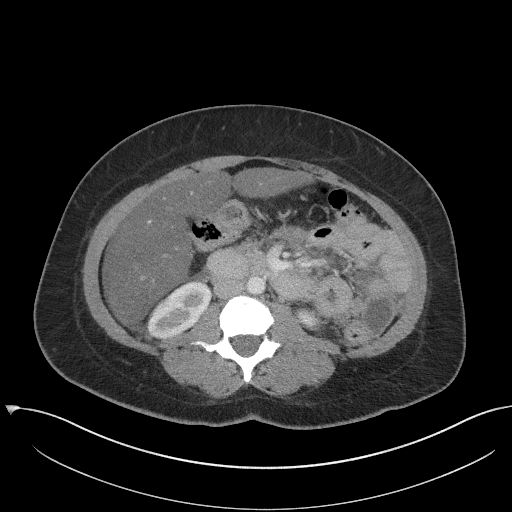
[im 69/94  soft-tissue]
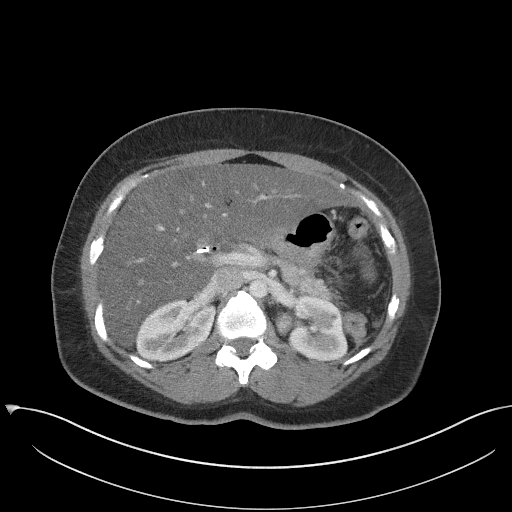
[im 79/94  soft-tissue]
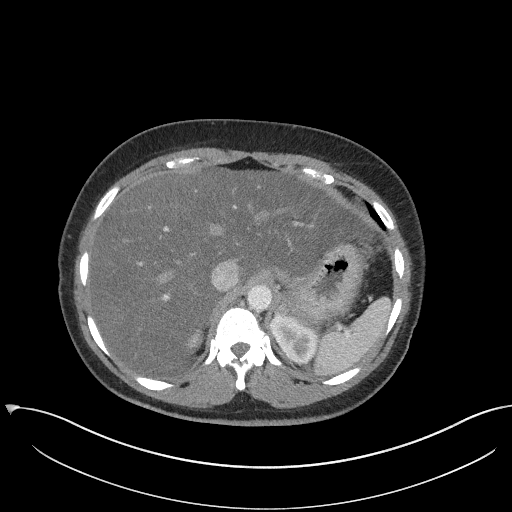
[im 79/94  bone]
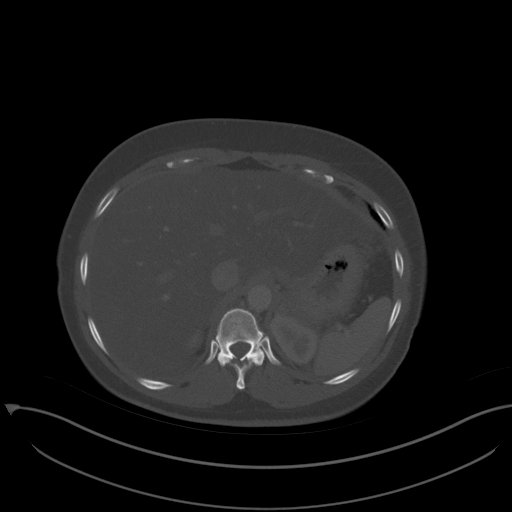
[im 89/94  soft-tissue]
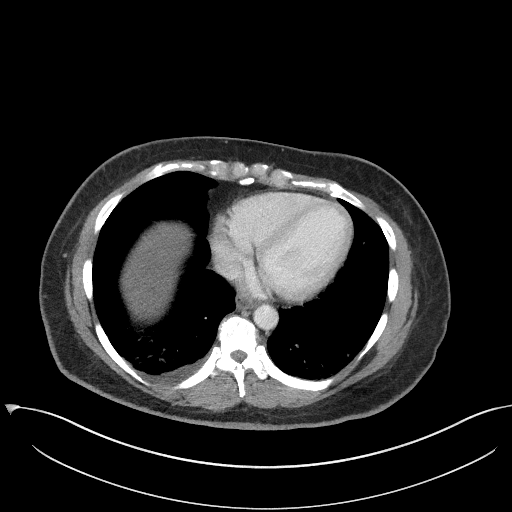

[Series 5: coronal st · coronal · 0.83mm/px · 3 of 82 slices shown]
[im 28/82  soft-tissue]
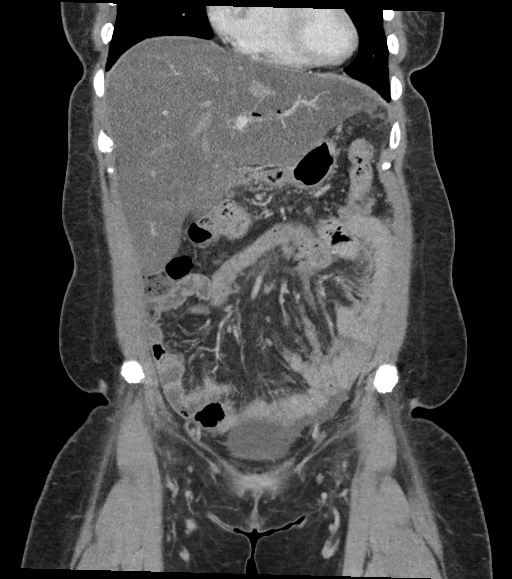
[im 37/82  soft-tissue]
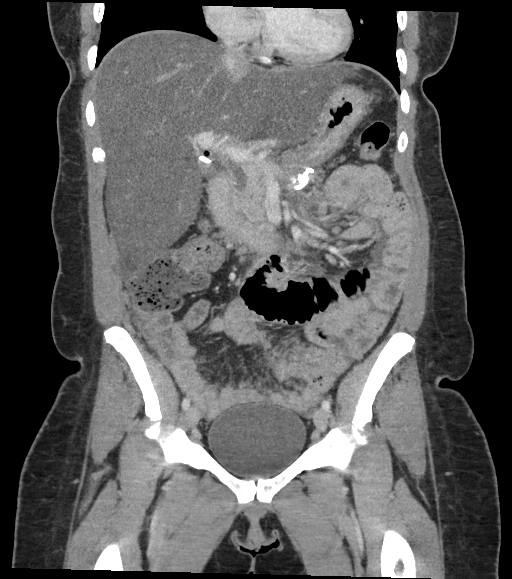
[im 46/82  soft-tissue]
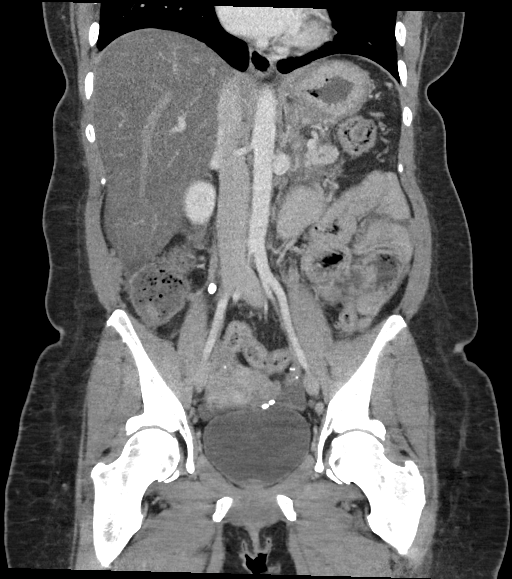

[13 of 46 positions shown; findings below may reference images not displayed]

RADIATION DOSE REDUCTION: This exam was performed according to the
departmental dose-optimization program which includes automated
exposure control, adjustment of the mA and/or kV according to
patient size and/or use of iterative reconstruction technique.

CONTRAST:  100mL OMNIPAQUE IOHEXOL 300 MG/ML  SOLN
FINDINGS: Lower chest: Mild atelectasis is seen within the bilateral lung
bases, with a small right pleural effusion.

Hepatobiliary: Extensive diffuse fatty infiltration of the liver
parenchyma is seen. A small amount of pneumobilia is noted within
the left lobe. No focal liver abnormality is seen. Status post
cholecystectomy. The common bile duct measures 7.6 mm in diameter.

Pancreas: There is evidence of prior post transgastric cyst
gastrostomy drainage of pancreatic fluid collections with two stents
in place. These are stable in position. Mild peripancreatic
inflammatory fat stranding is seen

Spleen: Normal in size without focal abnormality.

Adrenals/Urinary Tract: Adrenal glands are unremarkable. Kidneys are
normal, without renal calculi, focal lesion, or hydronephrosis.
Bladder is unremarkable.

Stomach/Bowel: Stomach is within normal limits. The appendix is not
clearly identified. Dilated loops of air and fluid-filled small
bowel are seen within the posterior aspect of the mid and lower left
abdomen (maximum small bowel diameter of approximately 3.0 cm). A
gradual transition zone is suspected within the lateral aspect of
the mid left abdomen (axial CT images 29 through 34, CT series 2).

Vascular/Lymphatic: No significant vascular findings are present.
Numerous tiny mesenteric lymph nodes are seen scattered throughout
the abdomen.

Reproductive: An IUD is in place. The uterus and bilateral adnexa
are otherwise unremarkable.

Other: No abdominal wall hernia or abnormality. A small amount of
perihepatic fluid is seen along the inferior tip of the right lobe
of the liver. A small amount of posterior pelvic free fluid is also
seen.

Stable peripheral mesenteric soft tissue nodules are seen within the
mid and lower right abdomen.

Musculoskeletal: No acute or significant osseous findings.
IMPRESSION: 1. Mild to moderate severity acute pancreatitis with evidence of
prior post transgastric cyst gastrostomy drainage and stent
placement.
2. Additional findings consistent with a partial small bowel
obstruction versus ileus.
3. Small amount of ascites.
4. Hepatic steatosis with evidence of prior cholecystectomy and mild
pneumobilia.
5. Small right pleural effusion.
6. Stable peripheral mesenteric soft tissue nodules within the mid
and lower right abdomen. Sequelae associated with an underlying
neoplastic process and/or metastatic disease cannot be excluded.

*** End of Addendum ***
RADIATION DOSE REDUCTION: This exam was performed according to the
departmental dose-optimization program which includes automated
exposure control, adjustment of the mA and/or kV according to
patient size and/or use of iterative reconstruction technique.

CONTRAST:  100mL OMNIPAQUE IOHEXOL 300 MG/ML  SOLN
FINDINGS: Lower chest: Mild atelectasis is seen within the bilateral lung
bases, with a small right pleural effusion.

Hepatobiliary: Extensive diffuse fatty infiltration of the liver
parenchyma is seen. A small amount of pneumobilia is noted within
the left lobe. No focal liver abnormality is seen. Status post
cholecystectomy. The common bile duct measures 7.6 mm in diameter.

Pancreas: There is evidence of prior post transgastric cyst
gastrostomy drainage of pancreatic fluid collections with two stents
in place. These are stable in position. Mild peripancreatic
inflammatory fat stranding is seen

Spleen: Normal in size without focal abnormality.

Adrenals/Urinary Tract: Adrenal glands are unremarkable. Kidneys are
normal, without renal calculi, focal lesion, or hydronephrosis.
Bladder is unremarkable.

Stomach/Bowel: Stomach is within normal limits. The appendix is not
clearly identified. Dilated loops of air and fluid-filled small
bowel are seen within the posterior aspect of the mid and lower left
abdomen (maximum small bowel diameter of approximately 3.0 cm). A
gradual transition zone is suspected within the lateral aspect of
the mid left abdomen (axial CT images 29 through 34, CT series 2).

Vascular/Lymphatic: No significant vascular findings are present.
Numerous tiny mesenteric lymph nodes are seen scattered throughout
the abdomen.

Reproductive: An IUD is in place. The uterus and bilateral adnexa
are otherwise unremarkable.

Other: No abdominal wall hernia or abnormality. A small amount of
perihepatic fluid is seen along the inferior tip of the right lobe
of the liver. A small amount of posterior pelvic free fluid is also
seen.

Stable peripheral mesenteric soft tissue nodules are seen within the
mid and lower right abdomen.

Musculoskeletal: No acute or significant osseous findings.
IMPRESSION: 1. Mild to moderate severity acute pancreatitis with evidence of
prior post transgastric cyst gastrostomy drainage and stent
placement.
2. Additional findings consistent with a partial small bowel
obstruction versus ileus.
3. Small amount of ascites.
4. Hepatic steatosis with evidence of prior cholecystectomy and mild
pneumobilia.
5. Small right pleural effusion.
6. Stable peripheral mesenteric soft tissue nodules within the mid
and lower right abdomen. Sequelae associated with an underlying
neoplastic process and/or metastatic disease cannot be excluded.

## 2022-12-18 ENCOUNTER — Other Ambulatory Visit: Payer: Self-pay

## 2022-12-18 DIAGNOSIS — R109 Unspecified abdominal pain: Secondary | ICD-10-CM | POA: Insufficient documentation

## 2022-12-18 DIAGNOSIS — Z5321 Procedure and treatment not carried out due to patient leaving prior to being seen by health care provider: Secondary | ICD-10-CM | POA: Insufficient documentation

## 2022-12-18 LAB — URINALYSIS, ROUTINE W REFLEX MICROSCOPIC
Bacteria, UA: NONE SEEN
Bilirubin Urine: NEGATIVE
Glucose, UA: NEGATIVE mg/dL
Ketones, ur: NEGATIVE mg/dL
Leukocytes,Ua: NEGATIVE
Nitrite: NEGATIVE
Protein, ur: 30 mg/dL — AB
Specific Gravity, Urine: 1.033 — ABNORMAL HIGH (ref 1.005–1.030)
pH: 5 (ref 5.0–8.0)

## 2022-12-18 LAB — COMPREHENSIVE METABOLIC PANEL
ALT: 14 U/L (ref 0–44)
AST: 14 U/L — ABNORMAL LOW (ref 15–41)
Albumin: 4.4 g/dL (ref 3.5–5.0)
Alkaline Phosphatase: 66 U/L (ref 38–126)
Anion gap: 8 (ref 5–15)
BUN: 12 mg/dL (ref 6–20)
CO2: 25 mmol/L (ref 22–32)
Calcium: 9.4 mg/dL (ref 8.9–10.3)
Chloride: 106 mmol/L (ref 98–111)
Creatinine, Ser: 0.83 mg/dL (ref 0.44–1.00)
GFR, Estimated: >60 mL/min (ref >60)
Glucose, Bld: 137 mg/dL — ABNORMAL HIGH (ref 70–99)
Potassium: 3.5 mmol/L (ref 3.5–5.1)
Sodium: 139 mmol/L (ref 135–145)
Total Bilirubin: 0.8 mg/dL (ref 0.3–1.2)
Total Protein: 7.9 g/dL (ref 6.5–8.1)

## 2022-12-18 LAB — CBC
HCT: 34.5 % — ABNORMAL LOW (ref 36.0–46.0)
Hemoglobin: 10.9 g/dL — ABNORMAL LOW (ref 12.0–15.0)
MCH: 28.8 pg (ref 26.0–34.0)
MCHC: 31.6 g/dL (ref 30.0–36.0)
MCV: 91.3 fL (ref 80.0–100.0)
Platelets: 694 K/uL — ABNORMAL HIGH (ref 150–400)
RBC: 3.78 MIL/uL — ABNORMAL LOW (ref 3.87–5.11)
RDW: 15 % (ref 11.5–15.5)
WBC: 7.3 K/uL (ref 4.0–10.5)
nRBC: 0 % (ref 0.0–0.2)

## 2022-12-18 LAB — PREGNANCY, URINE: Preg Test, Ur: NEGATIVE

## 2022-12-18 LAB — LIPASE, BLOOD: Lipase: 26 U/L (ref 11–51)

## 2022-12-18 NOTE — ED Triage Notes (Signed)
Pt comes via EMS from home with c/o belly pain. Pt states tightness. Pt did have surgery for pancreas and spleen Dec 5th.   VSS per EMS

## 2022-12-19 ENCOUNTER — Emergency Department
Admission: EM | Admit: 2022-12-19 | Discharge: 2022-12-19 | Discharge status: Against Medical Advice | Payer: Medicaid Other | Attending: Emergency Medicine | Admitting: Emergency Medicine

## 2022-12-19 NOTE — ED Notes (Signed)
No answer when called several times from lobby 

## 2023-01-22 ENCOUNTER — Emergency Department
Admission: EM | Admit: 2023-01-22 | Discharge: 2023-01-22 | Disposition: A | Discharge status: Home / Self Care | Payer: Medicaid Other | Attending: Emergency Medicine | Admitting: Emergency Medicine

## 2023-01-22 ENCOUNTER — Other Ambulatory Visit: Payer: Self-pay

## 2023-01-22 ENCOUNTER — Emergency Department: Payer: Medicaid Other

## 2023-01-22 DIAGNOSIS — R1013 Epigastric pain: Secondary | ICD-10-CM | POA: Diagnosis not present

## 2023-01-22 LAB — URINALYSIS, ROUTINE W REFLEX MICROSCOPIC
Bacteria, UA: NONE SEEN
Bilirubin Urine: NEGATIVE
Glucose, UA: NEGATIVE mg/dL
Ketones, ur: NEGATIVE mg/dL
Leukocytes,Ua: NEGATIVE
Nitrite: NEGATIVE
Protein, ur: NEGATIVE mg/dL
Specific Gravity, Urine: 1.021 (ref 1.005–1.030)
pH: 5 (ref 5.0–8.0)

## 2023-01-22 LAB — COMPREHENSIVE METABOLIC PANEL
ALT: 10 U/L (ref 0–44)
AST: 17 U/L (ref 15–41)
Albumin: 4.1 g/dL (ref 3.5–5.0)
Alkaline Phosphatase: 78 U/L (ref 38–126)
Anion gap: 11 (ref 5–15)
BUN: 9 mg/dL (ref 6–20)
CO2: 20 mmol/L — ABNORMAL LOW (ref 22–32)
Calcium: 9.4 mg/dL (ref 8.9–10.3)
Chloride: 104 mmol/L (ref 98–111)
Creatinine, Ser: 0.67 mg/dL (ref 0.44–1.00)
GFR, Estimated: >60 mL/min (ref >60)
Glucose, Bld: 196 mg/dL — ABNORMAL HIGH (ref 70–99)
Potassium: 3.4 mmol/L — ABNORMAL LOW (ref 3.5–5.1)
Sodium: 135 mmol/L (ref 135–145)
Total Bilirubin: 0.8 mg/dL (ref 0.3–1.2)
Total Protein: 7.4 g/dL (ref 6.5–8.1)

## 2023-01-22 LAB — LIPASE, BLOOD: Lipase: 24 U/L (ref 11–51)

## 2023-01-22 LAB — CBC
HCT: 37.1 % (ref 36.0–46.0)
Hemoglobin: 12.1 g/dL (ref 12.0–15.0)
MCH: 29.2 pg (ref 26.0–34.0)
MCHC: 32.6 g/dL (ref 30.0–36.0)
MCV: 89.4 fL (ref 80.0–100.0)
Platelets: 438 10*3/uL — ABNORMAL HIGH (ref 150–400)
RBC: 4.15 MIL/uL (ref 3.87–5.11)
RDW: 14.3 % (ref 11.5–15.5)
WBC: 5.4 10*3/uL (ref 4.0–10.5)
nRBC: 0 % (ref 0.0–0.2)

## 2023-01-22 LAB — POC URINE PREG, ED: Preg Test, Ur: NEGATIVE

## 2023-01-22 MED ORDER — IOHEXOL 300 MG/ML  SOLN
100.0000 mL | Freq: Once | INTRAMUSCULAR | Status: AC | PRN
  Administered 2023-01-22: 100 mL via INTRAVENOUS

## 2023-01-22 NOTE — ED Provider Notes (Signed)
[facility] Provider Note    Event Date/Time   First MD Initiated Contact with Patient 01/22/23 1501     (approximate)   History   Abdominal Pain   HPI  Sierra Wiley is a 52 y.o. 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  Patient reports this started this morning.  Reports normal stools.  No fevers.     Physical Exam   Triage Vital Signs: ED Triage Vitals  Enc Vitals Group     BP 01/22/23 1135 (!) 153/84     Pulse Rate 01/22/23 1135 71     Resp 01/22/23 1135 16     Temp 01/22/23 1135 99.1 F (37.3 C)     Temp Source 01/22/23 1135 Oral     SpO2 01/22/23 1135 99 %     Weight 01/22/23 1133 90.3 kg (199 lb 1.2 oz)     Height 01/22/23 1133 1.626 m (5\' 4" )     Head Circumference --      Peak Flow --      Pain Score 01/22/23 1133 8     Pain Loc --      Pain Edu? --      Excl. in [place]? --     Most recent vital signs: Vitals:   01/22/23 1135 01/22/23 1640  BP: (!) 153/84 (!) 149/83  Pulse: 71 69  Resp: 16 18  Temp: 99.1 F (37.3 C)   SpO2: 99% 100%     General: Awake, no distress.  CV:  Good peripheral perfusion.  Resp:  Normal effort.  Abd:  No distention.  Mild epigastric tenderness Other:     ED Results / Procedures / Treatments   Labs (all labs ordered are listed, but only abnormal results are displayed) Labs Reviewed  COMPREHENSIVE METABOLIC PANEL - Abnormal; Notable for the following components:      Result Value   Potassium 3.4 (*)    CO2 20 (*)    Glucose, Bld 196 (*)    All other components within normal limits  CBC - Abnormal; Notable for the following components:   Platelets 438 (*)    All other components within normal limits  URINALYSIS, ROUTINE W REFLEX MICROSCOPIC - Abnormal; Notable for the following components:   Color, Urine YELLOW (*)    APPearance CLEAR (*)    Hgb urine dipstick  MODERATE (*)    All other components within normal limits  LIPASE, BLOOD  POC URINE PREG, ED     EKG     RADIOLOGY CT scan without acute abnormalities    PROCEDURES:  Critical Care performed:   Procedures   MEDICATIONS ORDERED IN ED: Medications  iohexol (OMNIPAQUE) 300 MG/ML solution 100 mL (100 mLs Intravenous Contrast Given 01/22/23 1557)     IMPRESSION / MDM / ASSESSMENT AND PLAN / ED COURSE  I reviewed the triage vital signs and the nursing notes. Patient's presentation is most consistent with acute presentation with potential threat to life or bodily function.   Patient with recent surgery as detailed above, [facility] records reviewed, patient had admission for hyperglycemia on January 3  Differential is extensive, discomfort only started today overall the patient is not in significant distress, differential includes gastritis but given history she will need imaging, lab work reviewed and is overall unremarkable  CT scan is reassuring, reevaluate the patient, she is well-appearing and feeling better, appropriate for  discharge at this time, will have her follow-up closely with her team at [facility] IMPRESSION(S) / ED DIAGNOSES   Final diagnoses:  Epigastric pain     Rx / DC Orders   ED Discharge Orders     None        Note:  This document was prepared using Dragon voice recognition software and may include unintentional dictation errors.   Lavonia Drafts, MD 01/22/23 [phone]

## 2023-01-22 NOTE — ED Triage Notes (Signed)
First Nurse Note:  Pt via EMS from home. Pt c/o LUQ abd pain. Pt had her pancreas and spleen taken out 12/5. Pt also has a hx of cholecystectomy. Denies NVD. Pt is A&Ox4 and NAD 160/90 228 CBG  12 lead unremarkable per EMS.

## 2023-05-19 ENCOUNTER — Other Ambulatory Visit: Payer: Self-pay

## 2023-05-19 ENCOUNTER — Emergency Department
Admission: EM | Admit: 2023-05-19 | Discharge: 2023-05-19 | Disposition: A | Discharge status: Home / Self Care | Payer: Medicaid Other | Attending: Emergency Medicine | Admitting: Emergency Medicine

## 2023-05-19 DIAGNOSIS — M545 Low back pain, unspecified: Secondary | ICD-10-CM | POA: Diagnosis present

## 2023-05-19 MED ORDER — METHOCARBAMOL 500 MG PO TABS: 500.0000 mg | ORAL_TABLET | Freq: Three times a day (TID) | ORAL | 0 refills | Status: AC | PRN

## 2023-05-19 MED ORDER — HYDROCODONE-ACETAMINOPHEN 5-325 MG PO TABS
1.0000 | ORAL_TABLET | Freq: Once | ORAL | Status: AC
  Administered 2023-05-19: 1 via ORAL
  Filled 2023-05-19: qty 1

## 2023-05-19 MED ORDER — ONDANSETRON 4 MG PO TBDP
4.0000 mg | ORAL_TABLET | Freq: Once | ORAL | Status: AC
  Administered 2023-05-19: 4 mg via ORAL
  Filled 2023-05-19: qty 1

## 2023-05-19 MED ORDER — ALBUTEROL SULFATE HFA 108 (90 BASE) MCG/ACT IN AERS: 2.0000 | INHALATION_SPRAY | Freq: Four times a day (QID) | RESPIRATORY_TRACT | 2 refills | Status: AC | PRN

## 2023-05-19 NOTE — ED Triage Notes (Signed)
Pt here with back pain since yesterday. Pt states she has hx of DDD and did some hair yesterday and was standing for quite a while and thinks she aggravated it. Pt still able to ambulate. Pt has hx of pancreatitis.

## 2023-05-19 NOTE — ED Provider Triage Note (Signed)
Emergency Medicine Provider Triage Evaluation Note  Sierra Wiley, a 52 y.o. female  was evaluated in triage.  Pt complains of acute on chronic LBP due to underlying DDD. She notes onset after standing for a prolonged period doing hair. She denies bladder/bowel incontinence.  Review of Systems  Positive: LBP Negative: Distal paresthesias  Physical Exam  BP (!) 141/101   Pulse 78   Temp 98.3 F (36.8 C) (Oral)   Resp 18   Ht 5\' 4"  (1.626 m)   Wt 90.3 kg   SpO2 98%   BMI 34.17 kg/m  Gen:   Awake, no distress  NAD Resp:  Normal effort CTA MSK:   Moves extremities without difficulty  Other:    Medical Decision Making  Medically screening exam initiated at 12:55 PM.  Appropriate orders placed.  Sierra Wiley was informed that the remainder of the evaluation will be completed by another provider, this initial triage assessment does not replace that evaluation, and the importance of remaining in the ED until their evaluation is complete.  Patient to the ED for evaluation of acute on chronic low back pain patient denies any significant injury or trauma.  No bladder or bowel incontinence noted.   Lissa Hoard, PA-C 05/19/23 1256

## 2023-05-19 NOTE — Discharge Instructions (Addendum)
You can take Robaxin at night before bed. Do not drive a vehicle with Robaxin in your system or operate other heavy machinery. You have also been given a refill of your albuterol inhaler. Please keep your appointment with your PCP.

## 2023-05-19 NOTE — ED Provider Notes (Signed)
[facility] Provider Note  Patient Contact: 3:47 PM (approximate)   History   Back Pain   HPI  Sierra Wiley is a 52 y.o. female presents to the emergency department with bilateral low back pain with nausea and radiation of pain into the lower extremities.  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  She has no epigastric abdominal pain or vomiting.  No radiation of pain to the chest.  She denies chest pain, chest tightness or shortness of breath.  No fever.  No bowel or bladder incontinence or saddle anesthesia.      Physical Exam   Triage Vital Signs: ED Triage Vitals  Enc Vitals Group     BP 05/19/23 1246 (!) 141/101     Pulse Rate 05/19/23 1246 78     Resp 05/19/23 1246 18     Temp 05/19/23 1246 98.3 F (36.8 C)     Temp Source 05/19/23 1246 Oral     SpO2 05/19/23 1246 98 %     Weight 05/19/23 1247 199 lb 1.2 oz (90.3 kg)     Height 05/19/23 1247 5\' 4"  (1.626 m)     Head Circumference --      Peak Flow --      Pain Score 05/19/23 1246 8     Pain Loc --      Pain Edu? --      Excl. in [place]? --     Most recent vital signs: Vitals:   05/19/23 1246  BP: (!) 141/101  Pulse: 78  Resp: 18  Temp: 98.3 F (36.8 C)  SpO2: 98%     General: Alert and in no acute distress. Eyes:  PERRL. EOMI. Head: No acute traumatic findings ENT:      Nose: No congestion/rhinnorhea.      Mouth/Throat: Mucous membranes are moist. Neck: No stridor. No cervical spine tenderness to palpation. Cardiovascular:  Good peripheral perfusion Respiratory: Normal respiratory effort without tachypnea or retractions. Lungs CTAB. Good air entry to the bases with no decreased or absent breath sounds. Gastrointestinal: Bowel sounds 4 quadrants. Soft and nontender to palpation. No guarding or rigidity. No palpable masses. No distention. No CVA tenderness. Musculoskeletal: Full range of  motion to all extremities. Patient has paraspinal muscle tenderness along the lumbar spine.  Neurologic:  No gross focal neurologic deficits are appreciated.  Skin:   No rash noted Other:   ED Results / Procedures / Treatments   Labs (all labs ordered are listed, but only abnormal results are displayed) Labs Reviewed - No data to display     PROCEDURES:  Critical Care performed: No  Procedures   MEDICATIONS ORDERED IN ED: Medications  HYDROcodone-acetaminophen (NORCO/VICODIN) 5-325 MG per tablet 1 tablet (has no administration in time range)  ondansetron (ZOFRAN-ODT) disintegrating tablet 4 mg (has no administration in time range)     IMPRESSION / MDM / ASSESSMENT AND PLAN / ED COURSE  I reviewed the triage vital signs and the nursing notes.                              Assessment and plan Low back pain 52 year old female presents to the emergency department with low back pain with paraspinal muscle tenderness.  Patient was hypertensive at triage but vital signs were otherwise reassuring.  On exam, patient was alert and nontoxic-appearing  with no neurodeficits appreciated.  Patient was started on Robaxin at night before bed.  Patient has a history of recent pancreatic resection and splenectomy and has history of diabetes.  Do not feel comfortable starting her on NSAID at this time.  Patient was given 1 dose of Norco while in the emergency department.  All patient questions were answered.     FINAL CLINICAL IMPRESSION(S) / ED DIAGNOSES   Final diagnoses:  Acute bilateral low back pain without sciatica     Rx / DC Orders   ED Discharge Orders          Ordered    methocarbamol (ROBAXIN) 500 MG tablet  Every 8 hours PRN        05/19/23 1543    albuterol (VENTOLIN HFA) 108 (90 Base) MCG/ACT inhaler  Every 6 hours PRN        05/19/23 1543             Note:  This document was prepared using Dragon voice recognition software and may include unintentional  dictation errors.   Pia Mau [place], PA-C 05/19/23 1549    Concha Se, MD 05/21/23 [phone]

## 2023-07-08 ENCOUNTER — Other Ambulatory Visit: Payer: Self-pay

## 2023-07-08 ENCOUNTER — Encounter: Payer: Self-pay | Admitting: Emergency Medicine

## 2023-07-08 ENCOUNTER — Emergency Department: Payer: MEDICAID

## 2023-07-08 ENCOUNTER — Emergency Department
Admission: EM | Admit: 2023-07-08 | Discharge: 2023-07-08 | Disposition: A | Discharge status: Home / Self Care | Payer: MEDICAID | Attending: Emergency Medicine | Admitting: Emergency Medicine

## 2023-07-08 DIAGNOSIS — X58XXXA Exposure to other specified factors, initial encounter: Secondary | ICD-10-CM | POA: Insufficient documentation

## 2023-07-08 DIAGNOSIS — E119 Type 2 diabetes mellitus without complications: Secondary | ICD-10-CM | POA: Insufficient documentation

## 2023-07-08 DIAGNOSIS — S99922A Unspecified injury of left foot, initial encounter: Secondary | ICD-10-CM | POA: Diagnosis present

## 2023-07-08 DIAGNOSIS — S90122A Contusion of left lesser toe(s) without damage to nail, initial encounter: Secondary | ICD-10-CM | POA: Insufficient documentation

## 2023-07-08 MED ORDER — MELOXICAM 15 MG PO TABS: 15.0000 mg | ORAL_TABLET | Freq: Every day | ORAL | 0 refills | Status: AC

## 2023-07-08 NOTE — ED Notes (Signed)
Post op shoe applied 

## 2023-07-08 NOTE — ED Triage Notes (Signed)
First Nurse Note;  Pt via [facility] from home. Pt c/o L pinky toe pain after stubbing her toe. Pt is A&Ox4 and NAD

## 2023-07-08 NOTE — ED Provider Triage Note (Signed)
Emergency Medicine Provider Triage Evaluation Note  Sierra Wiley , a 52 y.o. female  was evaluated in triage.  Pt complains of left pinky toe pain, hit on tree root, now red and swollen and nail is lifted.  Review of Systems  Positive:  Negative:   Physical Exam  BP (!) 121/103 (BP Location: Left Arm)   Pulse 83   Temp 98.8 F (37.1 C) (Oral)   Resp 18   SpO2 99%  Gen:   Awake, no distress   Resp:  Normal effort  MSK:   Moves extremities without difficulty  Other:    Medical Decision Making  Medically screening exam initiated at 6:19 PM.  Appropriate orders placed.  Sierra Wiley was informed that the remainder of the evaluation will be completed by another provider, this initial triage assessment does not replace that evaluation, and the importance of remaining in the ED until their evaluation is complete.     Faythe Ghee, PA-C 07/08/23 1819

## 2023-07-08 NOTE — ED Provider Notes (Signed)
[facility] Provider Note  Patient Contact: 10:56 PM (approximate)   History   Toe Injury   HPI  Sierra Wiley is a 52 y.o. female who presents emerged part complaining of left small toe pain after stubbing her toe.  Patient has no open wounds.  No deformity.  She is having pain over the area.  She is also diabetic and wants to make sure that this would not lead to complications down the road.  No other complaints.     Physical Exam   Triage Vital Signs: ED Triage Vitals [07/08/23 1818]  Enc Vitals Group     BP (!) 121/103     Pulse Rate 83     Resp 18     Temp 98.8 F (37.1 C)     Temp Source Oral     SpO2 99 %     Weight      Height      Head Circumference      Peak Flow      Pain Score 7     Pain Loc      Pain Edu?      Excl. in [place]?     Most recent vital signs: Vitals:   07/08/23 1818  BP: (!) 121/103  Pulse: 83  Resp: 18  Temp: 98.8 F (37.1 C)  SpO2: 99%     General: Alert and in no acute distress.  Cardiovascular:  Good peripheral perfusion Respiratory: Normal respiratory effort without tachypnea or retractions. Lungs CTAB.  Musculoskeletal: Full range of motion to all extremities.  Slight edema noted to the little toe of the left foot.  No open wounds.  Tender over the toe itself.  Without palpable abnormality.  Sensation cap refill intact. Neurologic:  No gross focal neurologic deficits are appreciated.  Skin:   No rash noted Other:   ED Results / Procedures / Treatments   Labs (all labs ordered are listed, but only abnormal results are displayed) Labs Reviewed - No data to display   EKG     RADIOLOGY  I personally viewed, evaluated, and interpreted these images as part of my medical decision making, as well as reviewing the written report by the radiologist.  ED Provider Interpretation: No acute traumatic finding on x-ray.  DG Foot Complete Left  Result Date: 07/08/2023 CLINICAL DATA:  Trauma, pain  EXAM: LEFT FOOT - COMPLETE 3+ VIEW COMPARISON:  None Available. FINDINGS: No recent fracture or dislocation is seen. Small bony spurs are seen in first metatarsophalangeal joint. Smooth marginated calcifications are noted inseparable from the lateral cortical margins of bases of proximal phalanges in fourth and fifth toes. This may be a normal variation or residual change from previous injury. Small plantar spur is seen in calcaneus. IMPRESSION: No recent fracture or dislocation is seen. Degenerative changes with bony spurs are noted in first metatarsophalangeal joint. Cortical protuberances in the bases of proximal phalanges of fourth and fifth toes may be residual from previous injury. Electronically Signed   By: Ernie Avena M.D.   On: 07/08/2023 19:14    PROCEDURES:  Critical Care performed: No  Procedures   MEDICATIONS ORDERED IN ED: Medications - No data to display   IMPRESSION / MDM / ASSESSMENT AND PLAN / ED COURSE  I reviewed the triage vital signs and the nursing notes.  Differential diagnosis includes, but is not limited to, foot contusion, foot laceration, broken digit  Patient's presentation is most consistent with acute presentation with potential threat to life or bodily function.   Patient's diagnosis is consistent with toe contusion.  Patient presents emergency department after stubbing her little toe on the left foot.  No open wounds.  X-ray was reassuring with no acute fracture.  Postop shoe for symptom improvement.  Patient be placed on anti-inflammatory.  Follow-up with primary care as needed..  Patient is given ED precautions to return to the ED for any worsening or new symptoms.     FINAL CLINICAL IMPRESSION(S) / ED DIAGNOSES   Final diagnoses:  Contusion of lesser toe of left foot without damage to nail, initial encounter     Rx / DC Orders   ED Discharge Orders          Ordered    meloxicam (MOBIC) 15 MG tablet   Daily        07/08/23 2256             Note:  This document was prepared using Dragon voice recognition software and may include unintentional dictation errors.   Lanette Hampshire 07/08/23 2258    Pilar Jarvis, MD 07/09/23 [phone]

## 2023-08-04 ENCOUNTER — Other Ambulatory Visit: Payer: Self-pay | Admitting: Internal Medicine

## 2023-08-04 DIAGNOSIS — N939 Abnormal uterine and vaginal bleeding, unspecified: Secondary | ICD-10-CM

## 2023-08-05 ENCOUNTER — Ambulatory Visit
Admission: RE | Admit: 2023-08-05 | Discharge: 2023-08-05 | Disposition: A | Discharge status: Home / Self Care | Payer: MEDICAID | Source: Ambulatory Visit | Attending: Internal Medicine | Admitting: Internal Medicine

## 2023-08-05 DIAGNOSIS — N939 Abnormal uterine and vaginal bleeding, unspecified: Secondary | ICD-10-CM | POA: Diagnosis present

## 2023-10-22 LAB — GLUCOSE, POCT (MANUAL RESULT ENTRY): POC Glucose: 271 mg/dL — AB (ref 70–99)

## 2023-10-23 NOTE — Congregational Nurse Program (Signed)
  Dept: [phone]   Congregational Nurse Program Note  Date of Encounter: 10/22/2023 [facility] 10/[facility]/23 requesting glucose screening. States she had 1/2 pancreas removal in 2022, takes insulin tid and at bedtime, and has follow up with dr scheduled in 3 weeks. Has insurance. PCP is [facility]. Has not taken noon dose of insulin and is not fasting.  Glucose 271. Co regarding results. States she checks her glucose regularly at home but not yet today. Encouraged rechecks and call dr prior to the 3 week follow up appt that is already scheduled if continues to run high. Reinforced diabetic diet and consistency in taking insulin at meal times. Agrees to take lunch insulin asap today (at ~2 pm) and to follow up plan discussed. Per client's request, There were no vitals taken for this visit. Return to nurse clinic prn. Rhermann, RN  Past Medical History: Past Medical History:  Diagnosis Date   Asthma    Bipolar 1 disorder (HCC)    Cholelithiases    Chronic back pain    Depression    Opiate use 02/21/2016   Pancreatitis    PTSD (post-traumatic stress disorder)    Seizures (HCC)     Encounter Details:  Community Questionnaire - 10/22/23 1400       Questionnaire   Ask client: Do you give verbal consent for me to treat you today? Yes    Student Assistance N/A    Location Patient Information systems manager, [employer]    Encounter Setting CN site    Population Status Unknown    Insurance/Financial Assistance Referral Medicaid    Medication N/A    Medical Provider Yes    Screening Referrals Made N/A    Medical Referrals Made Non-[facility] PCP/Clinic    Medical Appointment Completed N/A    CNP Interventions Educate;Advocate/Support    Screenings CN Performed Blood Glucose    ED Visit Averted N/A    Life-Saving Intervention Made N/A

## 2023-11-10 ENCOUNTER — Encounter: Payer: Self-pay | Admitting: Family Medicine

## 2023-11-10 DIAGNOSIS — Z1231 Encounter for screening mammogram for malignant neoplasm of breast: Secondary | ICD-10-CM

## 2023-11-17 ENCOUNTER — Other Ambulatory Visit: Payer: Self-pay | Admitting: Family Medicine

## 2023-11-17 DIAGNOSIS — Z1231 Encounter for screening mammogram for malignant neoplasm of breast: Secondary | ICD-10-CM

## 2024-05-18 ENCOUNTER — Emergency Department
Admission: EM | Admit: 2024-05-18 | Discharge: 2024-05-18 | Disposition: A | Discharge status: Home / Self Care | Payer: Self-pay | Attending: Emergency Medicine | Admitting: Emergency Medicine

## 2024-05-18 ENCOUNTER — Other Ambulatory Visit: Payer: Self-pay

## 2024-05-18 DIAGNOSIS — E1165 Type 2 diabetes mellitus with hyperglycemia: Secondary | ICD-10-CM | POA: Insufficient documentation

## 2024-05-18 DIAGNOSIS — Z794 Long term (current) use of insulin: Secondary | ICD-10-CM | POA: Insufficient documentation

## 2024-05-18 DIAGNOSIS — E876 Hypokalemia: Secondary | ICD-10-CM | POA: Insufficient documentation

## 2024-05-18 DIAGNOSIS — Z7984 Long term (current) use of oral hypoglycemic drugs: Secondary | ICD-10-CM | POA: Insufficient documentation

## 2024-05-18 DIAGNOSIS — R739 Hyperglycemia, unspecified: Secondary | ICD-10-CM

## 2024-05-18 DIAGNOSIS — I10 Essential (primary) hypertension: Secondary | ICD-10-CM | POA: Insufficient documentation

## 2024-05-18 LAB — CBC WITH DIFFERENTIAL/PLATELET
Abs Immature Granulocytes: 0.01 10*3/uL (ref 0.00–0.07)
Basophils Absolute: 0 10*3/uL (ref 0.0–0.1)
Basophils Relative: 1 %
Eosinophils Absolute: 0.1 10*3/uL (ref 0.0–0.5)
Eosinophils Relative: 1 %
HCT: 37.5 % (ref 36.0–46.0)
Hemoglobin: 12.6 g/dL (ref 12.0–15.0)
Immature Granulocytes: 0 %
Lymphocytes Relative: 63 %
Lymphs Abs: 5.3 10*3/uL — ABNORMAL HIGH (ref 0.7–4.0)
MCH: 29 pg (ref 26.0–34.0)
MCHC: 33.6 g/dL (ref 30.0–36.0)
MCV: 86.2 fL (ref 80.0–100.0)
Monocytes Absolute: 0.6 10*3/uL (ref 0.1–1.0)
Monocytes Relative: 7 %
Neutro Abs: 2.4 10*3/uL (ref 1.7–7.7)
Neutrophils Relative %: 28 %
Platelets: 347 10*3/uL (ref 150–400)
RBC: 4.35 MIL/uL (ref 3.87–5.11)
RDW: 14.9 % (ref 11.5–15.5)
WBC: 8.5 10*3/uL (ref 4.0–10.5)
nRBC: 0 % (ref 0.0–0.2)

## 2024-05-18 LAB — URINALYSIS, ROUTINE W REFLEX MICROSCOPIC
Bacteria, UA: NONE SEEN
Bilirubin Urine: NEGATIVE
Glucose, UA: >=500 mg/dL — AB
Hgb urine dipstick: NEGATIVE
Ketones, ur: 20 mg/dL — AB
Leukocytes,Ua: NEGATIVE
Nitrite: NEGATIVE
Protein, ur: NEGATIVE mg/dL
Specific Gravity, Urine: 1.024 (ref 1.005–1.030)
pH: 7 (ref 5.0–8.0)

## 2024-05-18 LAB — COMPREHENSIVE METABOLIC PANEL WITH GFR
ALT: 15 U/L (ref 0–44)
AST: 17 U/L (ref 15–41)
Albumin: 4.1 g/dL (ref 3.5–5.0)
Alkaline Phosphatase: 67 U/L (ref 38–126)
Anion gap: 14 (ref 5–15)
BUN: 8 mg/dL (ref 6–20)
CO2: 24 mmol/L (ref 22–32)
Calcium: 9.3 mg/dL (ref 8.9–10.3)
Chloride: 97 mmol/L — ABNORMAL LOW (ref 98–111)
Creatinine, Ser: 0.59 mg/dL (ref 0.44–1.00)
GFR, Estimated: >60 mL/min (ref >60)
Glucose, Bld: 314 mg/dL — ABNORMAL HIGH (ref 70–99)
Potassium: 3.3 mmol/L — ABNORMAL LOW (ref 3.5–5.1)
Sodium: 135 mmol/L (ref 135–145)
Total Bilirubin: 0.9 mg/dL (ref 0.0–1.2)
Total Protein: 7.2 g/dL (ref 6.5–8.1)

## 2024-05-18 LAB — CBG MONITORING, ED: Glucose-Capillary: 306 mg/dL — ABNORMAL HIGH (ref 70–99)

## 2024-05-18 LAB — BETA-HYDROXYBUTYRIC ACID: Beta-Hydroxybutyric Acid: 1.08 mmol/L — ABNORMAL HIGH (ref 0.05–0.27)

## 2024-05-18 MED ORDER — ONDANSETRON HCL 4 MG/2ML IJ SOLN
4.0000 mg | Freq: Once | INTRAMUSCULAR | Status: AC
  Administered 2024-05-18: 4 mg via INTRAVENOUS
  Filled 2024-05-18: qty 2

## 2024-05-18 MED ORDER — SODIUM CHLORIDE 0.9 % IV BOLUS
1000.0000 mL | Freq: Once | INTRAVENOUS | Status: AC
  Administered 2024-05-18: 1000 mL via INTRAVENOUS

## 2024-05-18 NOTE — ED Triage Notes (Signed)
 Pt was at PCP and her glucose was high with ketones in her urine. She reports 15 pound weight loss over past month. PCP called EMS to bring her in for evaluation for DKA. Pt says she hasn't been on her insulin or metformin "in a long time." Pt reports polydipsia and polyuria.   CBG 306 in triage.

## 2024-05-18 NOTE — ED Notes (Signed)
 First nurse note: To ED from PCP office via [facility] for nausea (no vomiting) and high BG (425 at PCP, 322 with EMS). PCP dia UA which showed ketones Has been off all meds including insulin for DM2 since 2 months Denies pain. Alert, oriented and ambulatory.  No IV, tough stick 139/90, HR 60s, NSR on EKG, RR 20, CBG 322

## 2024-05-18 NOTE — ED Provider Notes (Signed)
 [facility] Provider Note    Event Date/Time   First MD Initiated Contact with Patient 05/18/24 1716     (approximate)   History   Hyperglycemia   HPI George Alcantar is a 53 y.o. female with history of bipolar 1, DM2, HTN presenting today for hyperglycemia.  She reports she was at her PCP office and her glucose was high with ketones in her urine.  PCP sent her to the ED to evaluate for signs of DKA.  Patient states she has not been on her insulin or metformin for "a long time".  She has been having frequent urinations.  Denies any abdominal pain.  Intermittently has nausea but states that is chronic.  No diarrhea or constipation.  No fevers or chills.  Seen at her clinic today and reviewed paperwork that she came with.  She has been out of her insulin and metformin but they have represcribed both his medications for her to pick up tomorrow if she is discharged today.     Physical Exam   Triage Vital Signs: ED Triage Vitals  Encounter Vitals Group     BP 05/18/24 1648 (!) 134/101     Systolic BP Percentile --      Diastolic BP Percentile --      Pulse Rate 05/18/24 1648 83     Resp 05/18/24 1648 17     Temp 05/18/24 1648 99.1 F (37.3 C)     Temp Source 05/18/24 1648 Oral     SpO2 05/18/24 1648 98 %     Weight 05/18/24 1649 202 lb (91.6 kg)     Height 05/18/24 1649 5\' 4"  (1.626 m)     Head Circumference --      Peak Flow --      Pain Score 05/18/24 1649 0     Pain Loc --      Pain Education --      Exclude from Growth Chart --     Most recent vital signs: Vitals:   05/18/24 1648  BP: (!) 134/101  Pulse: 83  Resp: 17  Temp: 99.1 F (37.3 C)  SpO2: 98%   Physical Exam: I have reviewed the vital signs and nursing notes. General: Awake, alert, no acute distress.  Nontoxic appearing. Head:  Atraumatic, normocephalic.   ENT:  EOM intact, PERRL. Oral mucosa is pink and moist with no lesions. Neck: Neck is supple with full range of motion, No  meningeal signs. Cardiovascular:  RRR, No murmurs. Peripheral pulses palpable and equal bilaterally. Respiratory:  Symmetrical chest wall expansion.  No rhonchi, rales, or wheezes.  Good air movement throughout.  No use of accessory muscles.   Musculoskeletal:  No cyanosis or edema. Moving extremities with full ROM Abdomen:  Soft, nontender, nondistended. Neuro:  GCS 15, moving all four extremities, interacting appropriately. Speech clear. Psych:  Calm, appropriate.   Skin:  Warm, dry, no rash.    ED Results / Procedures / Treatments   Labs (all labs ordered are listed, but only abnormal results are displayed) Labs Reviewed  CBC WITH DIFFERENTIAL/PLATELET - Abnormal; Notable for the following components:      Result Value   Lymphs Abs 5.3 (*)    All other components within normal limits  COMPREHENSIVE METABOLIC PANEL WITH GFR - Abnormal; Notable for the following components:   Potassium 3.3 (*)    Chloride 97 (*)    Glucose, Bld 314 (*)    All other components within normal limits  BETA-HYDROXYBUTYRIC  ACID - Abnormal; Notable for the following components:   Beta-Hydroxybutyric Acid 1.08 (*)    All other components within normal limits  URINALYSIS, ROUTINE W REFLEX MICROSCOPIC - Abnormal; Notable for the following components:   Color, Urine STRAW (*)    APPearance CLEAR (*)    Glucose, UA >=500 (*)    Ketones, ur 20 (*)    All other components within normal limits  CBG MONITORING, ED - Abnormal; Notable for the following components:   Glucose-Capillary 306 (*)    All other components within normal limits     EKG    RADIOLOGY    PROCEDURES:  Critical Care performed: No  Procedures   MEDICATIONS ORDERED IN ED: Medications  sodium chloride  0.9 % bolus 1,000 mL (1,000 mLs Intravenous New Bag/Given 05/18/24 1747)  ondansetron  (ZOFRAN ) injection 4 mg (4 mg Intravenous Given 05/18/24 1747)     IMPRESSION / MDM / ASSESSMENT AND PLAN / ED COURSE  I reviewed the  triage vital signs and the nursing notes.                              Differential diagnosis includes, but is not limited to, hyperglycemia, out of medication, DKA  Patient's presentation is most consistent with exacerbation of chronic illness.  Patient is a 53 year old female who has been out of her insulin and metformin presenting today for hyperglycemia.  Seen originally at her PCP office where she was hyperglycemic into the 400s and reportedly had ketones in her urine.  Sent for DKA rule out.  On arrival her vital signs are stable and physical exam is unremarkable with no abdominal pain.  Laboratory workup shows slight hyperglycemia at 314 but absolutely no evidence of DKA.  Patient given 1 L fluids and Zofran .  Bicarb is normal.  Minimal ketones in urine.  Patient otherwise tolerating p.o. and continues to stay stable on her vital signs here.  Will discharge for hyperglycemia.  Will have prescription sent in by her PCP that she is picking up from the pharmacy tomorrow.  Given strict return precautions.  The patient is on the cardiac monitor to evaluate for evidence of arrhythmia and/or significant heart rate changes.     FINAL CLINICAL IMPRESSION(S) / ED DIAGNOSES   Final diagnoses:  Hyperglycemia     Rx / DC Orders   ED Discharge Orders     None        Note:  This document was prepared using Dragon voice recognition software and may include unintentional dictation errors.   Kandee Orion, MD 05/18/24 [phone]

## 2024-05-18 NOTE — Discharge Instructions (Signed)
 While your blood sugar is elevated today you have no serious side effects from it.  Please follow-up and get your medications from the pharmacy tomorrow and take as prescribed.  Please return for any severe or worsening symptoms.

## 2024-06-10 ENCOUNTER — Emergency Department
Admission: EM | Admit: 2024-06-10 | Discharge: 2024-06-10 | Disposition: A | Discharge status: Home / Self Care | Payer: Self-pay | Attending: Emergency Medicine | Admitting: Emergency Medicine

## 2024-06-10 ENCOUNTER — Other Ambulatory Visit: Payer: Self-pay

## 2024-06-10 DIAGNOSIS — R21 Rash and other nonspecific skin eruption: Secondary | ICD-10-CM | POA: Insufficient documentation

## 2024-06-10 MED ORDER — PERMETHRIN 5 % EX CREA: 1.0000 | TOPICAL_CREAM | Freq: Once | CUTANEOUS | 0 refills | Status: AC

## 2024-06-10 MED ORDER — PERMETHRIN 0.5 % AERO: INHALATION_SPRAY | Freq: Once | 0 refills | Status: DC

## 2024-06-10 NOTE — ED Provider Notes (Signed)
 [facility] Provider Note    Event Date/Time   First MD Initiated Contact with Patient 06/10/24 1007     (approximate)   History   Rash   HPI Sierra Wiley is a 53 y.o. female presenting today for rash.  Patient states onset of symptoms a couple of days ago in her lower extremities.  Today she started noticing linear marks on her arms like something was crawling on her.  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  Denies symptoms similar to this in the past.  Denies any other acute symptoms outside of rash and itchiness.     Physical Exam   Triage Vital Signs: ED Triage Vitals [06/10/24 1014]  Encounter Vitals Group     BP (!) 173/98     Girls Systolic BP Percentile      Girls Diastolic BP Percentile      Boys Systolic BP Percentile      Boys Diastolic BP Percentile      Pulse Rate 82     Resp 16     Temp 98.6 F (37 C)     Temp Source Oral     SpO2 100 %     Weight      Height 5' 4 (1.626 m)     Head Circumference      Peak Flow      Pain Score 0     Pain Loc      Pain Education      Exclude from Growth Chart     Most recent vital signs: Vitals:   06/10/24 1014  BP: (!) 173/98  Pulse: 82  Resp: 16  Temp: 98.6 F (37 C)  SpO2: 100%   I have reviewed the vital signs. General:  Awake, alert, no acute distress. Head:  Normocephalic, Atraumatic. EENT:  PERRL, EOMI, Oral mucosa pink and moist, Neck is supple. Cardiovascular: Regular rate, 2+ distal pulses. Respiratory:  Normal respiratory effort, symmetrical expansion, no distress.   Extremities:  Moving all four extremities through full ROM without pain.   Neuro:  Alert and oriented.  Interacting appropriately.   Skin: Macular papular rash to bilateral lower extremities around the ankles in a slightly linear fashion.  Several areas of linear lesions to bilateral arms Psych: Appropriate affect.    ED Results / Procedures / Treatments   Labs (all labs ordered are  listed, but only abnormal results are displayed) Labs Reviewed - No data to display   EKG    RADIOLOGY    PROCEDURES:  Critical Care performed: No  Procedures   MEDICATIONS ORDERED IN ED: Medications - No data to display   IMPRESSION / MDM / ASSESSMENT AND PLAN / ED COURSE  I reviewed the triage vital signs and the nursing notes.                              Differential diagnosis includes, but is not limited to, scabies, allergic reaction, maculopapular rash  Patient's presentation is most consistent with acute, uncomplicated illness.  Patient is a 53 year old female presenting today for rash to her bilateral ankles and arms.  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  There are some evidence of linear raised lesions on her arms and legs.  This seems most consistent with possible scabies given the itchiness.  Will give her a dose of permethrin cream to go home  with and recommend she clean all of her close as well as treat other family members at the house.  Told to follow-up with dermatology and her PCP for reevaluation to ensure resolution.  Given strict return precautions.     FINAL CLINICAL IMPRESSION(S) / ED DIAGNOSES   Final diagnoses:  Rash     Rx / DC Orders   ED Discharge Orders          Ordered    pyrethrins-piperonyl butoxide 0.5 % bottle   Once        06/10/24 1019    permethrin (ELIMITE) 5 % cream   Once        06/10/24 1019    Ambulatory referral to Dermatology        06/10/24 1020             Note:  This document was prepared using Dragon voice recognition software and may include unintentional dictation errors.   Kandee Orion, MD 06/10/24 1022

## 2024-06-10 NOTE — ED Triage Notes (Signed)
 Pt to ED via [facility] for rash that started x2 weeks that has gotten worse. Pt reports that she is having an itching and crawling sensations that are worse at night. Pt reports having chickens and a dog. No further complaints. Pt denies fever, N/V.  EMS vitals CBG 188 SPO2 100 Heart rate 93

## 2024-06-10 NOTE — Discharge Instructions (Signed)
 Your rash at this time appears most likely consistent with scabies.  I have sent a medication to your pharmacy to treat this. Please apply throughout entire body and leave on for 8 to 12 hours before washing.  All other family members in the household should do the same.  You should likely only need 1 treatment but, if you do not have resolution in 1 to 2 weeks, please repeat.  Please follow-up with dermatology or your primary care provider for reassessment in 1 to 2 weeks.  Please return for any severe or worsening symptoms.

## 2024-07-03 ENCOUNTER — Other Ambulatory Visit: Payer: Self-pay

## 2024-07-03 ENCOUNTER — Emergency Department
Admission: EM | Admit: 2024-07-03 | Discharge: 2024-07-03 | Disposition: A | Discharge status: Home / Self Care | Payer: Self-pay | Attending: Emergency Medicine | Admitting: Emergency Medicine

## 2024-07-03 DIAGNOSIS — J45909 Unspecified asthma, uncomplicated: Secondary | ICD-10-CM | POA: Insufficient documentation

## 2024-07-03 DIAGNOSIS — F419 Anxiety disorder, unspecified: Secondary | ICD-10-CM | POA: Insufficient documentation

## 2024-07-03 DIAGNOSIS — R002 Palpitations: Secondary | ICD-10-CM | POA: Insufficient documentation

## 2024-07-03 LAB — CBC WITH DIFFERENTIAL/PLATELET
Abs Immature Granulocytes: 0.02 K/uL (ref 0.00–0.07)
Basophils Absolute: 0 K/uL (ref 0.0–0.1)
Basophils Relative: 0 %
Eosinophils Absolute: 0.2 K/uL (ref 0.0–0.5)
Eosinophils Relative: 2 %
HCT: 36.5 % (ref 36.0–46.0)
Hemoglobin: 12.1 g/dL (ref 12.0–15.0)
Immature Granulocytes: 0 %
Lymphocytes Relative: 53 %
Lymphs Abs: 4.6 K/uL — ABNORMAL HIGH (ref 0.7–4.0)
MCH: 29.5 pg (ref 26.0–34.0)
MCHC: 33.2 g/dL (ref 30.0–36.0)
MCV: 89 fL (ref 80.0–100.0)
Monocytes Absolute: 0.6 K/uL (ref 0.1–1.0)
Monocytes Relative: 6 %
Neutro Abs: 3.5 K/uL (ref 1.7–7.7)
Neutrophils Relative %: 39 %
Platelets: 381 K/uL (ref 150–400)
RBC: 4.1 MIL/uL (ref 3.87–5.11)
RDW: 14.7 % (ref 11.5–15.5)
WBC: 8.9 K/uL (ref 4.0–10.5)
nRBC: 0 % (ref 0.0–0.2)

## 2024-07-03 LAB — COMPREHENSIVE METABOLIC PANEL WITH GFR
ALT: 12 U/L (ref 0–44)
AST: 15 U/L (ref 15–41)
Albumin: 3.7 g/dL (ref 3.5–5.0)
Alkaline Phosphatase: 73 U/L (ref 38–126)
Anion gap: 10 (ref 5–15)
BUN: 12 mg/dL (ref 6–20)
CO2: 24 mmol/L (ref 22–32)
Calcium: 9.2 mg/dL (ref 8.9–10.3)
Chloride: 101 mmol/L (ref 98–111)
Creatinine, Ser: 0.71 mg/dL (ref 0.44–1.00)
GFR, Estimated: >60 mL/min (ref >60)
Glucose, Bld: 354 mg/dL — ABNORMAL HIGH (ref 70–99)
Potassium: 3.5 mmol/L (ref 3.5–5.1)
Sodium: 135 mmol/L (ref 135–145)
Total Bilirubin: 0.5 mg/dL (ref 0.0–1.2)
Total Protein: 7.1 g/dL (ref 6.5–8.1)

## 2024-07-03 LAB — TROPONIN I (HIGH SENSITIVITY): Troponin I (High Sensitivity): 2 ng/L (ref <18)

## 2024-07-03 MED ORDER — HYDROXYZINE HCL 25 MG PO TABS
25.0000 mg | ORAL_TABLET | Freq: Once | ORAL | Status: AC
  Administered 2024-07-03: 25 mg via ORAL
  Filled 2024-07-03: qty 1

## 2024-07-03 MED ORDER — LORAZEPAM 1 MG PO TABS
1.0000 mg | ORAL_TABLET | Freq: Once | ORAL | Status: AC
  Administered 2024-07-03: 1 mg via ORAL
  Filled 2024-07-03: qty 1

## 2024-07-03 MED ORDER — HYDROXYZINE HCL 25 MG PO TABS: 25.0000 mg | ORAL_TABLET | Freq: Three times a day (TID) | ORAL | 0 refills | Status: AC | PRN

## 2024-07-03 NOTE — ED Notes (Signed)
 DC education reviewed with pt. Pt instructed to take medications as prescribed and to follow up with PCP. Pt instructed to return for worsening symptoms. Pt verbalized understanding. NADN. Pt provided with sandwich. States will wait in lobby for ride. Pt ambulatory with steady gait to lobby.

## 2024-07-03 NOTE — ED Notes (Signed)
 Pt presented to ED with c/o anxiety. States hx of anxiety but does not take medications at home for anxiety. Pt states did take old anxiety medication without relief of symptoms. Unsure of name of medication. States came to ED due to shaking with the anxiety that started today. Denies nausea/ vomiting, SOB.

## 2024-07-03 NOTE — ED Provider Notes (Signed)
 [facility] Provider Note    Event Date/Time   First MD Initiated Contact with Patient 07/03/24 [phone]     (approximate)   History   Anxiety   HPI  Sierra Wiley is a 53 y.o. female history of bipolar disorder, anxiety, PTSD, asthma, seizures, presenting with anxiety.  Patient states that she has been having palpitations, no chest pain, has very slight short of breath, no leg swelling, no cough, headache, fever, nausea, vomiting, diarrhea.  States that she is also having tremors.  She denies any drug or alcohol use, states that her last drink was 3 years ago.  States that this came on because her son is going into the service and she is feeling very anxious.  She denies any SI or HI.  She denies any recent travel or surgeries, no recent falls, no history of blood clots, no unilateral calf pain or tenderness, does not take any hormones, no hemoptysis.  She denies history of malignancies.    On independent review, she was seen by [facility] surgery in 2024, has history of chronic pancreatitis secondary to cholelithiasis status post pancreatectomy and splenectomy she denies any abdominal pain at this time.  Physical Exam   Triage Vital Signs: ED Triage Vitals  Encounter Vitals Group     BP 07/03/24 0028 (!) 122/53     Girls Systolic BP Percentile --      Girls Diastolic BP Percentile --      Boys Systolic BP Percentile --      Boys Diastolic BP Percentile --      Pulse Rate 07/03/24 0028 90     Resp 07/03/24 0028 18     Temp 07/03/24 0028 98 F (36.7 C)     Temp src --      SpO2 07/03/24 0028 99 %     Weight 07/03/24 0029 199 lb (90.3 kg)     Height 07/03/24 0029 5' 4 (1.626 m)     Head Circumference --      Peak Flow --      Pain Score 07/03/24 0031 0     Pain Loc --      Pain Education --      Exclude from Growth Chart --     Most recent vital signs: Vitals:   07/03/24 0300 07/03/24 0330  BP: (!) 157/94 (!) 169/80  Pulse:  (!) 116  Resp: 17 15   Temp:    SpO2:  100%     General: Awake, no distress.  CV:  Good peripheral perfusion.  Resp:  Normal effort.  Clear Abd:  No distention.  Soft nontender Other:  Dry mucous membranes, no tongue fasciculations, no hand tremors, no unilateral calf swing or tenderness   ED Results / Procedures / Treatments   Labs (all labs ordered are listed, but only abnormal results are displayed) Labs Reviewed  CBC WITH DIFFERENTIAL/PLATELET - Abnormal; Notable for the following components:      Result Value   Lymphs Abs 4.6 (*)    All other components within normal limits  COMPREHENSIVE METABOLIC PANEL WITH GFR - Abnormal; Notable for the following components:   Glucose, Bld 354 (*)    All other components within normal limits  TROPONIN I (HIGH SENSITIVITY)     EKG  EKG shows, sinus tachycardia, rate 125, normal QRS, normal QTc, intraventricular conduction delay, no obvious ischemic ST elevation, not significantly compared to prior     PROCEDURES:  Critical Care performed: No  Procedures   MEDICATIONS ORDERED IN ED: Medications  hydrOXYzine  (ATARAX ) tablet 25 mg (25 mg Oral Given 07/03/24 0255)  LORazepam  (ATIVAN ) tablet 1 mg (1 mg Oral Given 07/03/24 0319)     IMPRESSION / MDM / ASSESSMENT AND PLAN / ED COURSE  I reviewed the triage vital signs and the nursing notes.                              Differential diagnosis includes, but is not limited to, anxiety, panic attack, atypical ACS, arrhythmia, electrolyte derangements, dehydration.  She denies any urinary symptoms or any other infectious symptoms.  Considered but doubt PE given that she has no other risk factors for it.  Will get labs, EKG, will give her some hydroxyzine  and reassess.  P.o. fluids.  Patient's presentation is most consistent with acute presentation with potential threat to life or bodily function.  Independent interpretation of labs and imaging below.  Patient still feeling anxious, we will give her some  p.o. Ativan  here.  On reassessment after Ativan  patient is feeling significantly better, heart rate is at 100.  Patient denies any symptoms at this time.  Discussed with her about outpatient follow-up, she says that she has a primary care doctor but has not been able to see a psychiatrist because of her lack of health insurance, is agreeable plan to follow-up with primary care doctor to see if she can get started on a long-term medication for her anxiety.  In the meantime we will give her prescription for hydroxyzine .  Considered but no indication for inpatient admission at this time, she is safe for outpatient management.  Will discharge with strict return precautions.  Shared decision making to patient and she is agreeable with this plan.  The patient is on the cardiac monitor to evaluate for evidence of arrhythmia and/or significant heart rate changes.   Clinical Course as of 07/03/24 0407  Sat Jul 03, 2024  0337 Independent review of labs, glucose is 350 but no evidence of acidosis on and gap, electrolytes not severely deranged, LFTs are normal, no leukocytosis. [TT]  0351 Troponin I (High Sensitivity) Not elevated [TT]    Clinical Course User Index [TT] Waymond Lorelle Cummins, MD     FINAL CLINICAL IMPRESSION(S) / ED DIAGNOSES   Final diagnoses:  Anxiety  Palpitations     Rx / DC Orders   ED Discharge Orders          Ordered    hydrOXYzine  (ATARAX ) 25 MG tablet  3 times daily PRN        07/03/24 0406             Note:  This document was prepared using Dragon voice recognition software and may include unintentional dictation errors.     Waymond Lorelle Cummins, MD 07/03/24 [phone]

## 2024-07-03 NOTE — ED Triage Notes (Signed)
 Pt to ed from home via [facility] for anxiety. Pt is caox4, in no acute distress and ambulatory in triage. Pt has no medical complaints other than my anxiety is bothering me.

## 2024-07-03 NOTE — ED Notes (Signed)
 Pt ambulatory to toilet with steady gait. NADN

## 2024-07-03 NOTE — Discharge Instructions (Addendum)
 Please make sure to follow-up with your primary care doctor to get reassessed and to discuss about your anxiety and to see if they need to start you on any medications for it.  You can take the hydroxyzine  every 8 hours as needed for the anxiety.

## 2024-07-03 NOTE — ED Notes (Signed)
 BIB [facility] for anxiety attack. Pt took some expired medication (hydroxyzine ).   190/100 BP 120 HR   Last BP 168/90 308 BGL  98.3 T

## 2024-07-03 NOTE — ED Notes (Signed)
 Per MD, do no repeat troponin

## 2024-11-17 ENCOUNTER — Other Ambulatory Visit: Payer: Self-pay

## 2024-11-17 ENCOUNTER — Emergency Department: Payer: Self-pay

## 2024-11-17 ENCOUNTER — Encounter: Payer: Self-pay | Admitting: Emergency Medicine

## 2024-11-17 ENCOUNTER — Emergency Department
Admission: EM | Admit: 2024-11-17 | Discharge: 2024-11-17 | Disposition: A | Discharge status: Home / Self Care | Payer: Self-pay | Attending: Emergency Medicine | Admitting: Emergency Medicine

## 2024-11-17 DIAGNOSIS — E1165 Type 2 diabetes mellitus with hyperglycemia: Secondary | ICD-10-CM | POA: Insufficient documentation

## 2024-11-17 DIAGNOSIS — R739 Hyperglycemia, unspecified: Secondary | ICD-10-CM

## 2024-11-17 DIAGNOSIS — Z794 Long term (current) use of insulin: Secondary | ICD-10-CM | POA: Insufficient documentation

## 2024-11-17 LAB — CBC WITH DIFFERENTIAL/PLATELET
Abs Immature Granulocytes: 0.01 K/uL (ref 0.00–0.07)
Basophils Absolute: 0 K/uL (ref 0.0–0.1)
Basophils Relative: 0 %
Eosinophils Absolute: 0.1 K/uL (ref 0.0–0.5)
Eosinophils Relative: 2 %
HCT: 37.6 % (ref 36.0–46.0)
Hemoglobin: 12.4 g/dL (ref 12.0–15.0)
Immature Granulocytes: 0 %
Lymphocytes Relative: 61 %
Lymphs Abs: 4.3 K/uL — ABNORMAL HIGH (ref 0.7–4.0)
MCH: 29.3 pg (ref 26.0–34.0)
MCHC: 33 g/dL (ref 30.0–36.0)
MCV: 88.9 fL (ref 80.0–100.0)
Monocytes Absolute: 0.5 K/uL (ref 0.1–1.0)
Monocytes Relative: 7 %
Neutro Abs: 2.1 K/uL (ref 1.7–7.7)
Neutrophils Relative %: 30 %
Platelets: 364 K/uL (ref 150–400)
RBC: 4.23 MIL/uL (ref 3.87–5.11)
RDW: 14.1 % (ref 11.5–15.5)
Smear Review: NORMAL
WBC: 7.2 K/uL (ref 4.0–10.5)
nRBC: 0 % (ref 0.0–0.2)

## 2024-11-17 LAB — COMPREHENSIVE METABOLIC PANEL WITH GFR
ALT: 12 U/L (ref 0–44)
AST: 19 U/L (ref 15–41)
Albumin: 4 g/dL (ref 3.5–5.0)
Alkaline Phosphatase: 85 U/L (ref 38–126)
Anion gap: 13 (ref 5–15)
BUN: 8 mg/dL (ref 6–20)
CO2: 21 mmol/L — ABNORMAL LOW (ref 22–32)
Calcium: 9.1 mg/dL (ref 8.9–10.3)
Chloride: 96 mmol/L — ABNORMAL LOW (ref 98–111)
Creatinine, Ser: 0.82 mg/dL (ref 0.44–1.00)
GFR, Estimated: >60 mL/min (ref >60)
Glucose, Bld: 431 mg/dL — ABNORMAL HIGH (ref 70–99)
Potassium: 4.4 mmol/L (ref 3.5–5.1)
Sodium: 130 mmol/L — ABNORMAL LOW (ref 135–145)
Total Bilirubin: 0.3 mg/dL (ref 0.0–1.2)
Total Protein: 7.1 g/dL (ref 6.5–8.1)

## 2024-11-17 LAB — URINALYSIS, ROUTINE W REFLEX MICROSCOPIC
Bacteria, UA: NONE SEEN
Bilirubin Urine: NEGATIVE
Glucose, UA: >=500 mg/dL — AB
Ketones, ur: NEGATIVE mg/dL
Leukocytes,Ua: NEGATIVE
Nitrite: NEGATIVE
Protein, ur: NEGATIVE mg/dL
Specific Gravity, Urine: 1.028 (ref 1.005–1.030)
WBC, UA: 0 WBC/hpf (ref 0–5)
pH: 6 (ref 5.0–8.0)

## 2024-11-17 LAB — TROPONIN T, HIGH SENSITIVITY: Troponin T High Sensitivity: <15 ng/L (ref 0–19)

## 2024-11-17 LAB — BLOOD GAS, VENOUS
Acid-Base Excess: 1.5 mmol/L (ref 0.0–2.0)
Bicarbonate: 27.2 mmol/L (ref 20.0–28.0)
O2 Saturation: 67.3 %
Patient temperature: 37
pCO2, Ven: 46 mmHg (ref 44–60)
pH, Ven: 7.38 (ref 7.25–7.43)
pO2, Ven: 39 mmHg (ref 32–45)

## 2024-11-17 LAB — RESP PANEL BY RT-PCR (RSV, FLU A&B, COVID)  RVPGX2
Influenza A by PCR: NEGATIVE
Influenza B by PCR: NEGATIVE
Resp Syncytial Virus by PCR: NEGATIVE
SARS Coronavirus 2 by RT PCR: NEGATIVE

## 2024-11-17 LAB — LIPASE, BLOOD: Lipase: 14 U/L (ref 11–51)

## 2024-11-17 LAB — CBG MONITORING, ED
Glucose-Capillary: 420 mg/dL — ABNORMAL HIGH (ref 70–99)
Glucose-Capillary: 156 mg/dL — ABNORMAL HIGH (ref 70–99)

## 2024-11-17 MED ORDER — LACTATED RINGERS IV BOLUS
1000.0000 mL | Freq: Once | INTRAVENOUS | Status: AC
  Administered 2024-11-17: 1000 mL via INTRAVENOUS

## 2024-11-17 MED ORDER — INSULIN ASPART 100 UNIT/ML IJ SOLN
10.0000 [IU] | Freq: Once | INTRAMUSCULAR | Status: AC
  Administered 2024-11-17: 10 [IU] via INTRAVENOUS
  Filled 2024-11-17: qty 10

## 2024-11-17 MED ORDER — ONDANSETRON HCL 4 MG/2ML IJ SOLN
4.0000 mg | Freq: Once | INTRAMUSCULAR | Status: AC
  Administered 2024-11-17: 4 mg via INTRAVENOUS
  Filled 2024-11-17: qty 2

## 2024-11-17 NOTE — Discharge Instructions (Addendum)
 You should talk to your primary care doctor to discuss your insulins.   If your sugars are consistently above 300 at breakfast lunch and dinner then I suspect that you may need higher long-acting insulin.  I would increase your insulin to 18 units to see if this helps and monitor your sugars tomorrow.  Try to keep a log.  That way when you follow-up with your primary care doctor they can decide how to further adjust her insulins.  Make sure that you take another glucose test tonight before going to bed as we do not want you to drop too low and have an issue with hypoglycemia so if below 100 make sure to eat a proper meal and recheck it in after an hour to ensure going back up

## 2024-11-17 NOTE — ED Provider Notes (Signed)
 [facility] Provider Note    Event Date/Time   First MD Initiated Contact with Patient 11/17/24 1555     (approximate)   History   Hyperglycemia (PT to ER via EMS for a blood sugar reading HI - EMS obtained FSBS after NS 500 mls of 542)   HPI  Sierra Wiley is a 53 y.o. female with history of diabetes who comes in with concerns for hyperglycemia.  Patient reports taking 15 units of long-acting insulin  and 6 units of short acting and less insulin  3 times a day.  She reports just not feeling well over the past couple of days.  She reports feeling lightheaded and like her sugars were elevated and she did a glucose check and it was greater than 600.  She reports of increased urination and pain with urination.  She denies any new abdominal pain.  Denies any chest pain, shortness of breath, falls or hitting her head or any other concerns.   Physical Exam   Triage Vital Signs: ED Triage Vitals [11/17/24 1555]  Encounter Vitals Group     BP      Girls Systolic BP Percentile      Girls Diastolic BP Percentile      Boys Systolic BP Percentile      Boys Diastolic BP Percentile      Pulse      Resp      Temp      Temp src      SpO2      Weight 200 lb 9.9 oz (91 kg)     Height 5' 4 (1.626 m)     Head Circumference      Peak Flow      Pain Score 0     Pain Loc      Pain Education      Exclude from Growth Chart     Most recent vital signs: Vitals:   11/17/24 1635 11/17/24 1705  BP: (!) 173/86 (!) 146/71  Pulse: 78 69  Resp: 15 15  Temp:    SpO2: 100% 100%     General: Awake, no distress.  CV:  Good peripheral perfusion.  Resp:  Normal effort.  Abd:  No distention.  Old surgical scar noted.  Soft and nontender Other:     ED Results / Procedures / Treatments   Labs (all labs ordered are listed, but only abnormal results are displayed) Labs Reviewed  CBC WITH DIFFERENTIAL/PLATELET - Abnormal; Notable for the following components:       Result Value   Lymphs Abs 4.3 (*)    All other components within normal limits  COMPREHENSIVE METABOLIC PANEL WITH GFR - Abnormal; Notable for the following components:   Sodium 130 (*)    Chloride 96 (*)    CO2 21 (*)    Glucose, Bld 431 (*)    All other components within normal limits  URINALYSIS, ROUTINE W REFLEX MICROSCOPIC - Abnormal; Notable for the following components:   Color, Urine COLORLESS (*)    APPearance CLEAR (*)    Glucose, UA >=500 (*)    Hgb urine dipstick SMALL (*)    All other components within normal limits  CBG MONITORING, ED - Abnormal; Notable for the following components:   Glucose-Capillary 420 (*)    All other components within normal limits  CBG MONITORING, ED - Abnormal; Notable for the following components:   Glucose-Capillary 156 (*)    All other components within normal limits  RESP  PANEL BY RT-PCR (RSV, FLU A&B, COVID)  RVPGX2  BLOOD GAS, VENOUS  LIPASE, BLOOD  TROPONIN T, HIGH SENSITIVITY     EKG  My interpretation of EKG:  Normal sinus rate of 73 without any ST elevation or T wave inversions, normal intervals  RADIOLOGY I have reviewed the xray personally and interpreted no evidence of any pneumonia   PROCEDURES:  Critical Care performed: Yes, see critical care procedure note(s)  .Critical Care  Performed by: Ernest Ronal BRAVO, MD Authorized by: Ernest Ronal BRAVO, MD   Critical care provider statement:    Critical care time (minutes):  30   Critical care was necessary to treat or prevent imminent or life-threatening deterioration of the following conditions:  Endocrine crisis   Critical care was time spent personally by me on the following activities:  Development of treatment plan with patient or surrogate, discussions with consultants, evaluation of patient's response to treatment, examination of patient, ordering and review of laboratory studies, ordering and review of radiographic studies, ordering and performing treatments and  interventions, pulse oximetry, re-evaluation of patient's condition and review of old charts .1-3 Lead EKG Interpretation  Performed by: Ernest Ronal BRAVO, MD Authorized by: Ernest Ronal BRAVO, MD     Interpretation: normal     ECG rate:  60   ECG rate assessment: normal     Rhythm: sinus rhythm     Ectopy: none     Conduction: normal      MEDICATIONS ORDERED IN ED: Medications  lactated ringers  bolus 1,000 mL (1,000 mLs Intravenous New Bag/Given 11/17/24 1706)  lactated ringers  bolus 1,000 mL (1,000 mLs Intravenous New Bag/Given 11/17/24 1705)  ondansetron  (ZOFRAN ) injection 4 mg (4 mg Intravenous Given 11/17/24 1705)  insulin aspart (novoLOG) injection 10 Units (10 Units Intravenous Given 11/17/24 1744)     IMPRESSION / MDM / ASSESSMENT AND PLAN / ED COURSE  I reviewed the triage vital signs and the nursing notes.   Patient's presentation is most consistent with acute presentation with potential threat to life or bodily function.   Patient comes in with concerns for elevated sugars with some nausea and increased urination.  Will get workup to evaluate for DKA, UTI, COVID, flu, pneumonia.  CBC is reassuring with normal white count CMP shows glucose of 431 but anion gap is normal and her VBG has normal pH Troponin was negative UA without evidence of UTI and no ketones in the urine  5:52 PM repeat evaluation patient reports feeling much improved.  Her abdomen remains soft and nontender.  We discussed and she reports her sugars are typically in the 300-400s at every meal therefore we discussed going up on her long acting insulin from 15 until 18 but she really needs to follow-up with her primary care doctor to make further suggestions for better control of her glucose.  She expressed understanding and felt comfortable with discharge home.  6:57 PM repeat sugar is down to 156 and patient reports feeling much improved.  She has a way to monitor her sugar tonight and is going to still eat  something for before bedtime.  I did recommend that she check blood sugar as well before going to sleep and if is below 100 and make sure she is eating something before going in but the typically the IV insulin strongest because within an hour.  She reports feeling much improved she is up and ambulatory and feels comfortable with discharge home..  The patient is on the cardiac monitor to evaluate  for evidence of arrhythmia and/or significant heart rate changes.      FINAL CLINICAL IMPRESSION(S) / ED DIAGNOSES   Final diagnoses:  Hyperglycemia     Rx / DC Orders   ED Discharge Orders     None        Note:  This document was prepared using Dragon voice recognition software and may include unintentional dictation errors.   Ernest Ronal BRAVO, MD 11/17/24 1901

## 2024-11-17 NOTE — ED Triage Notes (Signed)
 PT to ER via EMS for a blood sugar reading HI - EMS obtained FSBS after NS 500 mls of 542
